# Patient Record
Sex: Female | Born: 1955 | Race: Asian | Hispanic: No | Marital: Married | Smoking: Never smoker
Health system: Southern US, Community
[De-identification: ages and names within clinical notes are randomized; demographics above are authoritative.]

## PROBLEM LIST (undated history)

## (undated) DIAGNOSIS — Z780 Asymptomatic menopausal state: Secondary | ICD-10-CM

## (undated) DIAGNOSIS — T753XXA Motion sickness, initial encounter: Secondary | ICD-10-CM

## (undated) DIAGNOSIS — G5600 Carpal tunnel syndrome, unspecified upper limb: Secondary | ICD-10-CM

## (undated) DIAGNOSIS — K219 Gastro-esophageal reflux disease without esophagitis: Secondary | ICD-10-CM

## (undated) DIAGNOSIS — Z972 Presence of dental prosthetic device (complete) (partial): Secondary | ICD-10-CM

## (undated) DIAGNOSIS — E039 Hypothyroidism, unspecified: Secondary | ICD-10-CM

## (undated) DIAGNOSIS — D649 Anemia, unspecified: Secondary | ICD-10-CM

## (undated) DIAGNOSIS — K589 Irritable bowel syndrome without diarrhea: Secondary | ICD-10-CM

## (undated) DIAGNOSIS — M1990 Unspecified osteoarthritis, unspecified site: Secondary | ICD-10-CM

## (undated) DIAGNOSIS — R0600 Dyspnea, unspecified: Secondary | ICD-10-CM

## (undated) HISTORY — PX: UPPER GI ENDOSCOPY: SHX6162

## (undated) HISTORY — DX: Asymptomatic menopausal state: Z78.0

## (undated) HISTORY — DX: Irritable bowel syndrome, unspecified: K58.9

## (undated) HISTORY — PX: APPENDECTOMY: SHX54

## (undated) HISTORY — DX: Carpal tunnel syndrome, unspecified upper limb: G56.00

---

## 2007-07-01 ENCOUNTER — Emergency Department: Payer: Self-pay | Admitting: Emergency Medicine

## 2010-02-04 ENCOUNTER — Emergency Department: Payer: Self-pay | Admitting: Emergency Medicine

## 2010-04-06 ENCOUNTER — Ambulatory Visit: Payer: Self-pay | Admitting: Gastroenterology

## 2010-04-06 LAB — HM COLONOSCOPY

## 2011-01-23 LAB — HM PAP SMEAR

## 2011-10-15 ENCOUNTER — Ambulatory Visit: Payer: Self-pay | Admitting: Family Medicine

## 2014-05-17 DIAGNOSIS — R0602 Shortness of breath: Secondary | ICD-10-CM | POA: Insufficient documentation

## 2014-05-17 DIAGNOSIS — E785 Hyperlipidemia, unspecified: Secondary | ICD-10-CM | POA: Insufficient documentation

## 2014-05-17 DIAGNOSIS — R002 Palpitations: Secondary | ICD-10-CM | POA: Insufficient documentation

## 2014-05-17 LAB — HM MAMMOGRAPHY

## 2015-06-22 ENCOUNTER — Other Ambulatory Visit: Payer: Self-pay | Admitting: Family Medicine

## 2015-06-22 DIAGNOSIS — E039 Hypothyroidism, unspecified: Secondary | ICD-10-CM

## 2015-06-22 NOTE — Telephone Encounter (Signed)
Due for physical in September. Needs appointment booked. Enough medicine sent in to get to her appointment in September.

## 2015-07-21 DIAGNOSIS — E039 Hypothyroidism, unspecified: Secondary | ICD-10-CM

## 2015-07-21 DIAGNOSIS — G5600 Carpal tunnel syndrome, unspecified upper limb: Secondary | ICD-10-CM

## 2015-07-21 DIAGNOSIS — Z780 Asymptomatic menopausal state: Secondary | ICD-10-CM | POA: Insufficient documentation

## 2015-07-21 DIAGNOSIS — K589 Irritable bowel syndrome without diarrhea: Secondary | ICD-10-CM | POA: Insufficient documentation

## 2015-07-22 ENCOUNTER — Ambulatory Visit (INDEPENDENT_AMBULATORY_CARE_PROVIDER_SITE_OTHER): Payer: No Typology Code available for payment source | Admitting: Unknown Physician Specialty

## 2015-07-22 ENCOUNTER — Encounter: Payer: Self-pay | Admitting: Unknown Physician Specialty

## 2015-07-22 VITALS — BP 135/84 | HR 85 | Temp 98.6°F | Ht 58.5 in | Wt 108.6 lb

## 2015-07-22 DIAGNOSIS — R1030 Lower abdominal pain, unspecified: Secondary | ICD-10-CM | POA: Diagnosis not present

## 2015-07-22 LAB — UA/M W/RFLX CULTURE, ROUTINE
Bilirubin, UA: NEGATIVE
Glucose, UA: NEGATIVE
Ketones, UA: NEGATIVE
LEUKOCYTES UA: NEGATIVE
Nitrite, UA: NEGATIVE
PH UA: 7.5 (ref 5.0–7.5)
PROTEIN UA: NEGATIVE
RBC UA: NEGATIVE
SPEC GRAV UA: 1.015 (ref 1.005–1.030)
Urobilinogen, Ur: 0.2 mg/dL (ref 0.2–1.0)

## 2015-07-22 LAB — CBC WITH DIFFERENTIAL/PLATELET
Hematocrit: 39.7 % (ref 34.0–46.6)
Hemoglobin: 13 g/dL (ref 11.1–15.9)
LYMPHS ABS: 2.9 10*3/uL (ref 0.7–3.1)
Lymphs: 35 %
MCH: 27.2 pg (ref 26.6–33.0)
MCHC: 32.7 g/dL (ref 31.5–35.7)
MCV: 83 fL (ref 79–97)
MID (ABSOLUTE): 0.8 10*3/uL (ref 0.1–1.6)
MID: 10 %
Neutrophils Absolute: 4.6 10*3/uL (ref 1.4–7.0)
Neutrophils: 56 %
PLATELETS: 248 10*3/uL (ref 150–379)
RBC: 4.78 x10E6/uL (ref 3.77–5.28)
RDW: 15.1 % (ref 12.3–15.4)
WBC: 8.3 10*3/uL (ref 3.4–10.8)

## 2015-07-22 MED ORDER — CIPROFLOXACIN HCL 500 MG PO TABS: 500.0000 mg | ORAL_TABLET | Freq: Two times a day (BID) | ORAL | Status: DC

## 2015-07-22 NOTE — Progress Notes (Signed)
   BP 135/84 mmHg  Pulse 85  Temp(Src) 98.6 F (37 C)  Ht 4' 10.5" (1.486 m)  Wt 108 lb 9.6 oz (49.261 kg)  BMI 22.31 kg/m2  SpO2 98%  LMP  (LMP Unknown)   Subjective:    Patient ID: Katherine [name], female    DOB: February 19, 1956, 59 y.o.   MRN: [id number]  HPI: Katherine [name] is a 59 y.o. female  Chief Complaint  Patient presents with  . Abdominal Pain    pt states pain started last Monday in lower abdominal area   Abdominal Pain This is a new problem. Episode onset: 2 weeks. The onset quality is gradual. The problem occurs constantly. The problem has been unchanged. The pain is located in the suprapubic region. The pain is moderate. The quality of the pain is aching. Pertinent negatives include no anorexia, constipation, diarrhea, frequency, myalgias, nausea or vomiting. The pain is aggravated by urination. Relieved by: "pain pills" The treatment provided mild relief.     Relevant past medical, surgical, family and social history reviewed and updated as indicated. Interim medical history since our last visit reviewed. Allergies and medications reviewed and updated.  Review of Systems  Gastrointestinal: Positive for abdominal pain. Negative for nausea, vomiting, diarrhea, constipation and anorexia.  Genitourinary: Negative for frequency.  Musculoskeletal: Negative for myalgias.    Per HPI unless specifically indicated above     Objective:    BP 135/84 mmHg  Pulse 85  Temp(Src) 98.6 F (37 C)  Ht 4' 10.5" (1.486 m)  Wt 108 lb 9.6 oz (49.261 kg)  BMI 22.31 kg/m2  SpO2 98%  LMP  (LMP Unknown)  Wt Readings from Last 3 Encounters:  07/22/15 108 lb 9.6 oz (49.261 kg)  08/26/14 117 lb (53.071 kg)    Physical Exam  Constitutional: She is oriented to person, place, and time. She appears well-developed and well-nourished. No distress.  HENT:  Head: Normocephalic and atraumatic.  Eyes: Conjunctivae and lids are normal. Right eye exhibits no discharge. Left eye exhibits  no discharge. No scleral icterus.  Cardiovascular: Normal rate, regular rhythm and normal heart sounds.   Pulmonary/Chest: Effort normal and breath sounds normal. No respiratory distress.  Abdominal: Soft. Normal appearance and bowel sounds are normal. She exhibits no distension. There is no splenomegaly or hepatomegaly. There is tenderness in the suprapubic area.  Genitourinary: Vagina normal. Uterus is tender. Cervix exhibits no motion tenderness, no discharge and no friability. Right adnexum displays no mass, no tenderness and no fullness. Left adnexum displays tenderness. Left adnexum displays no mass and no fullness.  Musculoskeletal: Normal range of motion.  Neurological: She is alert and oriented to person, place, and time.  Skin: Skin is intact. No rash noted. No pallor.  Psychiatric: She has a normal mood and affect. Her behavior is normal. Judgment and thought content normal.   Urine and CBC are WNL    Assessment & Plan:   Problem List Items Addressed This Visit    None    Visit Diagnoses    Lower abdominal pain    -  Primary    Normal CBC and UA.  Order pelvic US.  Rx for Cipro to r/o beginning diverticulitis or UTI    Relevant Orders    UA/M w/rflx Culture, Routine    CBC With Differential/Platelet    US Pelvis Complete        Follow up plan: No Follow-up on file.

## 2015-07-27 ENCOUNTER — Other Ambulatory Visit: Payer: Self-pay | Admitting: Unknown Physician Specialty

## 2015-07-27 ENCOUNTER — Telehealth: Payer: Self-pay | Admitting: Unknown Physician Specialty

## 2015-07-27 DIAGNOSIS — R102 Pelvic and perineal pain: Secondary | ICD-10-CM

## 2015-07-27 NOTE — Telephone Encounter (Signed)
Put the order in but the test number didn't work

## 2015-08-30 ENCOUNTER — Encounter: Payer: Self-pay | Admitting: Family Medicine

## 2015-08-30 ENCOUNTER — Ambulatory Visit (INDEPENDENT_AMBULATORY_CARE_PROVIDER_SITE_OTHER): Payer: No Typology Code available for payment source | Admitting: Family Medicine

## 2015-08-30 VITALS — BP 115/88 | HR 98 | Temp 98.6°F | Ht <= 58 in | Wt 107.0 lb

## 2015-08-30 DIAGNOSIS — Z0000 Encounter for general adult medical examination without abnormal findings: Secondary | ICD-10-CM | POA: Diagnosis not present

## 2015-08-30 DIAGNOSIS — R079 Chest pain, unspecified: Secondary | ICD-10-CM | POA: Diagnosis not present

## 2015-08-30 DIAGNOSIS — Z23 Encounter for immunization: Secondary | ICD-10-CM | POA: Diagnosis not present

## 2015-08-30 DIAGNOSIS — E875 Hyperkalemia: Secondary | ICD-10-CM | POA: Diagnosis not present

## 2015-08-30 DIAGNOSIS — F329 Major depressive disorder, single episode, unspecified: Secondary | ICD-10-CM | POA: Diagnosis not present

## 2015-08-30 DIAGNOSIS — F32A Depression, unspecified: Secondary | ICD-10-CM | POA: Insufficient documentation

## 2015-08-30 DIAGNOSIS — E039 Hypothyroidism, unspecified: Secondary | ICD-10-CM | POA: Diagnosis not present

## 2015-08-30 LAB — URINALYSIS, ROUTINE W REFLEX MICROSCOPIC
Bilirubin, UA: NEGATIVE
Glucose, UA: NEGATIVE
KETONES UA: NEGATIVE
LEUKOCYTES UA: NEGATIVE
NITRITE UA: NEGATIVE
PH UA: 7.5 (ref 5.0–7.5)
Protein, UA: NEGATIVE
RBC, UA: NEGATIVE
Specific Gravity, UA: 1.015 (ref 1.005–1.030)
UUROB: 0.2 mg/dL (ref 0.2–1.0)

## 2015-08-30 MED ORDER — LEVOTHYROXINE SODIUM 50 MCG PO TABS: 50.0000 ug | ORAL_TABLET | Freq: Every day | ORAL | Status: DC

## 2015-08-30 MED ORDER — CITALOPRAM HYDROBROMIDE 20 MG PO TABS: 20.0000 mg | ORAL_TABLET | Freq: Every day | ORAL | Status: DC

## 2015-08-30 NOTE — Assessment & Plan Note (Signed)
Discussed depression care and treatment will start citalopram Will need to recheck in 2 weeks

## 2015-08-30 NOTE — Assessment & Plan Note (Signed)
The current medical regimen is effective;  continue present plan and medications.  

## 2015-08-30 NOTE — Progress Notes (Signed)
BP 115/88 mmHg  Pulse 98  Temp(Src) 98.6 F (37 C)  Ht  (1.448 m)  Wt 107 lb (48.535 kg)  BMI 23.15 kg/m2  SpO2 98%  LMP  (LMP Unknown)   Subjective:    Patient ID: Katherine [name], female    DOB: 09/01/1956, 59 y.o.   MRN: [id number]  HPI: Katherine [name] is a 59 y.o. female  Chief Complaint  Patient presents with  . Annual Exam   Patient under a great deal of stress with marriage and husband's infidelity's as reported by patient. Has been married for 35 years Patient with chest pain, nausea lack of energy feeling depressed blue sad No suicidal ideation  Relevant past medical, surgical, family and social history reviewed and updated as indicated. Interim medical history since our last visit reviewed. Allergies and medications reviewed and updated.  Review of Systems  Constitutional: Negative.   HENT: Negative.   Eyes: Negative.   Respiratory: Negative.   Cardiovascular: Negative.   Gastrointestinal: Negative.   Endocrine: Negative.   Genitourinary: Negative.   Musculoskeletal: Negative.   Skin: Negative.   Allergic/Immunologic: Negative.   Neurological: Negative.   Hematological: Negative.   Psychiatric/Behavioral: Positive for sleep disturbance. Negative for suicidal ideas.    Per HPI unless specifically indicated above     Objective:    BP 115/88 mmHg  Pulse 98  Temp(Src) 98.6 F (37 C)  Ht  (1.448 m)  Wt 107 lb (48.535 kg)  BMI 23.15 kg/m2  SpO2 98%  LMP  (LMP Unknown)  Wt Readings from Last 3 Encounters:  08/30/15 107 lb (48.535 kg)  07/22/15 108 lb 9.6 oz (49.261 kg)  08/26/14 117 lb (53.071 kg)    Physical Exam  Constitutional: She is oriented to person, place, and time. She appears well-developed and well-nourished.  HENT:  Head: Normocephalic and atraumatic.  Right Ear: External ear normal.  Left Ear: External ear normal.  Nose: Nose normal.  Mouth/Throat: Oropharynx is clear and moist.  Eyes: Conjunctivae and EOM are  normal. Pupils are equal, round, and reactive to light.  Neck: Normal range of motion. Neck supple. Carotid bruit is not present.  Cardiovascular: Normal rate, regular rhythm and normal heart sounds.   No murmur heard. Pulmonary/Chest: Effort normal and breath sounds normal.  Abdominal: Soft. Bowel sounds are normal. There is no hepatosplenomegaly.  Musculoskeletal: Normal range of motion.  Neurological: She is alert and oriented to person, place, and time.  Skin: No rash noted.  Psychiatric: She has a normal mood and affect. Her behavior is normal. Judgment and thought content normal.    Results for orders placed or performed in visit on 08/30/15  Urinalysis, Routine w reflex microscopic (not at [facility])  Result Value Ref Range   Specific Gravity, UA 1.015 1.005 - 1.030   pH, UA 7.5 5.0 - 7.5   Color, UA Yellow Yellow   Appearance Ur Cloudy (A) Clear   Leukocytes, UA Negative Negative   Protein, UA Negative Negative/Trace   Glucose, UA Negative Negative   Ketones, UA Negative Negative   RBC, UA Negative Negative   Bilirubin, UA Negative Negative   Urobilinogen, Ur 0.2 0.2 - 1.0 mg/dL   Nitrite, UA Negative Negative      Assessment & Plan:   Problem List Items Addressed This Visit      Endocrine   Hypothyroid    The current medical regimen is effective;  continue present plan and medications.       Relevant  Medications   levothyroxine (SYNTHROID, LEVOTHROID) 50 MCG tablet   Other Relevant Orders   Comprehensive metabolic panel   Lipid panel   CBC with Differential/Platelet   Urinalysis, Routine w reflex microscopic (not at [facility]) (Completed)   TSH     Other   Depression    Discussed depression care and treatment will start citalopram Will need to recheck in 2 weeks      Relevant Medications   citalopram (CELEXA) 20 MG tablet   Other Relevant Orders   Comprehensive metabolic panel   Lipid panel   CBC with Differential/Platelet   Urinalysis, Routine w reflex  microscopic (not at [facility]) (Completed)   TSH   Chest pain    Chest pain with normal EKG secondary to stress anxiety depression       Other Visit Diagnoses    Immunization due    -  Primary    Relevant Orders    Flu Vaccine QUAD 36+ mos PF IM (Fluarix & Fluzone Quad PF) (Completed)    PE (physical exam), annual        Relevant Orders    Pap liquid-based and HPV (high risk)      EKG done on old machine as our current machine was broken and normal sinus rhythm with no acute changes  Follow up plan: Return in about 2 weeks (around 09/13/2015) for f/u depression.

## 2015-08-30 NOTE — Assessment & Plan Note (Signed)
Chest pain with normal EKG secondary to stress anxiety depression

## 2015-08-31 ENCOUNTER — Encounter: Payer: Self-pay | Admitting: Family Medicine

## 2015-08-31 LAB — COMPREHENSIVE METABOLIC PANEL
ALBUMIN: 4.2 g/dL (ref 3.5–5.5)
ALK PHOS: 91 IU/L (ref 39–117)
ALT: 14 IU/L (ref 0–32)
AST: 20 IU/L (ref 0–40)
Albumin/Globulin Ratio: 1.2 (ref 1.1–2.5)
BILIRUBIN TOTAL: 0.2 mg/dL (ref 0.0–1.2)
BUN / CREAT RATIO: 23 (ref 9–23)
BUN: 15 mg/dL (ref 6–24)
CHLORIDE: 102 mmol/L (ref 97–108)
CO2: 29 mmol/L (ref 18–29)
Calcium: 9.2 mg/dL (ref 8.7–10.2)
Creatinine, Ser: 0.66 mg/dL (ref 0.57–1.00)
GFR calc Af Amer: 114 mL/min/{1.73_m2} (ref >59)
GFR calc non Af Amer: 99 mL/min/{1.73_m2} (ref >59)
GLOBULIN, TOTAL: 3.5 g/dL (ref 1.5–4.5)
GLUCOSE: 98 mg/dL (ref 65–99)
Potassium: 5.5 mmol/L — ABNORMAL HIGH (ref 3.5–5.2)
SODIUM: 142 mmol/L (ref 134–144)
Total Protein: 7.7 g/dL (ref 6.0–8.5)

## 2015-08-31 LAB — CBC WITH DIFFERENTIAL/PLATELET
BASOS: 0 %
Basophils Absolute: 0 10*3/uL (ref 0.0–0.2)
EOS (ABSOLUTE): 0.1 10*3/uL (ref 0.0–0.4)
EOS: 1 %
HEMATOCRIT: 40.6 % (ref 34.0–46.6)
Hemoglobin: 12.9 g/dL (ref 11.1–15.9)
IMMATURE GRANS (ABS): 0 10*3/uL (ref 0.0–0.1)
IMMATURE GRANULOCYTES: 0 %
LYMPHS: 38 %
Lymphocytes Absolute: 3 10*3/uL (ref 0.7–3.1)
MCH: 26.5 pg — ABNORMAL LOW (ref 26.6–33.0)
MCHC: 31.8 g/dL (ref 31.5–35.7)
MCV: 83 fL (ref 79–97)
MONOCYTES: 7 %
Monocytes Absolute: 0.6 10*3/uL (ref 0.1–0.9)
NEUTROS PCT: 54 %
Neutrophils Absolute: 4.3 10*3/uL (ref 1.4–7.0)
Platelets: 254 10*3/uL (ref 150–379)
RBC: 4.87 x10E6/uL (ref 3.77–5.28)
RDW: 14.6 % (ref 12.3–15.4)
WBC: 8.1 10*3/uL (ref 3.4–10.8)

## 2015-08-31 LAB — LIPID PANEL
CHOLESTEROL TOTAL: 195 mg/dL (ref 100–199)
Chol/HDL Ratio: 3.5 ratio units (ref 0.0–4.4)
HDL: 56 mg/dL (ref >39)
LDL Calculated: 103 mg/dL — ABNORMAL HIGH (ref 0–99)
Triglycerides: 179 mg/dL — ABNORMAL HIGH (ref 0–149)
VLDL Cholesterol Cal: 36 mg/dL (ref 5–40)

## 2015-08-31 LAB — TSH: TSH: 2.31 u[IU]/mL (ref 0.450–4.500)

## 2015-08-31 NOTE — Addendum Note (Signed)
Addended byVonita Moss on: 08/31/2015 09:50 AM   Modules accepted: Orders, SmartSet

## 2015-09-02 LAB — HPV, LOW VOLUME (REFLEX): HPV, LOW VOL REFLEX: NEGATIVE

## 2015-09-03 LAB — PAP LB AND HPV HIGH-RISK: PAP Smear Comment: 0

## 2015-09-16 ENCOUNTER — Other Ambulatory Visit: Payer: Self-pay | Admitting: Family Medicine

## 2015-09-26 ENCOUNTER — Ambulatory Visit (INDEPENDENT_AMBULATORY_CARE_PROVIDER_SITE_OTHER): Payer: No Typology Code available for payment source | Admitting: Unknown Physician Specialty

## 2015-09-26 ENCOUNTER — Encounter: Payer: Self-pay | Admitting: Unknown Physician Specialty

## 2015-09-26 VITALS — BP 128/81 | HR 85 | Temp 99.1°F | Ht <= 58 in | Wt 106.2 lb

## 2015-09-26 DIAGNOSIS — F32A Depression, unspecified: Secondary | ICD-10-CM

## 2015-09-26 DIAGNOSIS — E039 Hypothyroidism, unspecified: Secondary | ICD-10-CM

## 2015-09-26 DIAGNOSIS — F329 Major depressive disorder, single episode, unspecified: Secondary | ICD-10-CM

## 2015-09-26 MED ORDER — CITALOPRAM HYDROBROMIDE 40 MG PO TABS: 40.0000 mg | ORAL_TABLET | Freq: Every day | ORAL | Status: DC

## 2015-09-26 MED ORDER — LEVOTHYROXINE SODIUM 50 MCG PO TABS: 50.0000 ug | ORAL_TABLET | Freq: Every day | ORAL | Status: DC
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
## 2015-10-25 ENCOUNTER — Ambulatory Visit (INDEPENDENT_AMBULATORY_CARE_PROVIDER_SITE_OTHER): Payer: No Typology Code available for payment source | Admitting: Family Medicine

## 2015-10-25 ENCOUNTER — Encounter: Payer: Self-pay | Admitting: Family Medicine

## 2015-10-25 VITALS — BP 114/69 | HR 68 | Temp 98.3°F | Ht <= 58 in | Wt 107.0 lb

## 2015-10-25 DIAGNOSIS — E039 Hypothyroidism, unspecified: Secondary | ICD-10-CM

## 2015-10-25 DIAGNOSIS — F329 Major depressive disorder, single episode, unspecified: Secondary | ICD-10-CM | POA: Diagnosis not present

## 2015-10-25 DIAGNOSIS — K589 Irritable bowel syndrome without diarrhea: Secondary | ICD-10-CM

## 2015-10-25 DIAGNOSIS — G5603 Carpal tunnel syndrome, bilateral upper limbs: Secondary | ICD-10-CM

## 2015-10-25 DIAGNOSIS — F32A Depression, unspecified: Secondary | ICD-10-CM

## 2015-10-25 MED ORDER — CITALOPRAM HYDROBROMIDE 40 MG PO TABS: 40.0000 mg | ORAL_TABLET | Freq: Every day | ORAL | Status: DC

## 2015-10-25 NOTE — Assessment & Plan Note (Signed)
Discussed depression care and treatment recommending continue medication through winter We'll give patient refills Discussed expectations with husband in some counseling done We'll give refill medications

## 2015-10-25 NOTE — Progress Notes (Signed)
BP 114/69 mmHg  Pulse 68  Temp(Src) 98.3 F (36.8 C)  Ht 4' 9.1" (1.45 m)  Wt 107 lb (48.535 kg)  BMI 23.08 kg/m2  SpO2 99%  LMP  (LMP Unknown)   Subjective:    Patient ID: Katherine [name], female    DOB: [birth year], 59 y.o.   MRN: [id number]  HPI: Katherine [name] is a 59 y.o. female  Chief Complaint  Patient presents with  . Depression   patient doing much better as decided to not let her husband determine how she feels is interested in stopping the Celexa discussed medication Patient's seeming much happier and better Family has noticed a difference  No suicidal ideation Eating better having more energy. Patient carpal tunnel symptoms seem to be worse Relevant past medical, surgical, family and social history reviewed and updated as indicated. Interim medical history since our last visit reviewed. Allergies and medications reviewed and updated.  Review of Systems  Constitutional: Negative.   Respiratory: Negative.   Cardiovascular: Negative.     Per HPI unless specifically indicated above     Objective:    BP 114/69 mmHg  Pulse 68  Temp(Src) 98.3 F (36.8 C)  Ht 4' 9.1" (1.45 m)  Wt 107 lb (48.535 kg)  BMI 23.08 kg/m2  SpO2 99%  LMP  (LMP Unknown)  Wt Readings from Last 3 Encounters:  10/25/15 107 lb (48.535 kg)  09/26/15 106 lb 3.2 oz (48.172 kg)  08/30/15 107 lb (48.535 kg)    Physical Exam  Constitutional: She is oriented to person, place, and time. She appears well-developed and well-nourished. No distress.  HENT:  Head: Normocephalic and atraumatic.  Right Ear: Hearing normal.  Left Ear: Hearing normal.  Nose: Nose normal.  Eyes: Conjunctivae and lids are normal. Right eye exhibits no discharge. Left eye exhibits no discharge. No scleral icterus.  Cardiovascular: Normal rate, regular rhythm and normal heart sounds.   Pulmonary/Chest: Effort normal and breath sounds normal. No respiratory distress.  Musculoskeletal: Normal range of motion.   Neurological: She is alert and oriented to person, place, and time.  Skin: Skin is intact. No rash noted.  Psychiatric: She has a normal mood and affect. Her speech is normal and behavior is normal. Judgment and thought content normal. Cognition and memory are normal.    Results for orders placed or performed in visit on 08/30/15  Comprehensive metabolic panel  Result Value Ref Range   Glucose 98 65 - 99 mg/dL   BUN 15 6 - 24 mg/dL   Creatinine, Ser 4.090.66 0.57 - 1.00 mg/dL   GFR calc non Af Amer 99 >59 mL/min/1.73   GFR calc Af Amer 114 >59 mL/min/1.73   BUN/Creatinine Ratio 23 9 - 23   Sodium 142 134 - 144 mmol/L   Potassium 5.5 (H) 3.5 - 5.2 mmol/L   Chloride 102 97 - 108 mmol/L   CO2 29 18 - 29 mmol/L   Calcium 9.2 8.7 - 10.2 mg/dL   Total Protein 7.7 6.0 - 8.5 g/dL   Albumin 4.2 3.5 - 5.5 g/dL   Globulin, Total 3.5 1.5 - 4.5 g/dL   Albumin/Globulin Ratio 1.2 1.1 - 2.5   Bilirubin Total 0.2 0.0 - 1.2 mg/dL   Alkaline Phosphatase 91 39 - 117 IU/L   AST 20 0 - 40 IU/L   ALT 14 0 - 32 IU/L  Lipid panel  Result Value Ref Range   Cholesterol, Total 195 100 - 199 mg/dL   Triglycerides 811179 (H) 0 -  149 mg/dL   HDL 56 >16 mg/dL   VLDL Cholesterol Cal 36 5 - 40 mg/dL   LDL Calculated 109 (H) 0 - 99 mg/dL   Chol/HDL Ratio 3.5 0.0 - 4.4 ratio units  CBC with Differential/Platelet  Result Value Ref Range   WBC 8.1 3.4 - 10.8 x10E3/uL   RBC 4.87 3.77 - 5.28 x10E6/uL   Hemoglobin 12.9 11.1 - 15.9 g/dL   Hematocrit 60.4 54.0 - 46.6 %   MCV 83 79 - 97 fL   MCH 26.5 (L) 26.6 - 33.0 pg   MCHC 31.8 31.5 - 35.7 g/dL   RDW 98.1 19.1 - 47.8 %   Platelets 254 150 - 379 x10E3/uL   Neutrophils 54 %   Lymphs 38 %   Monocytes 7 %   Eos 1 %   Basos 0 %   Neutrophils Absolute 4.3 1.4 - 7.0 x10E3/uL   Lymphocytes Absolute 3.0 0.7 - 3.1 x10E3/uL   Monocytes Absolute 0.6 0.1 - 0.9 x10E3/uL   EOS (ABSOLUTE) 0.1 0.0 - 0.4 x10E3/uL   Basophils Absolute 0.0 0.0 - 0.2 x10E3/uL   Immature  Granulocytes 0 %   Immature Grans (Abs) 0.0 0.0 - 0.1 x10E3/uL  Urinalysis, Routine w reflex microscopic (not at [facility])  Result Value Ref Range   Specific Gravity, UA 1.015 1.005 - 1.030   pH, UA 7.5 5.0 - 7.5   Color, UA Yellow Yellow   Appearance Ur Cloudy (A) Clear   Leukocytes, UA Negative Negative   Protein, UA Negative Negative/Trace   Glucose, UA Negative Negative   Ketones, UA Negative Negative   RBC, UA Negative Negative   Bilirubin, UA Negative Negative   Urobilinogen, Ur 0.2 0.2 - 1.0 mg/dL   Nitrite, UA Negative Negative  TSH  Result Value Ref Range   TSH 2.310 0.450 - 4.500 uIU/mL  Pap liquid-based and HPV (high risk)  Result Value Ref Range   DIAGNOSIS: Comment    Specimen adequacy: Comment    CLINICIAN PROVIDED ICD10: Comment    Performed by: Comment    PAP SMEAR COMMENT .    Note: Comment   HPV, low volume (reflex)  Result Value Ref Range   HPV low volume reflex Negative Negative      Assessment & Plan:   Problem List Items Addressed This Visit      Digestive   IBS (irritable bowel syndrome)    Much improved        Endocrine   Hypothyroid    Taking medications in stable        Nervous and Auditory   Carpal tunnel syndrome    Discuss using splinting and care of wrists      Relevant Medications   citalopram (CELEXA) 40 MG tablet     Other   Depression - Primary    Discussed depression care and treatment recommending continue medication through winter We'll give patient refills Discussed expectations with husband in some counseling done We'll give refill medications      Relevant Medications   citalopram (CELEXA) 40 MG tablet       Follow up plan: Return in about 3 months (around 01/24/2016) for Recheck medications.

## 2015-10-25 NOTE — Assessment & Plan Note (Signed)
Discuss using splinting and care of wrists

## 2015-10-25 NOTE — Assessment & Plan Note (Signed)
Much improved

## 2015-10-25 NOTE — Assessment & Plan Note (Signed)
Taking medications in stable

## 2015-10-26 ENCOUNTER — Ambulatory Visit: Payer: No Typology Code available for payment source | Admitting: Unknown Physician Specialty

## 2016-06-15 ENCOUNTER — Encounter: Payer: Self-pay | Admitting: Family Medicine

## 2016-06-15 ENCOUNTER — Ambulatory Visit (INDEPENDENT_AMBULATORY_CARE_PROVIDER_SITE_OTHER): Payer: BLUE CROSS/BLUE SHIELD | Admitting: Family Medicine

## 2016-06-15 VITALS — BP 127/82 | HR 79 | Temp 98.6°F | Wt 111.0 lb

## 2016-06-15 DIAGNOSIS — M2550 Pain in unspecified joint: Secondary | ICD-10-CM

## 2016-06-15 MED ORDER — MELOXICAM 7.5 MG PO TABS: 7.5000 mg | ORAL_TABLET | Freq: Every day | ORAL | Status: DC

## 2016-06-15 NOTE — Patient Instructions (Signed)
Follow up if no improvement 

## 2016-06-15 NOTE — Progress Notes (Signed)
BP 127/82 mmHg  Pulse 79  Temp(Src) 98.6 F (37 C)  Wt 111 lb (50.349 kg)  SpO2 99%  LMP  (LMP Unknown)   Subjective:    Patient ID: Katherine [name], female    DOB: [birth year], 58 y.o.   MRN: [id number]  HPI: Katherine [name] is a 60 y.o. female  Chief Complaint  Patient presents with  . Arthritis    x 1 month. right pinky finger; she has carpal tunnel in both hands, states at night her fingers will go numb. Also pain in shoulders, knees, toes. Sometimes her whole body will "go numb". She was taking Tylenol arthritis but it did not help.   New onset joint pain for about 1 month now. Hx of CTS in b/l hands, but now having joint pain in almost all of her joints. Swelling of right 5th digit intermittently. States all joints are about the same amount of discomfort. Worst at night, especially after a lot of activity. Has tried icy hot and tylenol arthritis.  Denies fever, chills, HA. Sometimes feels fatigued.  Has had several tick bites the past 4-5 months. No rash noticed.   Relevant past medical, surgical, family and social history reviewed and updated as indicated. Interim medical history since our last visit reviewed. Allergies and medications reviewed and updated.  Review of Systems  Constitutional: Positive for fatigue. Negative for fever and chills.  HENT: Negative.   Respiratory: Negative.   Cardiovascular: Negative.   Gastrointestinal: Negative.   Musculoskeletal: Negative.   Skin: Negative.   Neurological: Negative.   Psychiatric/Behavioral: Negative.     Per HPI unless specifically indicated above     Objective:    BP 127/82 mmHg  Pulse 79  Temp(Src) 98.6 F (37 C)  Wt 111 lb (50.349 kg)  SpO2 99%  LMP  (LMP Unknown)  Wt Readings from Last 3 Encounters:  06/15/16 111 lb (50.349 kg)  10/25/15 107 lb (48.535 kg)  09/26/15 106 lb 3.2 oz (48.172 kg)    Physical Exam  Constitutional: She is oriented to person, place, and time. She appears well-developed  and well-nourished. No distress.  HENT:  Head: Atraumatic.  Eyes: Conjunctivae are normal.  Neck: Normal range of motion. Neck supple.  Cardiovascular: Normal rate.   Pulmonary/Chest: Effort normal.  Musculoskeletal: Normal range of motion. She exhibits edema (left 5th digit). She exhibits no tenderness.  Neurological: She is alert and oriented to person, place, and time.  Skin: Skin is warm and dry. No rash noted.  Psychiatric: She has a normal mood and affect. Her behavior is normal.  Nursing note and vitals reviewed.       Assessment & Plan:   Problem List Items Addressed This Visit    None    Visit Diagnoses    Joint pain    -  Primary    Relevant Orders    Rocky mtn spotted fvr ab, IgG-blood    Lyme Ab/Western Blot Reflex      Likely arthritis, but will r/o tick-bourne illness. Sent in meloxicam after discussing potential allergy issue as she is allergic to ASA (itching and stomach upset). States she has done well with ibuprofen and advil before and is wanting to try medicine. Discussed that she can also take tylenol over the counter but no other over the counter medicines. Continue icy hot and heat/cold as tolerated.  Patient to follow up in about a month if no better for further evaluation, including rhematologic workup/possible referral.    Follow up  plan: No Follow-up on file.

## 2016-06-20 ENCOUNTER — Telehealth: Payer: Self-pay | Admitting: Family Medicine

## 2016-06-20 LAB — LYME AB/WESTERN BLOT REFLEX: Lyme IgG/IgM Ab: <0.91 {ISR} (ref 0.00–0.90)

## 2016-06-20 LAB — ROCKY MTN SPOTTED FVR AB, IGG-BLOOD: RMSF IgG: POSITIVE — AB

## 2016-06-20 LAB — RMSF, IGG, IFA

## 2016-06-20 MED ORDER — DOXYCYCLINE HYCLATE 100 MG PO TABS: 100.0000 mg | ORAL_TABLET | Freq: Two times a day (BID) | ORAL | 0 refills | Status: DC

## 2016-06-20 NOTE — Telephone Encounter (Signed)
Left message to call.

## 2016-06-20 NOTE — Telephone Encounter (Signed)
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 Remind her to limit sun exposure and wear lots of sunscreen if outside while on the medication. Thanks

## 2016-06-20 NOTE — Telephone Encounter (Signed)
Left generic message to call about her labs and that there is an rx for her to pick up at her pharmacy.

## 2016-06-21 NOTE — Telephone Encounter (Signed)
Left message to call.

## 2016-06-21 NOTE — Telephone Encounter (Signed)
[place]/PP signed. Results released to patient's daughter. She will explain it to her mother and have her pick up the rx.

## 2016-09-19 ENCOUNTER — Other Ambulatory Visit: Payer: Self-pay | Admitting: Family Medicine

## 2016-09-19 ENCOUNTER — Other Ambulatory Visit: Payer: Self-pay

## 2016-09-19 MED ORDER — LEVOTHYROXINE SODIUM 50 MCG PO TABS: 50.0000 ug | ORAL_TABLET | Freq: Every day | ORAL | 1 refills | Status: DC

## 2016-09-19 NOTE — Telephone Encounter (Signed)
Patient request a refill on Thyroid medication Levothyroxine 50 MCG tablet.  Please send to [facility] in [place].

## 2016-09-19 NOTE — Addendum Note (Signed)
Addended byClaudine Mouton: WORKMAN, AMY J on: 09/19/2016 02:34 PM   Modules accepted: Orders

## 2016-09-19 NOTE — Telephone Encounter (Signed)
Routing to provider. No follow up scheduled. 

## 2016-09-19 NOTE — Telephone Encounter (Signed)
Apt pe 

## 2016-10-02 ENCOUNTER — Other Ambulatory Visit: Payer: Self-pay | Admitting: Family Medicine

## 2016-10-02 ENCOUNTER — Encounter: Payer: Self-pay | Admitting: Family Medicine

## 2016-10-02 DIAGNOSIS — E039 Hypothyroidism, unspecified: Secondary | ICD-10-CM

## 2016-10-02 NOTE — Telephone Encounter (Signed)
Unable to get an appt scheduled by phone, letter sent

## 2016-11-17 ENCOUNTER — Other Ambulatory Visit: Payer: Self-pay | Admitting: Family Medicine

## 2017-01-11 ENCOUNTER — Encounter: Payer: Self-pay | Admitting: Family Medicine

## 2017-01-11 ENCOUNTER — Ambulatory Visit (INDEPENDENT_AMBULATORY_CARE_PROVIDER_SITE_OTHER): Payer: BLUE CROSS/BLUE SHIELD | Admitting: Family Medicine

## 2017-01-11 VITALS — BP 125/79 | HR 67 | Temp 98.2°F | Ht <= 58 in | Wt 113.0 lb

## 2017-01-11 DIAGNOSIS — J101 Influenza due to other identified influenza virus with other respiratory manifestations: Secondary | ICD-10-CM

## 2017-01-11 DIAGNOSIS — J029 Acute pharyngitis, unspecified: Secondary | ICD-10-CM

## 2017-01-11 LAB — VERITOR FLU A/B WAIVED
INFLUENZA A: POSITIVE — AB
INFLUENZA B: NEGATIVE

## 2017-01-11 MED ORDER — DICLOFENAC SODIUM 1 % TD GEL: 4.0000 g | Freq: Four times a day (QID) | TRANSDERMAL | 11 refills | Status: DC

## 2017-01-11 MED ORDER — OSELTAMIVIR PHOSPHATE 75 MG PO CAPS: 75.0000 mg | ORAL_CAPSULE | Freq: Two times a day (BID) | ORAL | 0 refills | Status: DC

## 2017-01-11 MED ORDER — ALBUTEROL SULFATE HFA 108 (90 BASE) MCG/ACT IN AERS: 2.0000 | INHALATION_SPRAY | Freq: Four times a day (QID) | RESPIRATORY_TRACT | 0 refills | Status: DC | PRN

## 2017-01-11 NOTE — Progress Notes (Signed)
BP 125/79   Pulse 67   Temp 98.2 F (36.8 C) (Oral)   Ht 4\' 10"  (1.473 m)   Wt 113 lb (51.3 kg)   LMP  (LMP Unknown)   SpO2 98%   BMI 23.62 kg/m    Subjective:    Patient ID: Katherine [name], female    DOB: 1956-06-20, 61 y.o.   MRN: [id number]  HPI: Katherine [name] is a 61 y.o. female  Chief Complaint  Patient presents with  . Sore Throat  . Generalized Body Aches    x 3 days.    Sore throat, rhinorrhea, eye discharge, body aches, and cough. May have had a fever last night, had sweats and chills. Does have some SOB and wheezing in the evenings. Taking tylenol cold and sinus, not helping. Daughter has been sick as well.   Past Medical History:  Diagnosis Date  . Carpal tunnel syndrome   . IBS (irritable bowel syndrome)   . Menopause    Social History   Social History  . Marital status: Married    Spouse name: N/A  . Number of children: N/A  . Years of education: N/A   Occupational History  . Not on file.   Social History Main Topics  . Smoking status: Never Smoker  . Smokeless tobacco: Never Used  . Alcohol use No  . Drug use: No  . Sexual activity: No   Other Topics Concern  . Not on file   Social History Narrative  . No narrative on file    Relevant past medical, surgical, family and social history reviewed and updated as indicated. Interim medical history since our last visit reviewed. Allergies and medications reviewed and updated.  Review of Systems  Constitutional: Positive for chills and diaphoresis.  HENT: Positive for rhinorrhea and sore throat.   Eyes: Positive for discharge.  Respiratory: Positive for cough, shortness of breath and wheezing.   Cardiovascular: Negative.   Gastrointestinal: Negative.   Genitourinary: Negative.   Musculoskeletal: Positive for myalgias.  Neurological: Negative.   Psychiatric/Behavioral: Negative.     Per HPI unless specifically indicated above     Objective:    BP 125/79   Pulse 67   Temp  98.2 F (36.8 C) (Oral)   Ht 4\' 10"  (1.473 m)   Wt 113 lb (51.3 kg)   LMP  (LMP Unknown)   SpO2 98%   BMI 23.62 kg/m   Wt Readings from Last 3 Encounters:  01/11/17 113 lb (51.3 kg)  06/15/16 111 lb (50.3 kg)  10/25/15 107 lb (48.5 kg)    Physical Exam  Constitutional: She is oriented to person, place, and time. She appears well-developed and well-nourished. No distress.  HENT:  Head: Atraumatic.  Right Ear: External ear normal.  Left Ear: External ear normal.  Nasal mucosa injected Mild middle ear effusion b/l   Eyes: Conjunctivae are normal. Pupils are equal, round, and reactive to light. No scleral icterus.  Neck: Normal range of motion. Neck supple.  Cardiovascular: Normal rate and normal heart sounds.   Pulmonary/Chest: Effort normal and breath sounds normal. No respiratory distress. She has no wheezes. She has no rales.  Musculoskeletal: Normal range of motion.  Lymphadenopathy:    She has cervical adenopathy.  Neurological: She is alert and oriented to person, place, and time.  Skin: Skin is warm and dry.  Psychiatric: She has a normal mood and affect. Her behavior is normal.  Nursing note and vitals reviewed.     Assessment &  Plan:   Problem List Items Addressed This Visit    None    Visit Diagnoses    Influenza A    -  Primary   Tamiflu and abluterol inhaler sent. Sudafed, delsym, tylenol and ibuprofen prn. Supportive care discussed. F/u if worsening or no improvement   Relevant Medications   oseltamivir (TAMIFLU) 75 MG capsule   Other Relevant Orders   Veritor Flu A/B Waived   Sore throat       Relevant Orders   Veritor Flu A/B Waived       Follow up plan: Return if symptoms worsen or fail to improve.

## 2017-01-11 NOTE — Patient Instructions (Signed)
Follow up as needed

## 2017-01-13 ENCOUNTER — Other Ambulatory Visit: Payer: Self-pay | Admitting: Family Medicine

## 2017-02-21 ENCOUNTER — Encounter: Payer: Self-pay | Admitting: Family Medicine

## 2017-02-21 ENCOUNTER — Ambulatory Visit (INDEPENDENT_AMBULATORY_CARE_PROVIDER_SITE_OTHER): Payer: BLUE CROSS/BLUE SHIELD | Admitting: Family Medicine

## 2017-02-21 VITALS — BP 126/81 | HR 74 | Temp 98.7°F | Wt 113.0 lb

## 2017-02-21 DIAGNOSIS — R21 Rash and other nonspecific skin eruption: Secondary | ICD-10-CM

## 2017-02-21 MED ORDER — CLOBETASOL PROPIONATE 0.05 % EX CREA: 1.0000 "application " | TOPICAL_CREAM | Freq: Two times a day (BID) | CUTANEOUS | 0 refills | Status: DC

## 2017-02-21 NOTE — Progress Notes (Signed)
   BP 126/81   Pulse 74   Temp 98.7 F (37.1 C)   Wt 113 lb (51.3 kg)   LMP  (LMP Unknown)   SpO2 99%   BMI 23.62 kg/m    Subjective:    Patient ID: Katherine [name], female    DOB: Aug 10, 1956, 61 y.o.   MRN: [id number]  HPI: Katherine [name] is a 61 y.o. female  Chief Complaint  Patient presents with  . Tick Bite    noticed it last Friday, was above her left breast. She developed a rash all around her upper arm. Itches. Some headache, No flu like symptoms, no fever.    Patient presents with itchy rash after a witnessed tick bite on left chest 1 week ago. Area is swollen and severely itchy, and now having some sporadic areas of rash come up around that area. Also note some HAs and malaise, but states they felt fairly migrainous to her. Denies fever, body aches. Hx of RMSF less than a year ago, treated with doxy with good relief at the time.   Relevant past medical, surgical, family and social history reviewed and updated as indicated. Interim medical history since our last visit reviewed. Allergies and medications reviewed and updated.  Review of Systems  Constitutional: Positive for fatigue.  HENT: Negative.   Respiratory: Negative.   Cardiovascular: Negative.   Gastrointestinal: Negative.   Musculoskeletal: Negative.   Skin: Positive for rash.  Neurological: Positive for headaches.  Psychiatric/Behavioral: Negative.     Per HPI unless specifically indicated above     Objective:    BP 126/81   Pulse 74   Temp 98.7 F (37.1 C)   Wt 113 lb (51.3 kg)   LMP  (LMP Unknown)   SpO2 99%   BMI 23.62 kg/m   Wt Readings from Last 3 Encounters:  02/21/17 113 lb (51.3 kg)  01/11/17 113 lb (51.3 kg)  06/15/16 111 lb (50.3 kg)    Physical Exam  Constitutional: She is oriented to person, place, and time. She appears well-developed and well-nourished.  HENT:  Head: Atraumatic.  Eyes: Conjunctivae are normal. Pupils are equal, round, and reactive to light.  Neck: Normal  range of motion. Neck supple.  Cardiovascular: Normal rate and normal heart sounds.   Pulmonary/Chest: Effort normal and breath sounds normal. No respiratory distress.  Musculoskeletal: Normal range of motion.  Neurological: She is alert and oriented to person, place, and time.  Skin: Skin is warm and dry. Rash (Bite location erythematous and edematous with entry wound visualized. Multiple discrete patches of erythematous irritated skin across left chest and shoulder nearby ) noted.  Psychiatric: She has a normal mood and affect. Her behavior is normal.  Nursing note and vitals reviewed.      Assessment & Plan:   Problem List Items Addressed This Visit    None    Visit Diagnoses    Tick bite, initial encounter    -  Primary   Given rash and HAs, will check RMSF and lyme labs. Clobetasol given for sx relief in meantime. Monitor closely, tylenol or ibuprofen prn for generalized sxs   Relevant Orders   Rocky mtn spotted fvr abs pnl(IgG+IgM)   Lyme Disease, IgM, Early Test w/ Rflx       Follow up plan: No Follow-up on file.

## 2017-02-21 NOTE — Patient Instructions (Signed)
Follow up as needed

## 2017-02-25 ENCOUNTER — Telehealth: Payer: Self-pay | Admitting: Family Medicine

## 2017-02-25 LAB — RMSF, IGG, IFA: RMSF, IGG, IFA: 1:64 {titer} — ABNORMAL HIGH

## 2017-02-25 LAB — ROCKY MTN SPOTTED FVR ABS PNL(IGG+IGM)
RMSF IGG: POSITIVE — AB
RMSF IGM: 1.18 {index} — AB (ref 0.00–0.89)

## 2017-02-25 LAB — LYME, IGM, EARLY TEST/REFLEX: LYME DISEASE AB, QUANT, IGM: <0.8 index (ref 0.00–0.79)

## 2017-02-25 MED ORDER — DOXYCYCLINE HYCLATE 100 MG PO TABS: 100.0000 mg | ORAL_TABLET | Freq: Two times a day (BID) | ORAL | 0 refills | Status: DC

## 2017-02-25 NOTE — Telephone Encounter (Signed)
Please call pt and let her know that her lyme disease test came back negative, still waiting on her RMSF test but think it would be best to go ahead and treat her as the test isn't 100% accurate. I have sent doxycycline over to her pharmacy for her to start.

## 2017-02-25 NOTE — Telephone Encounter (Signed)
Patient notified

## 2017-04-16 ENCOUNTER — Ambulatory Visit (INDEPENDENT_AMBULATORY_CARE_PROVIDER_SITE_OTHER): Payer: BLUE CROSS/BLUE SHIELD | Admitting: Family Medicine

## 2017-04-16 ENCOUNTER — Encounter: Payer: Self-pay | Admitting: Family Medicine

## 2017-04-16 VITALS — BP 104/69 | HR 77 | Ht 58.66 in | Wt 114.0 lb

## 2017-04-16 DIAGNOSIS — Z0001 Encounter for general adult medical examination with abnormal findings: Secondary | ICD-10-CM | POA: Diagnosis not present

## 2017-04-16 DIAGNOSIS — E039 Hypothyroidism, unspecified: Secondary | ICD-10-CM | POA: Diagnosis not present

## 2017-04-16 DIAGNOSIS — Z1231 Encounter for screening mammogram for malignant neoplasm of breast: Secondary | ICD-10-CM | POA: Diagnosis not present

## 2017-04-16 DIAGNOSIS — Z0000 Encounter for general adult medical examination without abnormal findings: Secondary | ICD-10-CM

## 2017-04-16 DIAGNOSIS — G629 Polyneuropathy, unspecified: Secondary | ICD-10-CM | POA: Diagnosis not present

## 2017-04-16 DIAGNOSIS — Z79899 Other long term (current) drug therapy: Secondary | ICD-10-CM | POA: Diagnosis not present

## 2017-04-16 DIAGNOSIS — Z13220 Encounter for screening for lipoid disorders: Secondary | ICD-10-CM | POA: Diagnosis not present

## 2017-04-16 DIAGNOSIS — Z1239 Encounter for other screening for malignant neoplasm of breast: Secondary | ICD-10-CM

## 2017-04-16 DIAGNOSIS — Z1329 Encounter for screening for other suspected endocrine disorder: Secondary | ICD-10-CM

## 2017-04-16 LAB — MICROSCOPIC EXAMINATION
Bacteria, UA: NONE SEEN
RBC MICROSCOPIC, UA: NONE SEEN /HPF (ref 0–?)

## 2017-04-16 LAB — URINALYSIS, ROUTINE W REFLEX MICROSCOPIC
Bilirubin, UA: NEGATIVE
GLUCOSE, UA: NEGATIVE
Ketones, UA: NEGATIVE
LEUKOCYTES UA: NEGATIVE
NITRITE UA: NEGATIVE
Protein, UA: NEGATIVE
RBC, UA: NEGATIVE
Specific Gravity, UA: <1.005 — ABNORMAL LOW (ref 1.005–1.030)
Urobilinogen, Ur: 0.2 mg/dL (ref 0.2–1.0)
pH, UA: 7 (ref 5.0–7.5)

## 2017-04-16 MED ORDER — LEVOTHYROXINE SODIUM 50 MCG PO TABS: 50.0000 ug | ORAL_TABLET | Freq: Every day | ORAL | 12 refills | Status: DC

## 2017-04-16 MED ORDER — MELOXICAM 15 MG PO TABS: 15.0000 mg | ORAL_TABLET | Freq: Every day | ORAL | 3 refills | Status: DC

## 2017-04-16 NOTE — Assessment & Plan Note (Signed)
Nonspecific neuropathy discussed changing positions for work stress and will check thyroid status. Trial of meloxicam If not helping will consider physical therapy and/or referrals.

## 2017-04-16 NOTE — Progress Notes (Signed)
BP 104/69   Pulse 77   Ht 4' 10.66" (1.49 m)   Wt 114 lb (51.7 kg)   LMP  (LMP Unknown)   SpO2 99%   BMI 23.29 kg/m    Subjective:    Patient ID: Katherine [name], female    DOB: [birth year], 61 y.o.   MRN: [id number]  HPI: Katherine [name] is a 61 y.o. female  Chief Complaint  Patient presents with  . Annual Exam  . Flank Pain    Right side, shoulder to foot.   For the last 3-4 months patient's right side from elbow shoulder down the right flank into the right lateral leg to the right lateral 3 toes having numbness. This is not gotten worse over the last 3-4 months.  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 Patient also with some numbness in her hand medial 3 fingers.   Relevant past medical, surgical, family and social history reviewed and updated as indicated. Interim medical history since our last visit reviewed. Allergies and medications reviewed and updated.  Review of Systems  Constitutional: Negative.   HENT: Negative.   Eyes: Negative.   Respiratory: Negative.   Cardiovascular: Negative.   Gastrointestinal: Negative.   Endocrine: Negative.   Genitourinary: Negative.   Musculoskeletal: Negative.   Skin: Negative.   Allergic/Immunologic: Negative.   Neurological: Negative.   Hematological: Negative.   Psychiatric/Behavioral: Negative.     Per HPI unless specifically indicated above     Objective:    BP 104/69   Pulse 77   Ht 4' 10.66" (1.49 m)   Wt 114 lb (51.7 kg)   LMP  (LMP Unknown)   SpO2 99%   BMI 23.29 kg/m   Wt Readings from Last 3 Encounters:  04/16/17 114 lb (51.7 kg)  02/21/17 113 lb (51.3 kg)  01/11/17 113 lb (51.3 kg)    Physical Exam  Constitutional: She is oriented to person, place, and time. She appears well-developed and well-nourished.  HENT:  Head: Normocephalic and atraumatic.  Right Ear: External ear normal.  Left Ear:  External ear normal.  Nose: Nose normal.  Mouth/Throat: Oropharynx is clear and moist.  Eyes: Conjunctivae and EOM are normal. Pupils are equal, round, and reactive to light.  Neck: Normal range of motion. Neck supple. Carotid bruit is not present.  Cardiovascular: Normal rate, regular rhythm and normal heart sounds.   No murmur heard. Pulmonary/Chest: Effort normal and breath sounds normal. She exhibits no mass. Right breast exhibits no mass, no skin change and no tenderness. Left breast exhibits no mass, no skin change and no tenderness. Breasts are symmetrical.  Abdominal: Soft. Bowel sounds are normal. There is no hepatosplenomegaly.  Musculoskeletal: Normal range of motion.  Neurological: She is alert and oriented to person, place, and time.  Straight leg raising on the right side re-creates radicular symptoms  Skin: No rash noted.  Psychiatric: She has a normal mood and affect. Her behavior is normal. Judgment and thought content normal.        Assessment & Plan:   Problem List Items Addressed This Visit      Endocrine   Hypothyroid    Low thyroid may be contributing to patient's symptoms will check      Relevant Orders   CBC with Differential/Platelet   Comprehensive metabolic panel   TSH   Urinalysis, Routine w reflex microscopic     Nervous  and Auditory   Neuropathy    Nonspecific neuropathy discussed changing positions for work stress and will check thyroid status. Trial of meloxicam If not helping will consider physical therapy and/or referrals.       Other Visit Diagnoses    Annual physical exam    -  Primary   Relevant Orders   CBC with Differential/Platelet   Comprehensive metabolic panel   Lipid panel   TSH   Urinalysis, Routine w reflex microscopic   Screening cholesterol level       Relevant Orders   Lipid panel   Thyroid disorder screen       Relevant Orders   TSH   Medication management       Relevant Orders   Comprehensive metabolic panel     Breast cancer screening       Relevant Orders   MM DIGITAL SCREENING BILATERAL       Follow up plan: Return in about 4 weeks (around 05/14/2017) for Recheck radicular symptoms.

## 2017-04-16 NOTE — Assessment & Plan Note (Signed)
Low thyroid may be contributing to patient's symptoms will check

## 2017-04-17 ENCOUNTER — Encounter: Payer: Self-pay | Admitting: Family Medicine

## 2017-04-17 LAB — COMPREHENSIVE METABOLIC PANEL
A/G RATIO: 1.1 — AB (ref 1.2–2.2)
ALBUMIN: 4 g/dL (ref 3.5–5.5)
ALK PHOS: 98 IU/L (ref 39–117)
ALT: 16 IU/L (ref 0–32)
AST: 20 IU/L (ref 0–40)
BUN / CREAT RATIO: 19 (ref 9–23)
BUN: 13 mg/dL (ref 6–24)
CHLORIDE: 100 mmol/L (ref 96–106)
CO2: 28 mmol/L (ref 18–29)
Calcium: 9 mg/dL (ref 8.7–10.2)
Creatinine, Ser: 0.7 mg/dL (ref 0.57–1.00)
GFR calc Af Amer: 110 mL/min/{1.73_m2} (ref >59)
GFR calc non Af Amer: 96 mL/min/{1.73_m2} (ref >59)
GLOBULIN, TOTAL: 3.5 g/dL (ref 1.5–4.5)
Glucose: 106 mg/dL — ABNORMAL HIGH (ref 65–99)
POTASSIUM: 4.3 mmol/L (ref 3.5–5.2)
SODIUM: 139 mmol/L (ref 134–144)
Total Protein: 7.5 g/dL (ref 6.0–8.5)

## 2017-04-17 LAB — CBC WITH DIFFERENTIAL/PLATELET
Basophils Absolute: 0 10*3/uL (ref 0.0–0.2)
Basos: 0 %
EOS (ABSOLUTE): 0.2 10*3/uL (ref 0.0–0.4)
EOS: 3 %
HEMATOCRIT: 38.7 % (ref 34.0–46.6)
HEMOGLOBIN: 12.4 g/dL (ref 11.1–15.9)
Immature Grans (Abs): 0 10*3/uL (ref 0.0–0.1)
Immature Granulocytes: 0 %
LYMPHS ABS: 2.6 10*3/uL (ref 0.7–3.1)
Lymphs: 38 %
MCH: 26.2 pg — ABNORMAL LOW (ref 26.6–33.0)
MCHC: 32 g/dL (ref 31.5–35.7)
MCV: 82 fL (ref 79–97)
MONOCYTES: 8 %
MONOS ABS: 0.6 10*3/uL (ref 0.1–0.9)
NEUTROS ABS: 3.4 10*3/uL (ref 1.4–7.0)
Neutrophils: 51 %
Platelets: 243 10*3/uL (ref 150–379)
RBC: 4.73 x10E6/uL (ref 3.77–5.28)
RDW: 15.1 % (ref 12.3–15.4)
WBC: 6.8 10*3/uL (ref 3.4–10.8)

## 2017-04-17 LAB — LIPID PANEL
CHOLESTEROL TOTAL: 174 mg/dL (ref 100–199)
Chol/HDL Ratio: 3.5 ratio (ref 0.0–4.4)
HDL: 50 mg/dL (ref >39)
LDL CALC: 81 mg/dL (ref 0–99)
Triglycerides: 217 mg/dL — ABNORMAL HIGH (ref 0–149)
VLDL Cholesterol Cal: 43 mg/dL — ABNORMAL HIGH (ref 5–40)

## 2017-04-17 LAB — TSH: TSH: 1.48 u[IU]/mL (ref 0.450–4.500)

## 2017-05-14 ENCOUNTER — Encounter: Payer: Self-pay | Admitting: Family Medicine

## 2017-05-23 ENCOUNTER — Emergency Department: Payer: BLUE CROSS/BLUE SHIELD

## 2017-05-23 ENCOUNTER — Encounter: Payer: Self-pay | Admitting: Emergency Medicine

## 2017-05-23 ENCOUNTER — Encounter: Payer: Self-pay | Admitting: Family Medicine

## 2017-05-23 ENCOUNTER — Emergency Department
Admission: EM | Admit: 2017-05-23 | Discharge: 2017-05-23 | Disposition: A | Discharge status: Home / Self Care | Payer: BLUE CROSS/BLUE SHIELD | Attending: Emergency Medicine | Admitting: Emergency Medicine

## 2017-05-23 ENCOUNTER — Ambulatory Visit (INDEPENDENT_AMBULATORY_CARE_PROVIDER_SITE_OTHER): Payer: BLUE CROSS/BLUE SHIELD | Admitting: Family Medicine

## 2017-05-23 DIAGNOSIS — G8929 Other chronic pain: Secondary | ICD-10-CM

## 2017-05-23 DIAGNOSIS — Z79899 Other long term (current) drug therapy: Secondary | ICD-10-CM | POA: Diagnosis not present

## 2017-05-23 DIAGNOSIS — Y92410 Unspecified street and highway as the place of occurrence of the external cause: Secondary | ICD-10-CM | POA: Diagnosis not present

## 2017-05-23 DIAGNOSIS — S161XXA Strain of muscle, fascia and tendon at neck level, initial encounter: Secondary | ICD-10-CM

## 2017-05-23 DIAGNOSIS — Y939 Activity, unspecified: Secondary | ICD-10-CM | POA: Diagnosis not present

## 2017-05-23 DIAGNOSIS — S29019A Strain of muscle and tendon of unspecified wall of thorax, initial encounter: Secondary | ICD-10-CM

## 2017-05-23 DIAGNOSIS — E039 Hypothyroidism, unspecified: Secondary | ICD-10-CM | POA: Insufficient documentation

## 2017-05-23 DIAGNOSIS — M545 Low back pain, unspecified: Secondary | ICD-10-CM | POA: Insufficient documentation

## 2017-05-23 DIAGNOSIS — S39012A Strain of muscle, fascia and tendon of lower back, initial encounter: Secondary | ICD-10-CM | POA: Diagnosis not present

## 2017-05-23 DIAGNOSIS — S199XXA Unspecified injury of neck, initial encounter: Secondary | ICD-10-CM | POA: Diagnosis present

## 2017-05-23 DIAGNOSIS — M7551 Bursitis of right shoulder: Secondary | ICD-10-CM | POA: Diagnosis not present

## 2017-05-23 DIAGNOSIS — Y999 Unspecified external cause status: Secondary | ICD-10-CM | POA: Diagnosis not present

## 2017-05-23 DIAGNOSIS — M5441 Lumbago with sciatica, right side: Secondary | ICD-10-CM | POA: Diagnosis not present

## 2017-05-23 MED ORDER — TRAMADOL HCL 50 MG PO TABS: ORAL_TABLET | ORAL | 0 refills | Status: DC

## 2017-05-23 MED ORDER — ONDANSETRON 4 MG PO TBDP
4.0000 mg | ORAL_TABLET | Freq: Once | ORAL | Status: AC
  Administered 2017-05-23: 4 mg via ORAL
  Filled 2017-05-23: qty 1

## 2017-05-23 NOTE — Assessment & Plan Note (Signed)
Discuss back pain care extension exercises proper posture Tylenol
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
## 2017-05-23 NOTE — Assessment & Plan Note (Signed)
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 Patient has had problems with Advil and Aleve meloxicam bothering her stomach patient will use Tylenol

## 2017-05-23 NOTE — Progress Notes (Signed)
BP 132/83   Pulse 78   Wt 113 lb (51.3 kg)   LMP  (LMP Unknown)   SpO2 99%   BMI 23.09 kg/m    Subjective:    Patient ID: Katherine [name], female    DOB: Apr 10, 1956, 61 y.o.   MRN: [id number]  HPI: Katherine [name] is a 61 y.o. 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 Patient also has some right back pain radiating down into the L2-3 area.  Relevant past medical, surgical, family and social history reviewed and updated as indicated. Interim medical history since our last visit reviewed. Allergies and medications reviewed and updated.  Review of Systems  Constitutional: Negative.   Respiratory: Negative.   Cardiovascular: Negative.     Per HPI unless specifically indicated above     Objective:    BP 132/83   Pulse 78   Wt 113 lb (51.3 kg)   LMP  (LMP Unknown)   SpO2 99%   BMI 23.09 kg/m   Wt Readings from Last 3 Encounters:  05/23/17 113 lb (51.3 kg)  04/16/17 114 lb (51.7 kg)  02/21/17 113 lb (51.3 kg)    Physical Exam  Constitutional: She is oriented to person, place, and time. She appears well-developed and well-nourished.  HENT:  Head: Normocephalic and atraumatic.  Eyes: Conjunctivae and EOM are normal.  Neck: Normal range of motion.  Cardiovascular: Normal rate, regular rhythm and normal heart sounds.   Pulmonary/Chest: Effort normal and breath sounds normal.  Musculoskeletal: Normal range of motion.  Right shoulder with changes bursitis area was prepped with Betadine and alcohol and infiltrated with Marcaine and Kenalog with relief of symptoms. Patient tolerated procedure well.  Back straight leg raising DTRs discuss skeletal on normal though with some radicular-like symptoms.  Neurological: She is alert and  oriented to person, place, and time.  Skin: No erythema.  Psychiatric: She has a normal mood and affect. Her behavior is normal. Judgment and thought content normal.    Results for orders placed or performed in visit on 04/16/17  Microscopic Examination  Result Value Ref Range   WBC, UA 0-5 0 - 5 /hpf   RBC, UA None seen 0 - 2 /hpf   Epithelial Cells (non renal) 0-10 0 - 10 /hpf   Bacteria, UA None seen None seen/Few  CBC with Differential/Platelet  Result Value Ref Range   WBC 6.8 3.4 - 10.8 x10E3/uL   RBC 4.73 3.77 - 5.28 x10E6/uL   Hemoglobin 12.4 11.1 - 15.9 g/dL   Hematocrit 40.9 81.1 - 46.6 %   MCV 82 79 - 97 fL   MCH 26.2 (L) 26.6 - 33.0 pg   MCHC 32.0 31.5 - 35.7 g/dL   RDW 91.4 78.2 - 95.6 %   Platelets 243 150 - 379 x10E3/uL   Neutrophils 51 Not Estab. %   Lymphs 38 Not Estab. %   Monocytes 8 Not Estab. %   Eos 3 Not Estab. %   Basos 0 Not Estab. %   Neutrophils Absolute 3.4 1.4 - 7.0 x10E3/uL   Lymphocytes Absolute 2.6 0.7 - 3.1 x10E3/uL   Monocytes Absolute 0.6 0.1 - 0.9 x10E3/uL   EOS (ABSOLUTE) 0.2 0.0 - 0.4 x10E3/uL   Basophils Absolute 0.0 0.0 - 0.2 x10E3/uL  Immature Granulocytes 0 Not Estab. %   Immature Grans (Abs) 0.0 0.0 - 0.1 x10E3/uL  Comprehensive metabolic panel  Result Value Ref Range   Glucose 106 (H) 65 - 99 mg/dL   BUN 13 6 - 24 mg/dL   Creatinine, Ser 1.610.70 0.57 - 1.00 mg/dL   GFR calc non Af Amer 96 >59 mL/min/1.73   GFR calc Af Amer 110 >59 mL/min/1.73   BUN/Creatinine Ratio 19 9 - 23   Sodium 139 134 - 144 mmol/L   Potassium 4.3 3.5 - 5.2 mmol/L   Chloride 100 96 - 106 mmol/L   CO2 28 18 - 29 mmol/L   Calcium 9.0 8.7 - 10.2 mg/dL   Total Protein 7.5 6.0 - 8.5 g/dL   Albumin 4.0 3.5 - 5.5 g/dL   Globulin, Total 3.5 1.5 - 4.5 g/dL   Albumin/Globulin Ratio 1.1 (L) 1.2 - 2.2   Bilirubin Total <0.2 0.0 - 1.2 mg/dL   Alkaline Phosphatase 98 39 - 117 IU/L   AST 20 0 - 40 IU/L   ALT 16 0 - 32 IU/L  Lipid panel  Result Value Ref Range     Cholesterol, Total 174 100 - 199 mg/dL   Triglycerides 096217 (H) 0 - 149 mg/dL   HDL 50 >04>39 mg/dL   VLDL Cholesterol Cal 43 (H) 5 - 40 mg/dL   LDL Calculated 81 0 - 99 mg/dL   Chol/HDL Ratio 3.5 0.0 - 4.4 ratio  TSH  Result Value Ref Range   TSH 1.480 0.450 - 4.500 uIU/mL  Urinalysis, Routine w reflex microscopic  Result Value Ref Range   Specific Gravity, UA <1.005 (L) 1.005 - 1.030   pH, UA 7.0 5.0 - 7.5   Color, UA Yellow Yellow   Appearance Ur Clear Clear   Leukocytes, UA Negative Negative   Protein, UA Negative Negative/Trace   Glucose, UA Negative Negative   Ketones, UA Negative Negative   RBC, UA Negative Negative   Bilirubin, UA Negative Negative   Urobilinogen, Ur 0.2 0.2 - 1.0 mg/dL   Nitrite, UA Negative Negative   Microscopic Examination See below:       Assessment & Plan:   Problem List Items Addressed This Visit      Musculoskeletal and Integument   Acute shoulder bursitis, right    Responded to injection Asian education on needing to change job so that does not become recurrent. Patient has had problems with Advil and Aleve meloxicam bothering her stomach patient will use Tylenol        Other   Low back pain    Discuss back pain care extension exercises proper posture Tylenol          Follow up plan: Return if symptoms worsen or fail to improve, for As scheduled.

## 2017-05-23 NOTE — ED Notes (Signed)
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
## 2017-05-23 NOTE — Discharge Instructions (Signed)
Follow-up with your  primary care doctor if any continued problems. Take tramadol one half to one tablet every 6 hours as needed for pain. This medication can cause drowsiness and increase your risk for falling. You may also take Tylenol or ibuprofen with this medication if needed. Use ice or heat to muscles as needed for comfort.

## 2017-05-27 ENCOUNTER — Ambulatory Visit: Payer: BLUE CROSS/BLUE SHIELD | Admitting: Family Medicine

## 2017-05-27 ENCOUNTER — Telehealth: Payer: Self-pay | Admitting: Family Medicine

## 2017-05-27 NOTE — Telephone Encounter (Signed)
Patient scheduled.

## 2017-05-27 NOTE — Telephone Encounter (Signed)
Patient was needing to be seen today. I have called the patients daughter back but only received VM.  I asked them to call us back ASAP as we have an add on appt

## 2017-05-28 ENCOUNTER — Emergency Department
Admission: EM | Admit: 2017-05-28 | Discharge: 2017-05-28 | Disposition: A | Discharge status: Home / Self Care | Payer: BLUE CROSS/BLUE SHIELD | Attending: Emergency Medicine | Admitting: Emergency Medicine

## 2017-05-28 ENCOUNTER — Emergency Department: Payer: BLUE CROSS/BLUE SHIELD

## 2017-05-28 ENCOUNTER — Encounter: Payer: Self-pay | Admitting: *Deleted

## 2017-05-28 DIAGNOSIS — K625 Hemorrhage of anus and rectum: Secondary | ICD-10-CM | POA: Insufficient documentation

## 2017-05-28 DIAGNOSIS — Z79899 Other long term (current) drug therapy: Secondary | ICD-10-CM | POA: Diagnosis not present

## 2017-05-28 DIAGNOSIS — E039 Hypothyroidism, unspecified: Secondary | ICD-10-CM | POA: Insufficient documentation

## 2017-05-28 DIAGNOSIS — R1030 Lower abdominal pain, unspecified: Secondary | ICD-10-CM | POA: Insufficient documentation

## 2017-05-28 LAB — COMPREHENSIVE METABOLIC PANEL
ALBUMIN: 4 g/dL (ref 3.5–5.0)
ALK PHOS: 98 U/L (ref 38–126)
ALT: 18 U/L (ref 14–54)
AST: 26 U/L (ref 15–41)
Anion gap: 6 (ref 5–15)
BILIRUBIN TOTAL: 0.6 mg/dL (ref 0.3–1.2)
BUN: 17 mg/dL (ref 6–20)
CALCIUM: 9.2 mg/dL (ref 8.9–10.3)
CO2: 29 mmol/L (ref 22–32)
CREATININE: 0.67 mg/dL (ref 0.44–1.00)
Chloride: 103 mmol/L (ref 101–111)
GFR calc Af Amer: >60 mL/min (ref >60)
GFR calc non Af Amer: >60 mL/min (ref >60)
GLUCOSE: 181 mg/dL — AB (ref 65–99)
Potassium: 3.9 mmol/L (ref 3.5–5.1)
SODIUM: 138 mmol/L (ref 135–145)
TOTAL PROTEIN: 8.1 g/dL (ref 6.5–8.1)

## 2017-05-28 LAB — PROTIME-INR
INR: 0.92
PROTHROMBIN TIME: 12.4 s (ref 11.4–15.2)

## 2017-05-28 LAB — CBC
HCT: 40.5 % (ref 35.0–47.0)
Hemoglobin: 13.3 g/dL (ref 12.0–16.0)
MCH: 26.2 pg (ref 26.0–34.0)
MCHC: 32.8 g/dL (ref 32.0–36.0)
MCV: 79.8 fL — ABNORMAL LOW (ref 80.0–100.0)
PLATELETS: 250 10*3/uL (ref 150–440)
RBC: 5.07 MIL/uL (ref 3.80–5.20)
RDW: 14.5 % (ref 11.5–14.5)
WBC: 9.7 10*3/uL (ref 3.6–11.0)

## 2017-05-28 LAB — APTT: aPTT: 34 seconds (ref 24–36)

## 2017-05-28 LAB — HEMOGLOBIN AND HEMATOCRIT, BLOOD
HCT: 39.5 % (ref 35.0–47.0)
Hemoglobin: 13.2 g/dL (ref 12.0–16.0)

## 2017-05-28 MED ORDER — SODIUM CHLORIDE 0.9 % IV BOLUS (SEPSIS)
500.0000 mL | Freq: Once | INTRAVENOUS | Status: AC
  Administered 2017-05-28: 500 mL via INTRAVENOUS

## 2017-05-28 MED ORDER — ESOMEPRAZOLE MAGNESIUM 20 MG PO CPDR: 20.0000 mg | DELAYED_RELEASE_CAPSULE | Freq: Every day | ORAL | 1 refills | Status: DC

## 2017-05-28 MED ORDER — IOPAMIDOL (ISOVUE-300) INJECTION 61%
100.0000 mL | Freq: Once | INTRAVENOUS | Status: AC | PRN
  Administered 2017-05-28: 100 mL via INTRAVENOUS

## 2017-05-28 MED ORDER — IOPAMIDOL (ISOVUE-300) INJECTION 61%
30.0000 mL | Freq: Once | INTRAVENOUS | Status: AC | PRN
  Administered 2017-05-28: 30 mL via ORAL

## 2017-05-28 NOTE — ED Notes (Signed)

## 2017-05-28 NOTE — ED Triage Notes (Signed)
Pt reports blood in stools twice today.  Pt has lower abd pain and low back pain  No urinary sx. Pt alert.  No vomiting
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
## 2017-05-31 ENCOUNTER — Ambulatory Visit (INDEPENDENT_AMBULATORY_CARE_PROVIDER_SITE_OTHER): Payer: BLUE CROSS/BLUE SHIELD | Admitting: Unknown Physician Specialty

## 2017-05-31 ENCOUNTER — Encounter: Payer: Self-pay | Admitting: Unknown Physician Specialty

## 2017-05-31 VITALS — BP 134/76 | HR 69 | Temp 98.5°F | Wt 110.8 lb

## 2017-05-31 DIAGNOSIS — R42 Dizziness and giddiness: Secondary | ICD-10-CM

## 2017-05-31 DIAGNOSIS — M542 Cervicalgia: Secondary | ICD-10-CM | POA: Diagnosis not present

## 2017-05-31 DIAGNOSIS — M549 Dorsalgia, unspecified: Secondary | ICD-10-CM | POA: Diagnosis not present

## 2017-05-31 LAB — CBC WITH DIFFERENTIAL/PLATELET
Hematocrit: 43.2 % (ref 34.0–46.6)
Hemoglobin: 13.8 g/dL (ref 11.1–15.9)
LYMPHS: 28 %
Lymphocytes Absolute: 2.6 10*3/uL (ref 0.7–3.1)
MCH: 26 pg — AB (ref 26.6–33.0)
MCHC: 31.9 g/dL (ref 31.5–35.7)
MCV: 81 fL (ref 79–97)
MID (Absolute): 0.8 10*3/uL (ref 0.1–1.6)
MID: 8 %
NEUTROS ABS: 5.9 10*3/uL (ref 1.4–7.0)
NEUTROS PCT: 64 %
PLATELETS: 286 10*3/uL (ref 150–379)
RBC: 5.31 x10E6/uL — AB (ref 3.77–5.28)
RDW: 14.8 % (ref 12.3–15.4)
WBC: 9.3 10*3/uL (ref 3.4–10.8)

## 2017-05-31 MED ORDER — TRAMADOL HCL 50 MG PO TABS: 50.0000 mg | ORAL_TABLET | Freq: Three times a day (TID) | ORAL | 0 refills | Status: DC | PRN

## 2017-05-31 MED ORDER — CYCLOBENZAPRINE HCL 10 MG PO TABS: 10.0000 mg | ORAL_TABLET | Freq: Three times a day (TID) | ORAL | 0 refills | Status: DC | PRN
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
## 2017-06-10 ENCOUNTER — Ambulatory Visit: Payer: BLUE CROSS/BLUE SHIELD | Admitting: Physical Therapy

## 2017-06-13 ENCOUNTER — Ambulatory Visit: Payer: BLUE CROSS/BLUE SHIELD | Admitting: Physical Therapy

## 2017-06-14 ENCOUNTER — Ambulatory Visit (INDEPENDENT_AMBULATORY_CARE_PROVIDER_SITE_OTHER): Payer: BLUE CROSS/BLUE SHIELD | Admitting: Unknown Physician Specialty

## 2017-06-14 ENCOUNTER — Encounter: Payer: Self-pay | Admitting: Unknown Physician Specialty

## 2017-06-14 VITALS — BP 123/79 | HR 82 | Temp 98.2°F | Wt 110.2 lb

## 2017-06-14 DIAGNOSIS — M5441 Lumbago with sciatica, right side: Secondary | ICD-10-CM | POA: Diagnosis not present

## 2017-06-14 DIAGNOSIS — M542 Cervicalgia: Secondary | ICD-10-CM | POA: Diagnosis not present

## 2017-06-14 DIAGNOSIS — G8929 Other chronic pain: Secondary | ICD-10-CM | POA: Diagnosis not present

## 2017-06-14 NOTE — Assessment & Plan Note (Addendum)
Acute on chronic pain.  Note that she is unable to lift anything over 5 pounds, bend or stand for prolonged periods of time
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
## 2017-06-17 ENCOUNTER — Ambulatory Visit: Payer: BLUE CROSS/BLUE SHIELD | Attending: Unknown Physician Specialty | Admitting: Physical Therapy

## 2017-06-17 ENCOUNTER — Encounter: Payer: Self-pay | Admitting: Physical Therapy

## 2017-06-17 ENCOUNTER — Ambulatory Visit: Payer: BLUE CROSS/BLUE SHIELD | Admitting: Physical Therapy

## 2017-06-17 DIAGNOSIS — S134XXA Sprain of ligaments of cervical spine, initial encounter: Secondary | ICD-10-CM | POA: Diagnosis present

## 2017-06-17 DIAGNOSIS — M549 Dorsalgia, unspecified: Secondary | ICD-10-CM | POA: Insufficient documentation

## 2017-06-17 DIAGNOSIS — M542 Cervicalgia: Secondary | ICD-10-CM | POA: Diagnosis not present

## 2017-06-17 DIAGNOSIS — M791 Myalgia, unspecified site: Secondary | ICD-10-CM

## 2017-06-17 DIAGNOSIS — X58XXXA Exposure to other specified factors, initial encounter: Secondary | ICD-10-CM | POA: Diagnosis not present
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
## 2017-06-24 ENCOUNTER — Ambulatory Visit: Payer: BLUE CROSS/BLUE SHIELD | Admitting: Physical Therapy

## 2017-06-24 ENCOUNTER — Encounter: Payer: Self-pay | Admitting: Physical Therapy

## 2017-06-24 DIAGNOSIS — M791 Myalgia, unspecified site: Secondary | ICD-10-CM

## 2017-06-24 DIAGNOSIS — S134XXA Sprain of ligaments of cervical spine, initial encounter: Secondary | ICD-10-CM
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
## 2017-07-01 ENCOUNTER — Encounter: Payer: Self-pay | Admitting: Physical Therapy

## 2017-07-01 ENCOUNTER — Ambulatory Visit: Payer: BLUE CROSS/BLUE SHIELD | Attending: Unknown Physician Specialty | Admitting: Physical Therapy

## 2017-07-01 DIAGNOSIS — S134XXA Sprain of ligaments of cervical spine, initial encounter: Secondary | ICD-10-CM | POA: Insufficient documentation

## 2017-07-01 DIAGNOSIS — M791 Myalgia, unspecified site: Secondary | ICD-10-CM
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
## 2017-07-08 ENCOUNTER — Encounter: Payer: Self-pay | Admitting: Physical Therapy

## 2017-07-08 ENCOUNTER — Ambulatory Visit: Payer: BLUE CROSS/BLUE SHIELD | Admitting: Physical Therapy

## 2017-07-08 DIAGNOSIS — S134XXA Sprain of ligaments of cervical spine, initial encounter: Secondary | ICD-10-CM

## 2017-07-08 DIAGNOSIS — M791 Myalgia, unspecified site: Secondary | ICD-10-CM
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
## 2017-07-09 ENCOUNTER — Encounter: Payer: Self-pay | Admitting: Unknown Physician Specialty

## 2017-07-09 ENCOUNTER — Ambulatory Visit (INDEPENDENT_AMBULATORY_CARE_PROVIDER_SITE_OTHER): Payer: BLUE CROSS/BLUE SHIELD | Admitting: Unknown Physician Specialty

## 2017-07-09 VITALS — BP 131/78 | HR 75 | Temp 98.6°F | Wt 111.6 lb

## 2017-07-09 DIAGNOSIS — J4521 Mild intermittent asthma with (acute) exacerbation: Secondary | ICD-10-CM

## 2017-07-09 DIAGNOSIS — R0602 Shortness of breath: Secondary | ICD-10-CM

## 2017-07-09 DIAGNOSIS — J45909 Unspecified asthma, uncomplicated: Secondary | ICD-10-CM | POA: Insufficient documentation

## 2017-07-09 DIAGNOSIS — M542 Cervicalgia: Secondary | ICD-10-CM | POA: Diagnosis not present

## 2017-07-09 MED ORDER — BUDESONIDE-FORMOTEROL FUMARATE 160-4.5 MCG/ACT IN AERO: 2.0000 | INHALATION_SPRAY | Freq: Two times a day (BID) | RESPIRATORY_TRACT | 0 refills | Status: DC

## 2017-07-09 NOTE — Progress Notes (Signed)
BP 131/78   Pulse 75   Temp 98.6 F (37 C)   Wt 111 lb 9.6 oz (50.6 kg)   LMP  (LMP Unknown)   SpO2 98%   BMI 23.32 kg/m    Subjective:    Patient ID: Katherine [name], female    DOB: 1956-09-06, 61 y.o.   MRN: [id number]  HPI: Katherine [name] is a 61 y.o. female  Chief Complaint  Patient presents with  . Neck Pain    pt states PT is helping some  . Shortness of Breath    pt states that she has been experiencing SOB and chest tightness after walking since yesterday    Neck pain Improving with physical therapy.  States they are working her hard.    Shortness of breath Pt having SOB with walking.  States she gets tired and trouble breathing. She does have an inhaler she uses on occasion.  This comes and goes and worse when she walks.  She has to walk slow.     Relevant past medical, surgical, family and social history reviewed and updated as indicated. Interim medical history since our last visit reviewed. Allergies and medications reviewed and updated.  Review of Systems  Per HPI unless specifically indicated above     Objective:    BP 131/78   Pulse 75   Temp 98.6 F (37 C)   Wt 111 lb 9.6 oz (50.6 kg)   LMP  (LMP Unknown)   SpO2 98%   BMI 23.32 kg/m   Wt Readings from Last 3 Encounters:  07/09/17 111 lb 9.6 oz (50.6 kg)  06/14/17 110 lb 3.2 oz (50 kg)  05/31/17 110 lb 12.8 oz (50.3 kg)    Physical Exam  Constitutional: She is oriented to person, place, and time. She appears well-developed and well-nourished. No distress.  HENT:  Head: Normocephalic and atraumatic.  Eyes: Conjunctivae and lids are normal. Right eye exhibits no discharge. Left eye exhibits no discharge. No scleral icterus.  Neck: Normal range of motion. Neck supple. No JVD present. Carotid bruit is not present.  Cardiovascular: Normal rate, regular rhythm and normal heart sounds.   Pulmonary/Chest: Effort normal and breath sounds normal.  Abdominal: Normal appearance. There is no  splenomegaly or hepatomegaly.  Musculoskeletal: Normal range of motion.  Neurological: She is alert and oriented to person, place, and time.  Skin: Skin is warm, dry and intact. No rash noted. No pallor.  Psychiatric: She has a normal mood and affect. Her behavior is normal. Judgment and thought content normal.   EKG NSR without STTW changes.    Results for orders placed or performed in visit on 05/31/17  CBC With Differential/Platelet  Result Value Ref Range   WBC 9.3 3.4 - 10.8 x10E3/uL   RBC 5.31 (H) 3.77 - 5.28 x10E6/uL   Hemoglobin 13.8 11.1 - 15.9 g/dL   Hematocrit 40.9 81.1 - 46.6 %   MCV 81 79 - 97 fL   MCH 26.0 (L) 26.6 - 33.0 pg   MCHC 31.9 31.5 - 35.7 g/dL   RDW 91.4 78.2 - 95.6 %   Platelets 286 150 - 379 x10E3/uL   Neutrophils 64 Not Estab. %   Lymphs 28 Not Estab. %   MID 8 Not Estab. %   Neutrophils Absolute 5.9 1.4 - 7.0 x10E3/uL   Lymphocytes Absolute 2.6 0.7 - 3.1 x10E3/uL   MID (Absolute) 0.8 0.1 - 1.6 X10E3/uL      Assessment & Plan:   Problem List Items  Addressed This Visit      Unprioritized   Asthma   Relevant Medications   budesonide-formoterol (SYMBICORT) 160-4.5 MCG/ACT inhaler    Other Visit Diagnoses    SOB (shortness of breath)    -  Primary   EKG normal.  Sample of Symbicort to take 2 puffs twice a day.  Refer to cardiology for stress testing.     Relevant Orders   EKG 12-Lead (Completed)   CBC With Differential/Platelet   Neck pain       Continue present treatment.  Slow resolution of symptoms       Follow up plan: Return if symptoms worsen or fail to improve.

## 2017-07-09 NOTE — Patient Instructions (Signed)
Symbicort 2 puffs twice as day

## 2017-07-10 ENCOUNTER — Ambulatory Visit: Payer: BLUE CROSS/BLUE SHIELD | Admitting: Unknown Physician Specialty

## 2017-07-10 LAB — CBC WITH DIFFERENTIAL/PLATELET
HEMATOCRIT: 40.4 % (ref 34.0–46.6)
HEMOGLOBIN: 12.9 g/dL (ref 11.1–15.9)
Lymphocytes Absolute: 2.5 10*3/uL (ref 0.7–3.1)
Lymphs: 37 %
MCH: 26.4 pg — AB (ref 26.6–33.0)
MCHC: 31.9 g/dL (ref 31.5–35.7)
MCV: 83 fL (ref 79–97)
MID (ABSOLUTE): 0.6 10*3/uL (ref 0.1–1.6)
MID: 8 %
Neutrophils Absolute: 3.7 10*3/uL (ref 1.4–7.0)
Neutrophils: 55 %
PLATELETS: 271 10*3/uL (ref 150–379)
RBC: 4.89 x10E6/uL (ref 3.77–5.28)
RDW: 15.6 % — ABNORMAL HIGH (ref 12.3–15.4)
WBC: 6.8 10*3/uL (ref 3.4–10.8)

## 2017-07-15 ENCOUNTER — Encounter: Payer: BLUE CROSS/BLUE SHIELD | Admitting: Physical Therapy

## 2017-07-22 ENCOUNTER — Encounter: Payer: BLUE CROSS/BLUE SHIELD | Admitting: Physical Therapy

## 2017-09-27 ENCOUNTER — Encounter: Payer: Self-pay | Admitting: Unknown Physician Specialty

## 2017-09-27 ENCOUNTER — Ambulatory Visit (INDEPENDENT_AMBULATORY_CARE_PROVIDER_SITE_OTHER): Payer: BLUE CROSS/BLUE SHIELD | Admitting: Unknown Physician Specialty

## 2017-09-27 VITALS — BP 149/88 | HR 78 | Temp 98.5°F | Wt 113.6 lb

## 2017-09-27 DIAGNOSIS — J4521 Mild intermittent asthma with (acute) exacerbation: Secondary | ICD-10-CM | POA: Diagnosis not present

## 2017-09-27 DIAGNOSIS — R05 Cough: Secondary | ICD-10-CM | POA: Diagnosis not present

## 2017-09-27 DIAGNOSIS — R059 Cough, unspecified: Secondary | ICD-10-CM

## 2017-09-27 DIAGNOSIS — J069 Acute upper respiratory infection, unspecified: Secondary | ICD-10-CM

## 2017-09-27 MED ORDER — BENZONATATE 200 MG PO CAPS: 200.0000 mg | ORAL_CAPSULE | Freq: Two times a day (BID) | ORAL | 0 refills | Status: DC | PRN

## 2017-09-27 MED ORDER — PREDNISONE 20 MG PO TABS: 40.0000 mg | ORAL_TABLET | Freq: Every day | ORAL | 0 refills | Status: DC

## 2017-09-27 NOTE — Assessment & Plan Note (Signed)
Feels like she is having a flare but never feels her breathing is good.  Get chest x-ray.  Prednisone

## 2017-09-27 NOTE — Progress Notes (Signed)
BP (!) 149/88   Pulse 78   Temp 98.5 F (36.9 C)   Wt 113 lb 9.6 oz (51.5 kg)   LMP  (LMP Unknown)   SpO2 98%   BMI 23.74 kg/m    Subjective:    Patient ID: Katherine [name], female    DOB: [birth year], 61 y.o.   MRN: [id number]  HPI: Katherine [name] is a 61 y.o. female  Chief Complaint  Patient presents with  . URI    pt states she has had a cough and congestion for about a week. States she has been taking cough syrup with no relief. Also states that her back hurts when she coughs   URI   This is a new problem. The current episode started in the past 7 days. The problem has been gradually worsening. There has been no fever. Associated symptoms include chest pain, coughing and rhinorrhea. Associated symptoms comments: Pulled muscle on riht side with couging. Treatments tried: cough medication. The treatment provided no relief.  She is taking inhalers.  Still has trouble breathing despite inhalers  Relevant past medical, surgical, family and social history reviewed and updated as indicated. Interim medical history since our last visit reviewed. Allergies and medications reviewed and updated.  Review of Systems  HENT: Positive for rhinorrhea.   Respiratory: Positive for cough.   Cardiovascular: Positive for chest pain.    Per HPI unless specifically indicated above     Objective:    BP (!) 149/88   Pulse 78   Temp 98.5 F (36.9 C)   Wt 113 lb 9.6 oz (51.5 kg)   LMP  (LMP Unknown)   SpO2 98%   BMI 23.74 kg/m   Wt Readings from Last 3 Encounters:  09/27/17 113 lb 9.6 oz (51.5 kg)  07/09/17 111 lb 9.6 oz (50.6 kg)  06/14/17 110 lb 3.2 oz (50 kg)    Physical Exam  Constitutional: She is oriented to person, place, and time. She appears well-developed and well-nourished. No distress.  HENT:  Head: Normocephalic and atraumatic.  Right Ear: Tympanic membrane and ear canal normal.  Left Ear: Tympanic membrane and ear canal normal.  Nose: Rhinorrhea present. Right  sinus exhibits no maxillary sinus tenderness and no frontal sinus tenderness. Left sinus exhibits no maxillary sinus tenderness and no frontal sinus tenderness.  Mouth/Throat: Mucous membranes are normal. Posterior oropharyngeal erythema present.  Eyes: Conjunctivae and lids are normal. Right eye exhibits no discharge. Left eye exhibits no discharge. No scleral icterus.  Cardiovascular: Normal rate and regular rhythm.   Pulmonary/Chest: Effort normal and breath sounds normal. No respiratory distress.  Abdominal: Normal appearance. There is no splenomegaly or hepatomegaly.  Musculoskeletal: Normal range of motion.  Neurological: She is alert and oriented to person, place, and time.  Skin: Skin is intact. No rash noted. No pallor.  Psychiatric: She has a normal mood and affect. Her behavior is normal. Judgment and thought content normal.    Results for orders placed or performed in visit on 07/09/17  CBC With Differential/Platelet  Result Value Ref Range   WBC 6.8 3.4 - 10.8 x10E3/uL   RBC 4.89 3.77 - 5.28 x10E6/uL   Hemoglobin 12.9 11.1 - 15.9 g/dL   Hematocrit 40.9 81.1 - 46.6 %   MCV 83 79 - 97 fL   MCH 26.4 (L) 26.6 - 33.0 pg   MCHC 31.9 31.5 - 35.7 g/dL   RDW 91.4 (H) 78.2 - 95.6 %   Platelets 271 150 - 379 x10E3/uL  Neutrophils 55 Not Estab. %   Lymphs 37 Not Estab. %   MID 8 Not Estab. %   Neutrophils Absolute 3.7 1.4 - 7.0 x10E3/uL   Lymphocytes Absolute 2.5 0.7 - 3.1 x10E3/uL   MID (Absolute) 0.6 0.1 - 1.6 X10E3/uL      Assessment & Plan:   Problem List Items Addressed This Visit      Unprioritized   Asthma    Feels like she is having a flare but never feels her breathing is good.  Get chest x-ray.  Prednisone      Relevant Medications   predniSONE (DELTASONE) 20 MG tablet    Other Visit Diagnoses    Viral upper respiratory tract infection    -  Primary   supportive care   Cough       Tessalon Perles, chest x-ray   Relevant Orders   DG Chest 2 View        Follow up plan: Return if symptoms worsen or fail to improve.

## 2017-10-30 ENCOUNTER — Ambulatory Visit: Payer: BLUE CROSS/BLUE SHIELD

## 2017-10-30 ENCOUNTER — Ambulatory Visit
Admission: RE | Admit: 2017-10-30 | Discharge: 2017-10-30 | Disposition: A | Discharge status: Home / Self Care | Payer: BLUE CROSS/BLUE SHIELD | Source: Ambulatory Visit | Attending: Unknown Physician Specialty | Admitting: Unknown Physician Specialty

## 2017-10-30 DIAGNOSIS — R059 Cough, unspecified: Secondary | ICD-10-CM

## 2017-10-30 DIAGNOSIS — R05 Cough: Secondary | ICD-10-CM

## 2017-11-28 ENCOUNTER — Ambulatory Visit: Payer: BLUE CROSS/BLUE SHIELD | Admitting: Family Medicine

## 2017-11-28 ENCOUNTER — Encounter: Payer: Self-pay | Admitting: Family Medicine

## 2017-11-28 VITALS — BP 131/82 | HR 54 | Temp 98.7°F | Wt 115.0 lb

## 2017-11-28 DIAGNOSIS — R0602 Shortness of breath: Secondary | ICD-10-CM | POA: Diagnosis not present

## 2017-11-28 DIAGNOSIS — R079 Chest pain, unspecified: Secondary | ICD-10-CM | POA: Diagnosis not present

## 2017-11-28 LAB — EKG 12-LEAD

## 2017-11-28 MED ORDER — ALBUTEROL SULFATE HFA 108 (90 BASE) MCG/ACT IN AERS: 2.0000 | INHALATION_SPRAY | Freq: Four times a day (QID) | RESPIRATORY_TRACT | 3 refills | Status: DC | PRN

## 2017-11-28 MED ORDER — BUDESONIDE-FORMOTEROL FUMARATE 160-4.5 MCG/ACT IN AERO
2.0000 | INHALATION_SPRAY | Freq: Two times a day (BID) | RESPIRATORY_TRACT | 3 refills | Status: DC
Start: 2017-11-28 — End: 2018-03-22

## 2017-11-28 MED ORDER — CYCLOBENZAPRINE HCL 5 MG PO TABS
5.0000 mg | ORAL_TABLET | Freq: Three times a day (TID) | ORAL | 0 refills | Status: DC | PRN
Start: 2017-11-28 — End: 2018-05-01

## 2017-11-28 NOTE — Progress Notes (Signed)
BP 131/82   Pulse (!) 54   Temp 98.7 F (37.1 C) (Oral)   Wt 115 lb (52.2 kg)   LMP  (LMP Unknown)   SpO2 99%   BMI 24.04 kg/m    Subjective:    Patient ID: Katherine [name], female    DOB: 1956-10-21, 62 y.o.   MRN: [id number]  HPI: Katherine [name] is a 62 y.o. female  Chief Complaint  Patient presents with  . Chest Pain  . Shortness of Breath   Dizziness, SOB, wheezing, chest tightness, and this morning right sided chest pain that's worse with deep breaths. Was diagnosed with asthma by spirometry several months ago given onset of her SOB sxs and was given an albuterol inhaler which she states works only mildly. Was mostly concerned with the chest soreness that just started. Denies N/V, diaphoresis, jaw pain, pain radiating down arms, hx of cardiac issues.   Relevant past medical, surgical, family and social history reviewed and updated as indicated. Interim medical history since our last visit reviewed. Allergies and medications reviewed and updated.  Review of Systems  Constitutional: Negative.   Respiratory: Positive for chest tightness and shortness of breath.   Cardiovascular: Positive for chest pain.  Gastrointestinal: Negative.   Genitourinary: Negative.   Musculoskeletal: Negative.   Neurological: Negative.   Psychiatric/Behavioral: Negative.    Per HPI unless specifically indicated above     Objective:    BP 131/82   Pulse (!) 54   Temp 98.7 F (37.1 C) (Oral)   Wt 115 lb (52.2 kg)   LMP  (LMP Unknown)   SpO2 99%   BMI 24.04 kg/m   Wt Readings from Last 3 Encounters:  11/28/17 115 lb (52.2 kg)  09/27/17 113 lb 9.6 oz (51.5 kg)  07/09/17 111 lb 9.6 oz (50.6 kg)    Physical Exam  Constitutional: She is oriented to person, place, and time. She appears well-developed and well-nourished. No distress.  HENT:  Head: Atraumatic.  Eyes: Conjunctivae are normal. Pupils are equal, round, and reactive to light. No scleral icterus.  Neck: Normal range of  motion. Neck supple.  Cardiovascular: Normal rate and normal heart sounds.  Pulmonary/Chest: Effort normal and breath sounds normal. No respiratory distress. She has no wheezes.  Musculoskeletal: Normal range of motion.  Neurological: She is alert and oriented to person, place, and time.  Skin: Skin is warm and dry.  Psychiatric: She has a normal mood and affect. Her behavior is normal.  Nursing note and vitals reviewed.  Results for orders placed or performed in visit on 11/28/17  EKG 12-Lead  Result Value Ref Range   FINAL DIAGNOSIS:        Assessment & Plan:   Problem List Items Addressed This Visit    None    Visit Diagnoses    Shortness of breath    -  Primary   Likely asthma-related with some soreness from muscle fatigue from mildly labored breathing Will add symbicort daily and monitor closely for relief   Relevant Orders   EKG 12-Lead (Completed)   Chest pain, unspecified type       EKG NSR with no ST or t wave abnormalities. Discussed w/ pt to go to ER if sxs persist or worsen, return precautions reviewed.    Relevant Orders   EKG 12-Lead (Completed)    Pt aware we cannot r/o MI without cardiac enzymes, does not want to go to ER will monitor at home. Return precautions given.  Follow up plan: Return if symptoms worsen or fail to improve.

## 2017-12-01 NOTE — Patient Instructions (Signed)
Follow up as needed

## 2017-12-05 ENCOUNTER — Emergency Department
Admission: EM | Admit: 2017-12-05 | Discharge: 2017-12-05 | Disposition: A | Discharge status: Home / Self Care | Payer: BLUE CROSS/BLUE SHIELD | Attending: Emergency Medicine | Admitting: Emergency Medicine

## 2017-12-05 ENCOUNTER — Encounter: Payer: Self-pay | Admitting: Emergency Medicine

## 2017-12-05 ENCOUNTER — Other Ambulatory Visit: Payer: Self-pay

## 2017-12-05 DIAGNOSIS — Z79899 Other long term (current) drug therapy: Secondary | ICD-10-CM | POA: Diagnosis not present

## 2017-12-05 DIAGNOSIS — R531 Weakness: Secondary | ICD-10-CM | POA: Diagnosis not present

## 2017-12-05 DIAGNOSIS — R197 Diarrhea, unspecified: Secondary | ICD-10-CM | POA: Insufficient documentation

## 2017-12-05 DIAGNOSIS — K625 Hemorrhage of anus and rectum: Secondary | ICD-10-CM | POA: Insufficient documentation

## 2017-12-05 LAB — CBC
HEMATOCRIT: 43.2 % (ref 35.0–47.0)
HEMOGLOBIN: 14.1 g/dL (ref 12.0–16.0)
MCH: 25.9 pg — ABNORMAL LOW (ref 26.0–34.0)
MCHC: 32.7 g/dL (ref 32.0–36.0)
MCV: 79.2 fL — AB (ref 80.0–100.0)
Platelets: 224 10*3/uL (ref 150–440)
RBC: 5.45 MIL/uL — AB (ref 3.80–5.20)
RDW: 14.7 % — ABNORMAL HIGH (ref 11.5–14.5)
WBC: 6.2 10*3/uL (ref 3.6–11.0)

## 2017-12-05 LAB — IRON AND TIBC
IRON: 32 ug/dL (ref 28–170)
Saturation Ratios: 12 % (ref 10.4–31.8)
TIBC: 276 ug/dL (ref 250–450)
UIBC: 244 ug/dL

## 2017-12-05 LAB — URINALYSIS, COMPLETE (UACMP) WITH MICROSCOPIC
BACTERIA UA: NONE SEEN
Bilirubin Urine: NEGATIVE
Glucose, UA: NEGATIVE mg/dL
HGB URINE DIPSTICK: NEGATIVE
Ketones, ur: NEGATIVE mg/dL
Leukocytes, UA: NEGATIVE
Nitrite: NEGATIVE
PROTEIN: NEGATIVE mg/dL
SPECIFIC GRAVITY, URINE: 1.01 (ref 1.005–1.030)
pH: 7 (ref 5.0–8.0)

## 2017-12-05 LAB — COMPREHENSIVE METABOLIC PANEL
ALT: 23 U/L (ref 14–54)
ANION GAP: 9 (ref 5–15)
AST: 31 U/L (ref 15–41)
Albumin: 4 g/dL (ref 3.5–5.0)
Alkaline Phosphatase: 80 U/L (ref 38–126)
BILIRUBIN TOTAL: 0.6 mg/dL (ref 0.3–1.2)
BUN: 10 mg/dL (ref 6–20)
CHLORIDE: 103 mmol/L (ref 101–111)
CO2: 27 mmol/L (ref 22–32)
Calcium: 9.2 mg/dL (ref 8.9–10.3)
Creatinine, Ser: 0.75 mg/dL (ref 0.44–1.00)
GFR calc Af Amer: >60 mL/min (ref >60)
Glucose, Bld: 109 mg/dL — ABNORMAL HIGH (ref 65–99)
POTASSIUM: 4.7 mmol/L (ref 3.5–5.1)
Sodium: 139 mmol/L (ref 135–145)
TOTAL PROTEIN: 7.8 g/dL (ref 6.5–8.1)

## 2017-12-05 LAB — TYPE AND SCREEN
ABO/RH(D): O POS
ANTIBODY SCREEN: NEGATIVE

## 2017-12-05 LAB — TROPONIN I

## 2017-12-05 LAB — FERRITIN: Ferritin: 251 ng/mL (ref 11–307)

## 2017-12-05 NOTE — Discharge Instructions (Signed)
please return for more heavy bleeding, fever or vomiting or feeling sicker. Please call the gastroenterologist listed and arrange a follow-up appointment. I have discussed her case with her and she should be able to do that very quickly.

## 2017-12-05 NOTE — ED Provider Notes (Signed)
[facility] Emergency Department Provider Note   ____________________________________________   First MD Initiated Contact with Patient 12/05/17 1159     (approximate)  I have reviewed the triage vital signs and the nursing notes.   HISTORY  Chief Complaint Rectal Bleeding; Diarrhea; and Weakness   HPI Katherine [name] is a 62 y.o. female Who had a history of some rectal bleeding in August of last year. She came in today with more rectal bleeding and diarrhea since she's had some lightheadedness and some chest pain. Pain is in the left chest reproduced by palpation. She is not short of breath with it.chest pain and rectal bleeding been intermittent for about 3 days. It was worse amount of blood loss today. Patient is not orthostatic her hematocrit is actually higher than it was last visit. She does not have any gross blood in her rectum and no palpable hemorrhoids or visualizable hemorrhoids or some small hemorrhoidal tags skin tags however the stool is Hemoccult positive   Past Medical History:  Diagnosis Date  . Carpal tunnel syndrome   . IBS (irritable bowel syndrome)   . Menopause     Patient Active Problem List   Diagnosis Date Noted  . Asthma 07/09/2017  . Acute shoulder bursitis, right 05/23/2017  . Low back pain 05/23/2017  . Neuropathy 04/16/2017  . Depression 08/30/2015  . Menopause 07/21/2015  . Carpal tunnel syndrome 07/21/2015  . IBS (irritable bowel syndrome) 07/21/2015  . Hypothyroid 07/21/2015    Past Surgical History:  Procedure Laterality Date  . APPENDECTOMY    . UPPER GI ENDOSCOPY      Prior to Admission medications   Medication Sig Start Date End Date Taking? Authorizing Provider  albuterol (PROVENTIL HFA;VENTOLIN HFA) 108 (90 Base) MCG/ACT inhaler Inhale 2 puffs into the lungs every 6 (six) hours as needed for wheezing or shortness of breath. 11/28/17   Particia Nearing, PA-C  benzonatate (TESSALON) 200 MG capsule  Take 1 capsule (200 mg total) by mouth 2 (two) times daily as needed for cough. Patient not taking: Reported on 11/28/2017 09/27/17   Gabriel Cirri, NP  budesonide-formoterol [facility]) 160-4.5 MCG/ACT inhaler Inhale 2 puffs into the lungs 2 (two) times daily. 11/28/17   Particia Nearing, PA-C  clobetasol cream (TEMOVATE) 0.05 % Apply 1 application topically 2 (two) times daily. Patient not taking: Reported on 05/31/2017 02/21/17   Particia Nearing, PA-C  cyclobenzaprine (FLEXERIL) 5 MG tablet Take 1 tablet (5 mg total) by mouth 3 (three) times daily as needed for muscle spasms. 11/28/17   Particia Nearing, PA-C  esomeprazole (NEXIUM) 20 MG capsule Take 1 capsule (20 mg total) by mouth daily. Patient not taking: Reported on 09/27/2017 05/28/17   Sharyn Creamer, MD  levothyroxine (SYNTHROID, LEVOTHROID) 50 MCG tablet Take 1 tablet (50 mcg total) by mouth daily. 04/16/17   Steele Sizer, MD  meloxicam (MOBIC) 15 MG tablet Take 15 mg by mouth daily. 05/13/17   [provider]  Multiple Vitamin (MULTIVITAMIN) tablet Take 1 tablet by mouth daily.    [provider]  predniSONE (DELTASONE) 20 MG tablet Take 2 tablets (40 mg total) by mouth daily with breakfast. Stop Meloxicam Patient not taking: Reported on 11/28/2017 09/27/17   Gabriel Cirri, NP  traMADol (ULTRAM) 50 MG tablet Take 1 tablet (50 mg total) by mouth every 8 (eight) hours as needed. Patient not taking: Reported on 09/27/2017 05/31/17   Gabriel Cirri, NP    Allergies Jonne Ply [aspirin]  History  reviewed. No pertinent family history.  Social History Social History   Tobacco Use  . Smoking status: Never Smoker  . Smokeless tobacco: Never Used  Substance Use Topics  . Alcohol use: No    Alcohol/week: 0.0 oz  . Drug use: No    Review of Systems  Constitutional: No fever/chills Eyes: No visual changes. ENT: No sore throat. Cardiovascular:  chest pain. Respiratory: Denies shortness of breath. Gastrointestinal:  No abdominal pain.  No nausea, no vomiting.  No diarrhea.  No constipation. Genitourinary: Negative for dysuria. Musculoskeletal: Negative for back pain. Skin: Negative for rash. Neurological: Negative for headaches, focal weakness   ____________________________________________   PHYSICAL EXAM:  VITAL SIGNS: ED Triage Vitals  Enc Vitals Group     BP 12/05/17 1101 125/78     Pulse Rate 12/05/17 1101 80     Resp 12/05/17 1101 20     Temp 12/05/17 1101 98 F (36.7 C)     Temp Source 12/05/17 1101 Oral     SpO2 12/05/17 1101 99 %     Weight 12/05/17 1101 115 lb (52.2 kg)     Height 12/05/17 1101 4\' 10"  (1.473 m)     Head Circumference --      Peak Flow --      Pain Score 12/05/17 1105 7     Pain Loc --      Pain Edu? --      Excl. in GC? --     Constitutional: Alert and oriented. Well appearing and in no acute distress. Eyes: Conjunctivae are normal.  Head: Atraumatic. Nose: No congestion/rhinnorhea. Mouth/Throat: Mucous membranes are moist.  Oropharynx non-erythematous. Neck: No stridor.  Cardiovascular: Normal rate, regular rhythm. Grossly normal heart sounds.  Good peripheral circulation. Respiratory: Normal respiratory effort.  No retractions. Lungs CTAB. Gastrointestinal: Soft and nontender. No distention. No abdominal bruits. No CVA tenderness. Musculoskeletal: No lower extremity tenderness nor edema.  No joint effusions. Neurologic:  Normal speech and language. No gross focal neurologic deficits are appreciated. No gait instability. Skin:  Skin is warm, dry and intact. No rash noted. Psychiatric: Mood and affect are normal. Speech and behavior are normal. rectal see description history of present illness Hemoccult-positive ____________________________________________   LABS (all labs ordered are listed, but only abnormal results are displayed)  Labs Reviewed  COMPREHENSIVE METABOLIC PANEL - Abnormal; Notable for the following components:      Result Value    Glucose, Bld 109 (*)    All other components within normal limits  CBC - Abnormal; Notable for the following components:   RBC 5.45 (*)    MCV 79.2 (*)    MCH 25.9 (*)    RDW 14.7 (*)    All other components within normal limits  URINALYSIS, COMPLETE (UACMP) WITH MICROSCOPIC - Abnormal; Notable for the following components:   Squamous Epithelial / LPF 0-5 (*)    All other components within normal limits  TROPONIN I  IRON AND TIBC  FERRITIN  POC OCCULT BLOOD, ED  TYPE AND SCREEN   ____________________________________________  EKG   ____________________________________________  RADIOLOGY   ____________________________________________   PROCEDURES  Procedure(s) performed:  Procedures  Critical Care performed:  ____________________________________________   INITIAL IMPRESSION / ASSESSMENT AND PLAN / ED COURSE  discussed with gastroenterology doctor [facility]. She will follow-up in the office. Patient is not orthostatic has a good blood count which is actually higher than previously. Patient has no further diarrhea here in the ER and no bloody stools either.  ____________________________________________   FINAL CLINICAL IMPRESSION(S) / ED DIAGNOSES  Final diagnoses:  Rectal bleeding     ED Discharge Orders    None       Note:  This document was prepared using Dragon voice recognition software and may include unintentional dictation errors.    Arnaldo NatalMalinda, Paul F, MD 12/05/17 1556

## 2017-12-05 NOTE — ED Triage Notes (Signed)
Pt reports diarrhea, rectal bleeding, dizziness and chest pain for three days.

## 2017-12-06 ENCOUNTER — Telehealth: Payer: Self-pay

## 2017-12-06 NOTE — Telephone Encounter (Signed)
-----   Message from Toney Reilohini Reddy Vanga, MD sent at 12/05/2017  2:07 PM EST ----- Regarding: out pt colonoscopy Please schedule out pt colonoscopy with me next week. Pt is in ER now, will likely be discharged today  -RV

## 2017-12-09 NOTE — Telephone Encounter (Signed)
lmom for patient to call back and schedule//kdc

## 2017-12-19 ENCOUNTER — Other Ambulatory Visit: Payer: Self-pay

## 2017-12-19 DIAGNOSIS — K625 Hemorrhage of anus and rectum: Secondary | ICD-10-CM

## 2017-12-31 ENCOUNTER — Other Ambulatory Visit: Payer: Self-pay

## 2017-12-31 NOTE — Discharge Instructions (Signed)
General Anesthesia, Adult, Care After °These instructions provide you with information about caring for yourself after your procedure. Your health care provider may also give you more specific instructions. Your treatment has been planned according to current medical practices, but problems sometimes occur. Call your health care provider if you have any problems or questions after your procedure. °What can I expect after the procedure? °After the procedure, it is common to have: °· Vomiting. °· A sore throat. °· Mental slowness. ° °It is common to feel: °· Nauseous. °· Cold or shivery. °· Sleepy. °· Tired. °· Sore or achy, even in parts of your body where you did not have surgery. ° °Follow these instructions at home: °For at least 24 hours after the procedure: °· Do not: °? Participate in activities where you could fall or become injured. °? Drive. °? Use heavy machinery. °? Drink alcohol. °? Take sleeping pills or medicines that cause drowsiness. °? Make important decisions or sign legal documents. °? Take care of children on your own. °· Rest. °Eating and drinking °· If you vomit, drink water, juice, or soup when you can drink without vomiting. °· Drink enough fluid to keep your urine clear or pale yellow. °· Make sure you have little or no nausea before eating solid foods. °· Follow the diet recommended by your health care provider. °General instructions °· Have a responsible adult stay with you until you are awake and alert. °· Return to your normal activities as told by your health care provider. Ask your health care provider what activities are safe for you. °· Take over-the-counter and prescription medicines only as told by your health care provider. °· If you smoke, do not smoke without supervision. °· Keep all follow-up visits as told by your health care provider. This is important. °Contact a health care provider if: °· You continue to have nausea or vomiting at home, and medicines are not helpful. °· You  cannot drink fluids or start eating again. °· You cannot urinate after 8-12 hours. °· You develop a skin rash. °· You have fever. °· You have increasing redness at the site of your procedure. °Get help right away if: °· You have difficulty breathing. °· You have chest pain. °· You have unexpected bleeding. °· You feel that you are having a life-threatening or urgent problem. °This information is not intended to replace advice given to you by your health care provider. Make sure you discuss any questions you have with your health care provider. °Document Released: 02/18/2001 Document Revised: 04/16/2016 Document Reviewed: 10/27/2015 °Elsevier Interactive Patient Education © 2018 [employer] ° °

## 2018-01-01 ENCOUNTER — Ambulatory Visit: Payer: BLUE CROSS/BLUE SHIELD | Admitting: Anesthesiology

## 2018-01-01 ENCOUNTER — Encounter
Admission: RE | Disposition: A | Discharge status: Home / Self Care | Payer: Self-pay | Source: Ambulatory Visit | Attending: Gastroenterology

## 2018-01-01 ENCOUNTER — Ambulatory Visit
Admission: RE | Admit: 2018-01-01 | Discharge: 2018-01-01 | Disposition: A | Discharge status: Home / Self Care | Payer: BLUE CROSS/BLUE SHIELD | Source: Ambulatory Visit | Attending: Gastroenterology | Admitting: Gastroenterology

## 2018-01-01 DIAGNOSIS — Z79899 Other long term (current) drug therapy: Secondary | ICD-10-CM | POA: Insufficient documentation

## 2018-01-01 DIAGNOSIS — Z7951 Long term (current) use of inhaled steroids: Secondary | ICD-10-CM | POA: Diagnosis not present

## 2018-01-01 DIAGNOSIS — K648 Other hemorrhoids: Secondary | ICD-10-CM | POA: Insufficient documentation

## 2018-01-01 DIAGNOSIS — K5730 Diverticulosis of large intestine without perforation or abscess without bleeding: Secondary | ICD-10-CM | POA: Insufficient documentation

## 2018-01-01 DIAGNOSIS — K625 Hemorrhage of anus and rectum: Secondary | ICD-10-CM

## 2018-01-01 DIAGNOSIS — E039 Hypothyroidism, unspecified: Secondary | ICD-10-CM | POA: Diagnosis not present

## 2018-01-01 DIAGNOSIS — Z791 Long term (current) use of non-steroidal anti-inflammatories (NSAID): Secondary | ICD-10-CM | POA: Insufficient documentation

## 2018-01-01 DIAGNOSIS — K219 Gastro-esophageal reflux disease without esophagitis: Secondary | ICD-10-CM | POA: Diagnosis not present

## 2018-01-01 DIAGNOSIS — J45909 Unspecified asthma, uncomplicated: Secondary | ICD-10-CM | POA: Diagnosis not present

## 2018-01-01 HISTORY — DX: Gastro-esophageal reflux disease without esophagitis: K21.9

## 2018-01-01 HISTORY — PX: COLONOSCOPY WITH PROPOFOL: SHX5780

## 2018-01-01 HISTORY — DX: Unspecified osteoarthritis, unspecified site: M19.90

## 2018-01-01 HISTORY — DX: Dyspnea, unspecified: R06.00

## 2018-01-01 HISTORY — DX: Motion sickness, initial encounter: T75.3XXA

## 2018-01-01 HISTORY — DX: Hypothyroidism, unspecified: E03.9

## 2018-01-01 HISTORY — DX: Anemia, unspecified: D64.9

## 2018-01-01 HISTORY — DX: Presence of dental prosthetic device (complete) (partial): Z97.2

## 2018-01-01 SURGERY — COLONOSCOPY WITH PROPOFOL
Anesthesia: General | Wound class: Contaminated

## 2018-01-01 MED ORDER — PROPOFOL 10 MG/ML IV BOLUS
INTRAVENOUS | Status: DC | PRN
  Administered 2018-01-01 (×2): 20 mg via INTRAVENOUS
  Administered 2018-01-01: 80 mg via INTRAVENOUS
  Administered 2018-01-01: 30 mg via INTRAVENOUS

## 2018-01-01 MED ORDER — LIDOCAINE HCL (CARDIAC) 20 MG/ML IV SOLN
INTRAVENOUS | Status: DC | PRN
  Administered 2018-01-01: 50 mg via INTRAVENOUS

## 2018-01-01 MED ORDER — STERILE WATER FOR IRRIGATION IR SOLN
Status: DC | PRN
  Administered 2018-01-01: 11:00:00

## 2018-01-01 MED ORDER — SODIUM CHLORIDE 0.9 % IV SOLN: INTRAVENOUS | Status: DC

## 2018-01-01 MED ORDER — LACTATED RINGERS IV SOLN
INTRAVENOUS | Status: DC
  Administered 2018-01-01: 09:00:00 via INTRAVENOUS

## 2018-01-01 SURGICAL SUPPLY — 25 items
CANISTER SUCT 1200ML W/VALVE (MISCELLANEOUS) ×2 IMPLANT
CLIP HMST 235XBRD CATH ROT (MISCELLANEOUS) IMPLANT
CLIP RESOLUTION 360 11X235 (MISCELLANEOUS)
ELECT REM PT RETURN 9FT ADLT (ELECTROSURGICAL)
ELECTRODE REM PT RTRN 9FT ADLT (ELECTROSURGICAL) IMPLANT
FCP ESCP3.2XJMB 240X2.8X (MISCELLANEOUS)
FORCEPS BIOP RAD 4 LRG CAP 4 (CUTTING FORCEPS) IMPLANT
FORCEPS BIOP RJ4 240 W/NDL (MISCELLANEOUS)
FORCEPS ESCP3.2XJMB 240X2.8X (MISCELLANEOUS) IMPLANT
GOWN CVR UNV OPN BCK APRN NK (MISCELLANEOUS) ×2 IMPLANT
GOWN ISOL THUMB LOOP REG UNIV (MISCELLANEOUS) ×2
INJECTOR VARIJECT VIN23 (MISCELLANEOUS) IMPLANT
KIT DEFENDO VALVE AND CONN (KITS) IMPLANT
KIT ENDO PROCEDURE OLY (KITS) ×2 IMPLANT
MARKER SPOT ENDO TATTOO 5ML (MISCELLANEOUS) IMPLANT
PROBE APC STR FIRE (PROBE) IMPLANT
RETRIEVER NET ROTH 2.5X230 LF (MISCELLANEOUS) IMPLANT
SNARE COLD EXACTO (MISCELLANEOUS) IMPLANT
SNARE SHORT THROW 13M SML OVAL (MISCELLANEOUS) IMPLANT
SNARE SHORT THROW 30M LRG OVAL (MISCELLANEOUS) IMPLANT
SNARE SNG USE RND 15MM (INSTRUMENTS) IMPLANT
SPOT EX ENDOSCOPIC TATTOO (MISCELLANEOUS)
TRAP ETRAP POLY (MISCELLANEOUS) IMPLANT
VARIJECT INJECTOR VIN23 (MISCELLANEOUS)
WATER STERILE IRR 250ML POUR (IV SOLUTION) ×2 IMPLANT

## 2018-01-01 NOTE — Anesthesia Postprocedure Evaluation (Signed)
Anesthesia Post Note  Patient: Katherine [name]  Procedure(s) Performed: COLONOSCOPY WITH PROPOFOL (N/A )  Patient location during evaluation: PACU Anesthesia Type: General Level of consciousness: awake and alert Pain management: pain level controlled Vital Signs Assessment: post-procedure vital signs reviewed and stable Respiratory status: spontaneous breathing, nonlabored ventilation, respiratory function stable and patient connected to nasal cannula oxygen Cardiovascular status: blood pressure returned to baseline and stable Postop Assessment: no apparent nausea or vomiting Anesthetic complications: no    Allyanna Appleman ELAINE

## 2018-01-01 NOTE — Anesthesia Preprocedure Evaluation (Signed)
Anesthesia Evaluation  Patient identified by MRN, date of birth, ID band Patient awake    Reviewed: Allergy & Precautions, H&P , NPO status , Patient's Chart, lab work & pertinent test results, reviewed documented beta blocker date and time   Airway Mallampati: II  TM Distance: >3 FB Neck ROM: full    Dental  (+) Partial Upper   Pulmonary shortness of breath, asthma ,    Pulmonary exam normal breath sounds clear to auscultation       Cardiovascular Exercise Tolerance: Good negative cardio ROS   Rhythm:regular Rate:Normal     Neuro/Psych PSYCHIATRIC DISORDERS negative neurological ROS     GI/Hepatic Neg liver ROS, GERD  ,  Endo/Other  Hypothyroidism   Renal/GU negative Renal ROS  negative genitourinary   Musculoskeletal   Abdominal   Peds  Hematology  (+) anemia ,   Anesthesia Other Findings   Reproductive/Obstetrics negative OB ROS                             Anesthesia Physical Anesthesia Plan  ASA: II  Anesthesia Plan: General   Post-op Pain Management:    Induction:   PONV Risk Score and Plan:   Airway Management Planned:   Additional Equipment:   Intra-op Plan:   Post-operative Plan:   Informed Consent: I have reviewed the patients History and Physical, chart, labs and discussed the procedure including the risks, benefits and alternatives for the proposed anesthesia with the patient or authorized representative who has indicated his/her understanding and acceptance.   Dental Advisory Given  Plan Discussed with: CRNA  Anesthesia Plan Comments:         Anesthesia Quick Evaluation

## 2018-01-01 NOTE — Op Note (Signed)
[facility]enter[facility] Patient Name: Katherine [name] Procedure Date: 01/01/2018 10:50 AM MRN: [id number] Account #: [id number] Date of Birth: 05/25/1956 Admit Type: Outpatient Age: 62 Room: [facility] OR ROOM 01 Gender: Female Note Status: Finalized Procedure:            Colonoscopy Indications:          Rectal bleeding Providers:            Lin Landsman MD, MD Referring MD:         Guadalupe Maple, MD (Referring MD) Medicines:            Monitored Anesthesia Care Complications:        No immediate complications. Estimated blood loss: None. Procedure:            Pre-Anesthesia Assessment:                       - Prior to the procedure, a History and Physical was                        performed, and patient medications and allergies were                        reviewed. The patient is competent. The risks and                        benefits of the procedure and the sedation options and                        risks were discussed with the patient. All questions                        were answered and informed consent was obtained.                        Patient identification and proposed procedure were                        verified by the physician, the nurse, the                        anesthesiologist, the anesthetist and the technician in                        the pre-procedure area in the procedure room. Mental                        Status Examination: alert and oriented. Airway                        Examination: normal oropharyngeal airway and neck                        mobility. Respiratory Examination: clear to                        auscultation. CV Examination: normal. Prophylactic                        Antibiotics: The patient does not require prophylactic  antibiotics. Prior Anticoagulants: The patient has                        taken no previous anticoagulant or antiplatelet agents.                        ASA Grade  Assessment: II - A patient with mild systemic                        disease. After reviewing the risks and benefits, the                        patient was deemed in satisfactory condition to undergo                        the procedure. The anesthesia plan was to use monitored                        anesthesia care (MAC). Immediately prior to                        administration of medications, the patient was                        re-assessed for adequacy to receive sedatives. The                        heart rate, respiratory rate, oxygen saturations, blood                        pressure, adequacy of pulmonary ventilation, and                        response to care were monitored throughout the                        procedure. The physical status of the patient was                        re-assessed after the procedure.                       After obtaining informed consent, the colonoscope was                        passed under direct vision. Throughout the procedure,                        the patient's blood pressure, pulse, and oxygen                        saturations were monitored continuously. The Olympus                        Colonoscope 190 [id number]) was introduced through the                        anus and advanced to the the terminal ileum. The  colonoscopy was performed without difficulty. The                        patient tolerated the procedure well. The quality of                        the bowel preparation was evaluated using the BBPS                        [facility] Bowel Preparation Scale) with scores of: Right                        Colon = 3, Transverse Colon = 3 and Left Colon = 3                        (entire mucosa seen well with no residual staining,                        small fragments of stool or opaque liquid). The total                        BBPS score equals 9. Findings:      The perianal and digital rectal examinations  were normal. Pertinent       negatives include normal sphincter tone and no palpable rectal lesions.      The terminal ileum appeared normal.      Many small and large-mouthed diverticula were found in the sigmoid colon.      Non-bleeding internal hemorrhoids were found during retroflexion. The       hemorrhoids were large.      The exam was otherwise without abnormality. Impression:           - The examined portion of the ileum was normal.                       - Diverticulosis in the sigmoid colon.                       - Non-bleeding internal hemorrhoids.                       - The examination was otherwise normal.                       - No specimens collected. Recommendation:       - Discharge patient to home.                       - Resume regular diet today.                       - Continue present medications.                       - Repeat colonoscopy in 10 years for surveillance. Procedure Code(s):    --- Professional ---                       [id number], Colonoscopy, flexible; diagnostic, including                        collection of specimen(s) by  brushing or washing, when                        performed (separate procedure) Diagnosis Code(s):    --- Professional ---                       K64.8, Other hemorrhoids                       K62.5, Hemorrhage of anus and rectum                       K57.30, Diverticulosis of large intestine without                        perforation or abscess without bleeding CPT copyright 2016 [employer]. All rights reserved. The codes documented in this report are preliminary and upon coder review may  be revised to meet current compliance requirements. Dr. Ulyess Mort Lin Landsman MD, MD 01/01/2018 11:08:48 AM This report has been signed electronically. Number of Addenda: 0 Note Initiated On: 01/01/2018 10:50 AM Scope Withdrawal Time: 0 hours 5 minutes 3 seconds  Total Procedure Duration: 0 hours 8 minutes 8 seconds        [facility]

## 2018-01-01 NOTE — Anesthesia Procedure Notes (Signed)
Date/Time: 01/01/2018 10:52 AM Performed by: Maree KrabbeWarren, Demere Dotzler, CRNA Pre-anesthesia Checklist: Patient identified, Emergency Drugs available, Suction available, Timeout performed and Patient being monitored Patient Re-evaluated:Patient Re-evaluated prior to induction Oxygen Delivery Method: Nasal cannula Placement Confirmation: positive ETCO2

## 2018-01-01 NOTE — Transfer of Care (Signed)
Immediate Anesthesia Transfer of Care Note  Patient: Katherine [name]  Procedure(s) Performed: COLONOSCOPY WITH PROPOFOL (N/A )  Patient Location: PACU  Anesthesia Type: General  Level of Consciousness: awake, alert  and patient cooperative  Airway and Oxygen Therapy: Patient Spontanous Breathing and Patient connected to supplemental oxygen  Post-op Assessment: Post-op Vital signs reviewed, Patient's Cardiovascular Status Stable, Respiratory Function Stable, Patent Airway and No signs of Nausea or vomiting  Post-op Vital Signs: Reviewed and stable  Complications: No apparent anesthesia complications

## 2018-01-01 NOTE — H&P (Signed)
Arlyss Repress, MD [address]  [address]  [place], [place] [address]  Main: [phone]  Fax: [phone] Pager: [phone]  Primary Care Physician:  Steele Sizer, MD Primary Gastroenterologist:  Dr. Arlyss Repress  Pre-Procedure History & Physical: HPI:  Zamiyah Resendes is a 62 y.o. female is here for an colonoscopy.   Past Medical History:  Diagnosis Date  . Anemia   . Arthritis    joints  . Carpal tunnel syndrome    right arm  . Dyspnea   . GERD (gastroesophageal reflux disease)   . Hypothyroidism   . IBS (irritable bowel syndrome)   . Menopause   . Motion sickness   . Wears partial dentures    upper    Past Surgical History:  Procedure Laterality Date  . APPENDECTOMY    . UPPER GI ENDOSCOPY      Prior to Admission medications   Medication Sig Start Date End Date Taking? Authorizing Provider  budesonide-formoterol (SYMBICORT) 160-4.5 MCG/ACT inhaler Inhale 2 puffs into the lungs 2 (two) times daily. 11/28/17  Yes Particia Nearing, PA-C  levothyroxine (SYNTHROID, LEVOTHROID) 50 MCG tablet Take 1 tablet (50 mcg total) by mouth daily. Patient taking differently: Take 50 mcg by mouth daily before breakfast.  04/16/17  Yes Crissman, Redge Gainer, MD  albuterol (PROVENTIL HFA;VENTOLIN HFA) 108 (90 Base) MCG/ACT inhaler Inhale 2 puffs into the lungs every 6 (six) hours as needed for wheezing or shortness of breath. 11/28/17   Particia Nearing, PA-C  benzonatate (TESSALON) 200 MG capsule Take 1 capsule (200 mg total) by mouth 2 (two) times daily as needed for cough. Patient not taking: Reported on 11/28/2017 09/27/17   Gabriel Cirri, NP  clobetasol cream (TEMOVATE) 0.05 % Apply 1 application topically 2 (two) times daily. Patient not taking: Reported on 05/31/2017 02/21/17   Particia Nearing, PA-C  cyclobenzaprine (FLEXERIL) 5 MG tablet Take 1 tablet (5 mg total) by mouth 3 (three) times daily as needed for muscle spasms. Patient not taking: Reported on  12/31/2017 11/28/17   Particia Nearing, PA-C  esomeprazole (NEXIUM) 20 MG capsule Take 1 capsule (20 mg total) by mouth daily. Patient not taking: Reported on 09/27/2017 05/28/17   Sharyn Creamer, MD  meloxicam (MOBIC) 15 MG tablet Take 15 mg by mouth daily. 05/13/17   [provider]  Multiple Vitamin (MULTIVITAMIN) tablet Take 1 tablet by mouth daily.    [provider]  predniSONE (DELTASONE) 20 MG tablet Take 2 tablets (40 mg total) by mouth daily with breakfast. Stop Meloxicam Patient not taking: Reported on 11/28/2017 09/27/17   Gabriel Cirri, NP  traMADol (ULTRAM) 50 MG tablet Take 1 tablet (50 mg total) by mouth every 8 (eight) hours as needed. Patient not taking: Reported on 09/27/2017 05/31/17   Gabriel Cirri, NP    Allergies as of 12/19/2017 - Review Complete 12/05/2017  Allergen Reaction Noted  . Asa [aspirin]  07/21/2015    History reviewed. No pertinent family history.  Social History   Socioeconomic History  . Marital status: Married    Spouse name: Not on file  . Number of children: Not on file  . Years of education: Not on file  . Highest education level: Not on file  Social Needs  . Financial resource strain: Not on file  . Food insecurity - worry: Not on file  . Food insecurity - inability: Not on file  . Transportation needs - medical: Not on file  . Transportation needs -  non-medical: Not on file  Occupational History  . Not on file  Tobacco Use  . Smoking status: Never Smoker  . Smokeless tobacco: Never Used  Substance and Sexual Activity  . Alcohol use: No    Alcohol/week: 0.0 oz    Comment: rarely  . Drug use: No  . Sexual activity: No  Other Topics Concern  . Not on file  Social History Narrative  . Not on file    Review of Systems: See HPI, otherwise negative ROS  Physical Exam: BP (!) 131/97   Pulse 88   Temp 97.9 F (36.6 C) (Temporal)   Resp 16   Ht 4\' 10"  (1.473 m)   Wt 105 lb (47.6 kg)   LMP  (LMP Unknown)   SpO2 99%    BMI 21.95 kg/m  General:   Alert,  pleasant and cooperative in NAD Head:  Normocephalic and atraumatic. Neck:  Supple; no masses or thyromegaly. Lungs:  Clear throughout to auscultation.    Heart:  Regular rate and rhythm. Abdomen:  Soft, nontender and nondistended. Normal bowel sounds, without guarding, and without rebound.   Neurologic:  Alert and  oriented x4;  grossly normal neurologically.  Impression/Plan: Jeneen Rinksmphay Meda is here for an colonoscopy to be performed for rectal bleeding  Risks, benefits, limitations, and alternatives regarding  colonoscopy have been reviewed with the patient.  Questions have been answered.  All parties agreeable.   Lannette Donathohini Vanga, MD  01/01/2018, 10:03 AM

## 2018-03-22 ENCOUNTER — Other Ambulatory Visit: Payer: Self-pay | Admitting: Family Medicine

## 2018-04-22 ENCOUNTER — Encounter: Payer: BLUE CROSS/BLUE SHIELD | Admitting: Family Medicine

## 2018-05-01 ENCOUNTER — Ambulatory Visit (INDEPENDENT_AMBULATORY_CARE_PROVIDER_SITE_OTHER): Payer: BLUE CROSS/BLUE SHIELD | Admitting: Family Medicine

## 2018-05-01 ENCOUNTER — Encounter: Payer: Self-pay | Admitting: Family Medicine

## 2018-05-01 VITALS — BP 120/77 | HR 73 | Ht 61.0 in | Wt 107.0 lb

## 2018-05-01 DIAGNOSIS — Z0000 Encounter for general adult medical examination without abnormal findings: Secondary | ICD-10-CM

## 2018-05-01 DIAGNOSIS — E039 Hypothyroidism, unspecified: Secondary | ICD-10-CM | POA: Diagnosis not present

## 2018-05-01 DIAGNOSIS — M5441 Lumbago with sciatica, right side: Secondary | ICD-10-CM | POA: Diagnosis not present

## 2018-05-01 DIAGNOSIS — Z13220 Encounter for screening for lipoid disorders: Secondary | ICD-10-CM | POA: Diagnosis not present

## 2018-05-01 DIAGNOSIS — G8929 Other chronic pain: Secondary | ICD-10-CM

## 2018-05-01 DIAGNOSIS — K625 Hemorrhage of anus and rectum: Secondary | ICD-10-CM | POA: Diagnosis not present

## 2018-05-01 DIAGNOSIS — J4521 Mild intermittent asthma with (acute) exacerbation: Secondary | ICD-10-CM | POA: Diagnosis not present

## 2018-05-01 DIAGNOSIS — Z1329 Encounter for screening for other suspected endocrine disorder: Secondary | ICD-10-CM | POA: Diagnosis not present

## 2018-05-01 MED ORDER — MELOXICAM 15 MG PO TABS: 15.0000 mg | ORAL_TABLET | Freq: Every day | ORAL | 3 refills | Status: DC

## 2018-05-01 MED ORDER — LEVOTHYROXINE SODIUM 50 MCG PO TABS: 50.0000 ug | ORAL_TABLET | Freq: Every day | ORAL | 12 refills | Status: DC

## 2018-05-01 NOTE — Assessment & Plan Note (Signed)
-  Stable on no medications.
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
## 2018-05-01 NOTE — Assessment & Plan Note (Signed)
The current medical regimen is effective;  continue present plan and medications.  
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
## 2018-05-01 NOTE — Assessment & Plan Note (Signed)
Resolved after follow-up with GI no further issues

## 2018-05-02 LAB — COMPREHENSIVE METABOLIC PANEL
ALK PHOS: 105 IU/L (ref 39–117)
ALT: 18 IU/L (ref 0–32)
AST: 22 IU/L (ref 0–40)
Albumin/Globulin Ratio: 1.1 — ABNORMAL LOW (ref 1.2–2.2)
Albumin: 4.1 g/dL (ref 3.5–5.5)
BUN/Creatinine Ratio: 18 (ref 9–23)
BUN: 12 mg/dL (ref 6–24)
CO2: 25 mmol/L (ref 20–29)
Calcium: 9.3 mg/dL (ref 8.7–10.2)
Chloride: 105 mmol/L (ref 96–106)
Creatinine, Ser: 0.66 mg/dL (ref 0.57–1.00)
GFR calc Af Amer: 112 mL/min/{1.73_m2} (ref >59)
GFR calc non Af Amer: 97 mL/min/{1.73_m2} (ref >59)
Globulin, Total: 3.6 g/dL (ref 1.5–4.5)
Glucose: 101 mg/dL — ABNORMAL HIGH (ref 65–99)
Potassium: 4.8 mmol/L (ref 3.5–5.2)
Sodium: 144 mmol/L (ref 134–144)
Total Protein: 7.7 g/dL (ref 6.0–8.5)

## 2018-05-02 LAB — CBC WITH DIFFERENTIAL/PLATELET
BASOS ABS: 0 10*3/uL (ref 0.0–0.2)
Basos: 1 %
EOS (ABSOLUTE): 0.2 10*3/uL (ref 0.0–0.4)
Eos: 3 %
Hematocrit: 38.9 % (ref 34.0–46.6)
Hemoglobin: 12.5 g/dL (ref 11.1–15.9)
IMMATURE GRANS (ABS): 0 10*3/uL (ref 0.0–0.1)
Immature Granulocytes: 0 %
LYMPHS ABS: 3 10*3/uL (ref 0.7–3.1)
LYMPHS: 45 %
MCH: 25.5 pg — AB (ref 26.6–33.0)
MCHC: 32.1 g/dL (ref 31.5–35.7)
MCV: 79 fL (ref 79–97)
Monocytes Absolute: 0.3 10*3/uL (ref 0.1–0.9)
Monocytes: 5 %
NEUTROS ABS: 3 10*3/uL (ref 1.4–7.0)
Neutrophils: 46 %
PLATELETS: 246 10*3/uL (ref 150–450)
RBC: 4.91 x10E6/uL (ref 3.77–5.28)
RDW: 15.2 % (ref 12.3–15.4)
WBC: 6.5 10*3/uL (ref 3.4–10.8)

## 2018-05-02 LAB — LIPID PANEL
CHOLESTEROL TOTAL: 192 mg/dL (ref 100–199)
Chol/HDL Ratio: 4.6 ratio — ABNORMAL HIGH (ref 0.0–4.4)
HDL: 42 mg/dL (ref >39)
LDL Calculated: 72 mg/dL (ref 0–99)
TRIGLYCERIDES: 392 mg/dL — AB (ref 0–149)
VLDL CHOLESTEROL CAL: 78 mg/dL — AB (ref 5–40)

## 2018-05-02 LAB — URINALYSIS, ROUTINE W REFLEX MICROSCOPIC
Bilirubin, UA: NEGATIVE
Glucose, UA: NEGATIVE
Ketones, UA: NEGATIVE
LEUKOCYTES UA: NEGATIVE
NITRITE UA: NEGATIVE
PH UA: 7.5 (ref 5.0–7.5)
Protein, UA: NEGATIVE
RBC UA: NEGATIVE
SPEC GRAV UA: 1.01 (ref 1.005–1.030)
Urobilinogen, Ur: 0.2 mg/dL (ref 0.2–1.0)

## 2018-05-02 LAB — TSH: TSH: 7.89 u[IU]/mL — ABNORMAL HIGH (ref 0.450–4.500)

## 2018-05-13 ENCOUNTER — Telehealth: Payer: Self-pay | Admitting: Family Medicine

## 2018-05-13 DIAGNOSIS — E039 Hypothyroidism, unspecified: Secondary | ICD-10-CM

## 2018-05-13 DIAGNOSIS — R7989 Other specified abnormal findings of blood chemistry: Secondary | ICD-10-CM

## 2018-05-13 MED ORDER — LEVOTHYROXINE SODIUM 75 MCG PO TABS: 75.0000 ug | ORAL_TABLET | Freq: Every day | ORAL | 3 refills | Status: DC

## 2018-05-13 NOTE — Telephone Encounter (Signed)
-----   Message from Richarda OverlieJada A [place], [place] sent at 05/13/2018  5:07 PM EDT ----- Patient was transferred to provider for telephone conversation.

## 2018-07-23 LAB — HM MAMMOGRAPHY

## 2018-09-09 ENCOUNTER — Other Ambulatory Visit: Payer: Self-pay | Admitting: Family Medicine

## 2018-09-09 NOTE — Telephone Encounter (Signed)
Requested medication (s) are due for refill today: Yes  Requested medication (s) are on the active medication list: Yes  Last refill:  05/13/18  Future visit scheduled: Yes  Notes to clinic:  Unable to refill per protocol due to abnormal labs.     Requested Prescriptions  Pending Prescriptions Disp Refills   levothyroxine (SYNTHROID, LEVOTHROID) 75 MCG tablet [Pharmacy Med Name: LEVOTHYROXINE 75 MCG TABLET] 30 tablet 3    Sig: TAKE 1 TABLET BY MOUTH EVERY DAY     Endocrinology:  Hypothyroid Agents Failed - 09/09/2018  1:51 AM      Failed - TSH needs to be rechecked within 3 months after an abnormal result. Refill until TSH is due.      Failed - TSH in normal range and within 360 days    TSH  Date Value Ref Range Status  05/01/2018 7.890 (H) 0.450 - 4.500 uIU/mL Final         Passed - Valid encounter within last 12 months    Recent Outpatient Visits          4 months ago Hypothyroidism, unspecified type   [facility] Dossie Arbour, Redge Gainer, MD   9 months ago Shortness of breath   [facility] Roosvelt Maser [place], [place]   11 months ago Viral upper respiratory tract infection   [facility] Gabriel Cirri, NP   1 year ago SOB (shortness of breath)   [facility] Gabriel Cirri, NP   1 year ago Neck pain   [facility] Gabriel Cirri, NP      Future Appointments            In 8 months Crissman, Redge Gainer, MD [facility]

## 2018-09-10 NOTE — Telephone Encounter (Signed)
Patient needs a blood test before long-term prescription can be given Her TSH is ordered.  No fasting needed to show up during business hours for blood work.

## 2018-09-15 ENCOUNTER — Other Ambulatory Visit: Payer: BLUE CROSS/BLUE SHIELD

## 2018-09-15 DIAGNOSIS — R7989 Other specified abnormal findings of blood chemistry: Secondary | ICD-10-CM

## 2018-09-16 LAB — TSH: TSH: 0.173 u[IU]/mL — AB (ref 0.450–4.500)

## 2018-09-18 ENCOUNTER — Other Ambulatory Visit: Payer: Self-pay | Admitting: Family Medicine

## 2018-09-18 DIAGNOSIS — E039 Hypothyroidism, unspecified: Secondary | ICD-10-CM

## 2018-09-18 MED ORDER — LEVOTHYROXINE SODIUM 50 MCG PO TABS: 50.0000 ug | ORAL_TABLET | Freq: Every day | ORAL | 2 refills | Status: DC

## 2018-09-18 NOTE — Progress Notes (Signed)
Phone call Discussed with patient's husband who will relay message to his wife about thyroid dosing being too high will decrease thyroid to 50 mcg and recheck TSH 2 months.
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
## 2019-05-07 ENCOUNTER — Encounter: Payer: BLUE CROSS/BLUE SHIELD | Admitting: Family Medicine

## 2019-05-11 ENCOUNTER — Ambulatory Visit (INDEPENDENT_AMBULATORY_CARE_PROVIDER_SITE_OTHER): Payer: BLUE CROSS/BLUE SHIELD | Admitting: Nurse Practitioner

## 2019-05-11 ENCOUNTER — Other Ambulatory Visit: Payer: Self-pay

## 2019-05-11 ENCOUNTER — Other Ambulatory Visit (HOSPITAL_COMMUNITY): Admission: RE | Admit: 2019-05-11 | Payer: BLUE CROSS/BLUE SHIELD | Source: Ambulatory Visit

## 2019-05-11 ENCOUNTER — Encounter: Payer: Self-pay | Admitting: Nurse Practitioner

## 2019-05-11 VITALS — BP 121/78 | HR 76 | Temp 98.7°F | Ht <= 58 in | Wt 118.0 lb

## 2019-05-11 DIAGNOSIS — Z13220 Encounter for screening for lipoid disorders: Secondary | ICD-10-CM

## 2019-05-11 DIAGNOSIS — E039 Hypothyroidism, unspecified: Secondary | ICD-10-CM

## 2019-05-11 DIAGNOSIS — E559 Vitamin D deficiency, unspecified: Secondary | ICD-10-CM | POA: Diagnosis not present

## 2019-05-11 DIAGNOSIS — Z0000 Encounter for general adult medical examination without abnormal findings: Secondary | ICD-10-CM

## 2019-05-11 DIAGNOSIS — Z23 Encounter for immunization: Secondary | ICD-10-CM | POA: Diagnosis not present

## 2019-05-11 DIAGNOSIS — M79642 Pain in left hand: Secondary | ICD-10-CM | POA: Diagnosis not present

## 2019-05-11 MED ORDER — ALBUTEROL SULFATE HFA 108 (90 BASE) MCG/ACT IN AERS: 2.0000 | INHALATION_SPRAY | Freq: Four times a day (QID) | RESPIRATORY_TRACT | 3 refills | Status: DC | PRN

## 2019-05-11 MED ORDER — ZOSTER VAC RECOMB ADJUVANTED 50 MCG/0.5ML IM SUSR: 0.5000 mL | Freq: Once | INTRAMUSCULAR | 0 refills | Status: AC

## 2019-05-11 MED ORDER — DICLOFENAC SODIUM 1 % TD GEL: 2.0000 g | TRANSDERMAL | 5 refills | Status: DC | PRN

## 2019-05-11 NOTE — Assessment & Plan Note (Signed)
Chronic, ongoing.  Continue current medication regimen and adjust as needed.  Recheck TSH today.

## 2019-05-11 NOTE — Assessment & Plan Note (Signed)
Acute and improving, post fall.  Full ROM without pain.  Will obtain imaging.  Recommend ice to area as needed for swelling and Tylenol for pain as needed.  Return for worsening or continued symptoms.

## 2019-05-11 NOTE — Patient Instructions (Addendum)
[facility]: [address], [place], [place] [phone]  Hypothyroidism  Hypothyroidism is when the thyroid gland does not make enough of certain hormones (it is underactive). The thyroid gland is a small gland located in the lower front part of the neck, just in front of the windpipe (trachea). This gland makes hormones that help control how the body uses food for energy (metabolism) as well as how the heart and brain function. These hormones also play a role in keeping your bones strong. When the thyroid is underactive, it produces too little of the hormones thyroxine (T4) and triiodothyronine (T3). What are the causes? This condition may be caused by:  Hashimoto's disease. This is a disease in which the body's disease-fighting system (immune system) attacks the thyroid gland. This is the most common cause.  Viral infections.  Pregnancy.  Certain medicines.  Birth defects.  Past radiation treatments to the head or neck for cancer.  Past treatment with radioactive iodine.  Past exposure to radiation in the environment.  Past surgical removal of part or all of the thyroid.  Problems with a gland in the center of the brain (pituitary gland).  Lack of enough iodine in the diet. What increases the risk? You are more likely to develop this condition if:  You are female.  You have a family history of thyroid conditions.  You use a medicine called lithium.  You take medicines that affect the immune system (immunosuppressants). What are the signs or symptoms? Symptoms of this condition include:  Feeling as though you have no energy (lethargy).  Not being able to tolerate cold.  Weight gain that is not explained by a change in diet or exercise habits.  Lack of appetite.  Dry skin.  Coarse hair.  Menstrual irregularity.  Slowing of thought processes.  Constipation.  Sadness or depression. How is this diagnosed? This condition may be diagnosed based on:   Your symptoms, your medical history, and a physical exam.  Blood tests. You may also have imaging tests, such as an ultrasound or MRI. How is this treated? This condition is treated with medicine that replaces the thyroid hormones that your body does not make. After you begin treatment, it may take several weeks for symptoms to go away. Follow these instructions at home:  Take over-the-counter and prescription medicines only as told by your health care provider.  If you start taking any new medicines, tell your health care provider.  Keep all follow-up visits as told by your health care provider. This is important. ? As your condition improves, your dosage of thyroid hormone medicine may change. ? You will need to have blood tests regularly so that your health care provider can monitor your condition. Contact a health care provider if:  Your symptoms do not get better with treatment.  You are taking thyroid replacement medicine and you: ? Sweat a lot. ? Have tremors. ? Feel anxious. ? Lose weight rapidly. ? Cannot tolerate heat. ? Have emotional swings. ? Have diarrhea. ? Feel weak. Get help right away if you have:  Chest pain.  An irregular heartbeat.  A rapid heartbeat.  Difficulty breathing. Summary  Hypothyroidism is when the thyroid gland does not make enough of certain hormones (it is underactive).  When the thyroid is underactive, it produces too little of the hormones thyroxine (T4) and triiodothyronine (T3).  The most common cause is Hashimoto's disease, a disease in which the body's disease-fighting system (immune system) attacks the thyroid gland. The condition  can also be caused by viral infections, medicine, pregnancy, or past radiation treatment to the head or neck.  Symptoms may include weight gain, dry skin, constipation, feeling as though you do not have energy, and not being able to tolerate cold.  This condition is treated with medicine to replace the  thyroid hormones that your body does not make. This information is not intended to replace advice given to you by your health care provider. Make sure you discuss any questions you have with your health care provider. Document Released: 11/12/2005 Document Revised: 10/23/2017 Document Reviewed: 10/23/2017 Elsevier Interactive Patient Education  2019 [employer].

## 2019-05-11 NOTE — Progress Notes (Signed)
BP 121/78   Pulse 76   Temp 98.7 F (37.1 C) (Oral)   Ht 4' 9.48" (1.46 m)   Wt 118 lb (53.5 kg)   LMP  (LMP Unknown)   SpO2 100%   BMI 25.11 kg/m    Subjective:    Patient ID: Katherine [name], female    DOB: [birth year], 63 y.o.   MRN: [id number]  HPI:  Katherine Rinksmphay Ackley is a 63 y.o. female presenting on 05/11/2019 for comprehensive medical examination. Current medical complaints include: yesterday had fall and landed on hand.  She currently lives with: husband Menopausal Symptoms: none  HAND INJURY  Fell down on stairs yesterday and landed with her hand in a fist, scratching the top of left hand and under wrist on pavement. Denies any flexion backwards of hand or twisting of wrist.  She denies pain to area.  Reports only bruising and an abrasion.   Duration: one day  Involved hand: left  Mechanism of injury: trauma  Location: dorsal  Onset: gradual  Severity: she reports no pain  Quality: reports no pain  Frequency:reports no pain  Radiation: no  Aggravating factors:  Alleviating factors:  Treatments attempted:  Relief with NSAIDs?: No NSAIDs Taken  Weakness: no  Numbness: no  Redness: no  Swelling:yes  Bruising: yes  Fevers: no    HYPOTHYROIDISM  Current Levothyroxine dose is 50 MCG.  Thyroid control status:stable  Satisfied with current treatment? yes  Medication side effects: no  Medication compliance: good compliance  Etiology of hypothyroidism:  Recent dose adjustment:no  Fatigue: none Cold intolerance: none Heat intolerance: none Weight gain: none Weight loss: none Constipation:none Diarrhea/loose stools: none Palpitations: none Lower extremity edema: none Anxiety/depressed mood: none   Depression Screen done today and results listed below:  [facility] 05/11/2019 05/01/2018 05/23/2017 04/16/2017 09/26/2015  Decreased Interest 0 0 0 0 2  Down, Depressed, Hopeless 0 0 0 0 3  PHQ - 2 Score 0 0 0 0 5  Altered sleeping 0 - - - 3   Tired, decreased energy 1 - - - 3  Change in appetite 0 - - - 3  Feeling bad or failure about yourself  0 - - - -  Trouble concentrating 0 - - - 3  Moving slowly or fidgety/restless 0 - - - 3  Suicidal thoughts 0 - - - 3  PHQ-9 Score 1 - - - 23  Difficult doing work/chores Not difficult at all - - - -    The patient has a history of falls. I did complete a risk assessment for falls. A plan of care for falls was documented.   Past Medical History:  Past Medical History:  Diagnosis Date  . Anemia   . Arthritis    joints  . Carpal tunnel syndrome    right arm  . Dyspnea   . GERD (gastroesophageal reflux disease)   . Hypothyroidism   . IBS (irritable bowel syndrome)   . Menopause   . Motion sickness   . Wears partial dentures    upper    Surgical History:  Past Surgical History:  Procedure Laterality Date  . APPENDECTOMY    . COLONOSCOPY WITH PROPOFOL N/A 01/01/2018   Procedure: COLONOSCOPY WITH PROPOFOL;  Surgeon: Toney ReilVanga, Rohini Reddy, MD;  [facility];  Service: Endoscopy;  Laterality: N/A;  speaks [place]  . UPPER GI ENDOSCOPY      Medications:  Current Outpatient Medications on File Prior to Visit  Medication Sig  .  levothyroxine (SYNTHROID, LEVOTHROID) 50 MCG tablet Take 1 tablet (50 mcg total) by mouth daily.   No current facility-administered medications on file prior to visit.     Allergies:  Allergies  Allergen Reactions  . Asa [Aspirin] Other (See Comments)    Makes blood thin    Social History:  Social History   Socioeconomic History  . Marital status: Married    Spouse name: Not on file  . Number of children: Not on file  . Years of education: Not on file  . Highest education level: Not on file  Occupational History  . Not on file  Social Needs  . Financial resource strain: Not on file  . Food insecurity    Worry: Not on file    Inability: Not on file  . Transportation needs    Medical: Not on file    Non-medical: Not on  file  Tobacco Use  . Smoking status: Never Smoker  . Smokeless tobacco: Never Used  Substance and Sexual Activity  . Alcohol use: No    Alcohol/week: 0.0 standard drinks    Comment: rarely  . Drug use: No  . Sexual activity: Never  Lifestyle  . Physical activity    Days per week: Not on file    Minutes per session: Not on file  . Stress: Not on file  Relationships  . Social Musician on phone: Not on file    Gets together: Not on file    Attends religious service: Not on file    Active member of club or organization: Not on file    Attends meetings of clubs or organizations: Not on file    Relationship status: Not on file  . Intimate partner violence    Fear of current or ex partner: Not on file    Emotionally abused: Not on file    Physically abused: Not on file    Forced sexual activity: Not on file  Other Topics Concern  . Not on file  Social History Narrative  . Not on file   Social History   Tobacco Use  Smoking Status Never Smoker  Smokeless Tobacco Never Used   Social History   Substance and Sexual Activity  Alcohol Use No  . Alcohol/week: 0.0 standard drinks   Comment: rarely    Family History:  History reviewed. No pertinent family history.  Past medical history, surgical history, medications, allergies, family history and social history reviewed with patient today and changes made to appropriate areas of the chart.   Review of Systems - Negative except hand bruising All other ROS negative except what is listed above and in the HPI.      Objective:    BP 121/78   Pulse 76   Temp 98.7 F (37.1 C) (Oral)   Ht 4' 9.48" (1.46 m)   Wt 118 lb (53.5 kg)   LMP  (LMP Unknown)   SpO2 100%   BMI 25.11 kg/m   Wt Readings from Last 3 Encounters:  05/11/19 118 lb (53.5 kg)  05/01/18 107 lb (48.5 kg)  01/01/18 105 lb (47.6 kg)    Physical Exam Vitals signs and nursing note reviewed.  Constitutional:      General: She is awake. She is  not in acute distress.    Appearance: She is well-developed. She is not ill-appearing.  HENT:     Head: Normocephalic.     Right Ear: Hearing, tympanic membrane, ear canal and external ear normal.  Left Ear: Tympanic membrane, ear canal and external ear normal.     Nose: Nose normal.     Mouth/Throat:     Mouth: Mucous membranes are moist.  Eyes:     General: Lids are normal.        Right eye: No discharge.        Left eye: No discharge.     Extraocular Movements: Extraocular movements intact.     Conjunctiva/sclera: Conjunctivae normal.     Pupils: Pupils are equal, round, and reactive to light.     Visual Fields: Right eye visual fields normal and left eye visual fields normal.  Neck:     Musculoskeletal: Normal range of motion and neck supple.     Thyroid: No thyromegaly.     Vascular: No carotid bruit or JVD.  Cardiovascular:     Rate and Rhythm: Normal rate and regular rhythm.     Heart sounds: Normal heart sounds. No murmur. No gallop.   Pulmonary:     Effort: Pulmonary effort is normal.     Breath sounds: Normal breath sounds.  Chest:     Breasts:        Right: Normal. No swelling, bleeding, inverted nipple, mass or nipple discharge.        Left: Normal. No swelling, bleeding, inverted nipple, mass or nipple discharge.  Abdominal:     General: Bowel sounds are normal.     Palpations: Abdomen is soft. There is no hepatomegaly or splenomegaly.     Hernia: There is no hernia in the left inguinal area or right inguinal area.  Genitourinary:    Exam position: Lithotomy position.     Labia:        Right: No rash or tenderness.        Left: No rash or tenderness.      Vagina: Normal.     Cervix: Normal.     Uterus: Normal.      Adnexa: Right adnexa normal and left adnexa normal.     Comments: Cervix viewed slightly to 2 o'clock position, anterior. Musculoskeletal:     Right hand: She exhibits normal range of motion, no tenderness, no laceration and no swelling. Normal  sensation noted. Normal strength noted.     Left hand: She exhibits swelling (mild to dorsal aspect). She exhibits normal range of motion and no tenderness. Normal sensation noted. Normal strength noted.     Right lower leg: No edema.     Left lower leg: No edema.     Comments: Bruise noted to dorsal aspect left hand extending from 3rd to 5th finger, pale purple.  Small crusted abrasions to inner aspect of left wrist.  Full ROM present of left hand and wrist with no tenderness on palpation.    Lymphadenopathy:     Cervical: No cervical adenopathy.     Upper Body:     Right upper body: No supraclavicular, axillary or pectoral adenopathy.     Left upper body: No supraclavicular, axillary or pectoral adenopathy.  Skin:    General: Skin is warm and dry.  Neurological:     Mental Status: She is alert and oriented to person, place, and time.     Deep Tendon Reflexes: Reflexes are normal and symmetric.  Psychiatric:        Attention and Perception: Attention normal.        Mood and Affect: Mood normal.        Speech: Speech normal.  Behavior: Behavior normal. Behavior is cooperative.        Thought Content: Thought content normal.        Cognition and Memory: Cognition normal.        Judgment: Judgment normal.     Results for orders placed or performed in visit on 09/15/18  TSH  Result Value Ref Range   TSH 0.173 (L) 0.450 - 4.500 uIU/mL      Assessment & Plan:   Problem List Items Addressed This Visit      Endocrine   Hypothyroid    Chronic, ongoing.  Continue current medication regimen and adjust as needed.  Recheck TSH today.      Relevant Orders   Comprehensive metabolic panel   Thyroid Panel With TSH     Other   Hand pain, left    Acute and improving, post fall.  Full ROM without pain.  Will obtain imaging.  Recommend ice to area as needed for swelling and Tylenol for pain as needed.  Return for worsening or continued symptoms.      Relevant Orders   DG Hand  Complete Left    Other Visit Diagnoses    Encounter for annual physical exam    -  Primary   Relevant Orders   CBC with Differential/Platelet   Comprehensive metabolic panel   Lipid Panel w/o Chol/HDL Ratio   VITAMIN D 25 Hydroxy (Vit-D Deficiency, Fractures)   HIV Antibody (routine testing w rflx)   Hepatitis C antibody   Cytology - PAP   Vitamin D deficiency       Check Vit D level   Relevant Orders   VITAMIN D 25 Hydroxy (Vit-D Deficiency, Fractures)   Screening cholesterol level       Check lipid panel   Relevant Orders   Lipid Panel w/o Chol/HDL Ratio       Follow up plan: Return in about 6 months (around 11/10/2019) for Thyroid.   LABORATORY TESTING:  - Pap smear: pap done  IMMUNIZATIONS:   - Tdap: Tetanus vaccination status reviewed: Td vaccination indicated and given today. - Influenza: Up to date - Pneumovax: Not applicable - Prevnar: Not applicable - HPV: Not applicable - Zostavax vaccine: Up to date  SCREENING: -Mammogram: Up to date  - Colonoscopy: Up to date  - Bone Density: Not applicable  -Hearing Test: Not applicable  -Spirometry: Not applicable   PATIENT COUNSELING:   Advised to take 1 mg of folate supplement per day if capable of pregnancy.   Sexuality: Discussed sexually transmitted diseases, partner selection, use of condoms, avoidance of unintended pregnancy  and contraceptive alternatives.   Advised to avoid cigarette smoking.  I discussed with the patient that most people either abstain from alcohol or drink within safe limits (<=14/week and <=4 drinks/occasion for males, <=7/weeks and <= 3 drinks/occasion for females) and that the risk for alcohol disorders and other health effects rises proportionally with the number of drinks per week and how often a drinker exceeds daily limits.  Discussed cessation/primary prevention of drug use and availability of treatment for abuse.   Diet: Encouraged to adjust caloric intake to maintain  or  achieve ideal body weight, to reduce intake of dietary saturated fat and total fat, to limit sodium intake by avoiding high sodium foods and not adding table salt, and to maintain adequate dietary potassium and calcium preferably from fresh fruits, vegetables, and low-fat dairy products.    stressed the importance of regular exercise  Injury prevention: Discussed safety  belts, safety helmets, smoke detector, smoking near bedding or upholstery.   Dental health: Discussed importance of regular tooth brushing, flossing, and dental visits.    NEXT PREVENTATIVE PHYSICAL DUE IN 1 YEAR. Return in about 6 months (around 11/10/2019) for Thyroid.

## 2019-05-12 LAB — COMPREHENSIVE METABOLIC PANEL
ALT: 15 IU/L (ref 0–32)
AST: 18 IU/L (ref 0–40)
Albumin/Globulin Ratio: 1.2 (ref 1.2–2.2)
Albumin: 4.4 g/dL (ref 3.8–4.9)
Alkaline Phosphatase: 88 IU/L (ref 39–117)
BUN/Creatinine Ratio: 19 (ref 12–28)
BUN: 13 mg/dL (ref 8–27)
Bilirubin Total: 0.3 mg/dL (ref 0.0–1.2)
CO2: 27 mmol/L (ref 20–29)
Calcium: 9.1 mg/dL (ref 8.7–10.3)
Chloride: 101 mmol/L (ref 96–106)
Creatinine, Ser: 0.69 mg/dL (ref 0.57–1.00)
GFR calc Af Amer: 109 mL/min/{1.73_m2} (ref >59)
GFR calc non Af Amer: 95 mL/min/{1.73_m2} (ref >59)
Globulin, Total: 3.8 g/dL (ref 1.5–4.5)
Glucose: 106 mg/dL — ABNORMAL HIGH (ref 65–99)
Potassium: 4.1 mmol/L (ref 3.5–5.2)
Sodium: 140 mmol/L (ref 134–144)
Total Protein: 8.2 g/dL (ref 6.0–8.5)

## 2019-05-12 LAB — CBC WITH DIFFERENTIAL/PLATELET
Basophils Absolute: 0.1 10*3/uL (ref 0.0–0.2)
Basos: 1 %
EOS (ABSOLUTE): 0.2 10*3/uL (ref 0.0–0.4)
Eos: 3 %
Hematocrit: 42.9 % (ref 34.0–46.6)
Hemoglobin: 14.1 g/dL (ref 11.1–15.9)
Immature Grans (Abs): 0 10*3/uL (ref 0.0–0.1)
Immature Granulocytes: 1 %
Lymphocytes Absolute: 2.1 10*3/uL (ref 0.7–3.1)
Lymphs: 34 %
MCH: 26.3 pg — ABNORMAL LOW (ref 26.6–33.0)
MCHC: 32.9 g/dL (ref 31.5–35.7)
MCV: 80 fL (ref 79–97)
Monocytes Absolute: 0.5 10*3/uL (ref 0.1–0.9)
Monocytes: 8 %
Neutrophils Absolute: 3.4 10*3/uL (ref 1.4–7.0)
Neutrophils: 53 %
Platelets: 242 10*3/uL (ref 150–450)
RBC: 5.36 x10E6/uL — ABNORMAL HIGH (ref 3.77–5.28)
RDW: 14.4 % (ref 11.7–15.4)
WBC: 6.3 10*3/uL (ref 3.4–10.8)

## 2019-05-12 LAB — THYROID PANEL WITH TSH
Free Thyroxine Index: 1.9 (ref 1.2–4.9)
T3 Uptake Ratio: 26 % (ref 24–39)
T4, Total: 7.4 ug/dL (ref 4.5–12.0)
TSH: 2.23 u[IU]/mL (ref 0.450–4.500)

## 2019-05-12 LAB — HEPATITIS C ANTIBODY: Hep C Virus Ab: <0.1 s/co ratio (ref 0.0–0.9)

## 2019-05-12 LAB — HIV ANTIBODY (ROUTINE TESTING W REFLEX): HIV Screen 4th Generation wRfx: NONREACTIVE

## 2019-05-12 LAB — LIPID PANEL W/O CHOL/HDL RATIO
Cholesterol, Total: 194 mg/dL (ref 100–199)
HDL: 43 mg/dL (ref >39)
LDL Calculated: 105 mg/dL — ABNORMAL HIGH (ref 0–99)
Triglycerides: 228 mg/dL — ABNORMAL HIGH (ref 0–149)
VLDL Cholesterol Cal: 46 mg/dL — ABNORMAL HIGH (ref 5–40)

## 2019-05-12 LAB — VITAMIN D 25 HYDROXY (VIT D DEFICIENCY, FRACTURES): Vit D, 25-Hydroxy: 26.5 ng/mL — ABNORMAL LOW (ref 30.0–100.0)

## 2019-05-13 LAB — CYTOLOGY - PAP
Diagnosis: NEGATIVE
HPV: NOT DETECTED

## 2019-05-13 NOTE — Progress Notes (Signed)
Normal test results noted.  Please call patient and make them aware of normal results and will continue to monitor at regular visits.  Have a great day.  Look forward to seeing you at your next visit.

## 2019-05-18 ENCOUNTER — Other Ambulatory Visit: Payer: Self-pay | Admitting: Family Medicine

## 2019-06-04 ENCOUNTER — Encounter: Payer: Self-pay | Admitting: Emergency Medicine

## 2019-06-04 ENCOUNTER — Other Ambulatory Visit: Payer: Self-pay

## 2019-06-04 ENCOUNTER — Emergency Department
Admission: EM | Admit: 2019-06-04 | Discharge: 2019-06-04 | Disposition: A | Discharge status: Home / Self Care | Payer: Worker's Compensation | Attending: Emergency Medicine | Admitting: Emergency Medicine

## 2019-06-04 DIAGNOSIS — T2602XA Burn of left eyelid and periocular area, initial encounter: Secondary | ICD-10-CM | POA: Diagnosis not present

## 2019-06-04 DIAGNOSIS — S0592XA Unspecified injury of left eye and orbit, initial encounter: Secondary | ICD-10-CM | POA: Diagnosis present

## 2019-06-04 DIAGNOSIS — Y990 Civilian activity done for income or pay: Secondary | ICD-10-CM | POA: Diagnosis not present

## 2019-06-04 DIAGNOSIS — T2010XA Burn of first degree of head, face, and neck, unspecified site, initial encounter: Secondary | ICD-10-CM

## 2019-06-04 DIAGNOSIS — X18XXXA Contact with other hot metals, initial encounter: Secondary | ICD-10-CM | POA: Diagnosis not present

## 2019-06-04 DIAGNOSIS — Y9289 Other specified places as the place of occurrence of the external cause: Secondary | ICD-10-CM | POA: Diagnosis not present

## 2019-06-04 DIAGNOSIS — Y9389 Activity, other specified: Secondary | ICD-10-CM | POA: Diagnosis not present

## 2019-06-04 DIAGNOSIS — E039 Hypothyroidism, unspecified: Secondary | ICD-10-CM | POA: Diagnosis not present

## 2019-06-04 DIAGNOSIS — Z79899 Other long term (current) drug therapy: Secondary | ICD-10-CM | POA: Diagnosis not present

## 2019-06-04 DIAGNOSIS — J45909 Unspecified asthma, uncomplicated: Secondary | ICD-10-CM | POA: Insufficient documentation

## 2019-06-04 MED ORDER — SILVER SULFADIAZINE 1 % EX CREA: TOPICAL_CREAM | CUTANEOUS | 0 refills | Status: DC

## 2019-06-04 NOTE — ED Provider Notes (Signed)
[facility] EMERGENCY DEPARTMENT Provider Note   CSN: [id number] Arrival date & time: 06/04/19  1658     History   Chief Complaint Chief Complaint  Patient presents with  . Eye Pain    HPI Katherine [name] is a 63 y.o. female presents to the emergency department for evaluation of left eye pain.  Patient states earlier this morning she was at work heating long strands of wire with a heat gun.  1 of the long hot strands of wire hit her left upper eyelid without making contact with the orbit.  No flying foreign bodies, small debris.  Patient states this is a long wire that made contact with her exterior upper eyelid.  She denies any pain in the left eyeball.  No foreign body sensation or vision changes.  No drainage.  She complains of a skin burn to the exterior portion of the left upper eyelid.  Patient has been applying ice to the area today.  No increasing pain or swelling.      HPI  Past Medical History:  Diagnosis Date  . Anemia   . Arthritis    joints  . Carpal tunnel syndrome    right arm  . Dyspnea   . GERD (gastroesophageal reflux disease)   . Hypothyroidism   . IBS (irritable bowel syndrome)   . Menopause   . Motion sickness   . Wears partial dentures    upper    Patient Active Problem List   Diagnosis Date Noted  . Hand pain, left 05/11/2019  . Rectal bleeding   . Asthma 07/09/2017  . Acute shoulder bursitis, right 05/23/2017  . Low back pain 05/23/2017  . Neuropathy 04/16/2017  . Depression 08/30/2015  . Menopause 07/21/2015  . Carpal tunnel syndrome 07/21/2015  . IBS (irritable bowel syndrome) 07/21/2015  . Hypothyroid 07/21/2015  . Hyperlipidemia 05/17/2014    Past Surgical History:  Procedure Laterality Date  . APPENDECTOMY    . COLONOSCOPY WITH PROPOFOL N/A 01/01/2018   Procedure: COLONOSCOPY WITH PROPOFOL;  Surgeon: Lin Landsman, MD;  Location: [facility];  Service: Endoscopy;  Laterality: N/A;  speaks [place]   . UPPER GI ENDOSCOPY       OB History   No obstetric history on file.      Home Medications    Prior to Admission medications   Medication Sig Start Date End Date Taking? Authorizing Provider  albuterol (VENTOLIN HFA) 108 (90 Base) MCG/ACT inhaler Inhale 2 puffs into the lungs every 6 (six) hours as needed for wheezing or shortness of breath. 05/11/19   Cannady, Henrine Screws T, NP  diclofenac sodium (VOLTAREN) 1 % GEL Apply 2 g topically as needed (three times daily as needed). 05/11/19   Marnee Guarneri T, NP  levothyroxine (SYNTHROID) 50 MCG tablet TAKE 1 TABLET BY MOUTH EVERY DAY 05/18/19   Guadalupe Maple, MD  silver sulfADIAZINE (SILVADENE) 1 % cream Apply to affected area daily 06/04/19 06/03/20  Duanne Guess, PA-C    Family History No family history on file.  Social History Social History   Tobacco Use  . Smoking status: Never Smoker  . Smokeless tobacco: Never Used  Substance Use Topics  . Alcohol use: No    Alcohol/week: 0.0 standard drinks    Comment: rarely  . Drug use: No     Allergies   Asa [aspirin]   Review of Systems Review of Systems  Constitutional: Negative for fever.  Eyes: Negative for photophobia, pain, discharge, redness,  itching and visual disturbance.  Skin: Positive for wound.     Physical Exam Updated Vital Signs BP (!) 149/85   Pulse 80   Temp 99.2 F (37.3 C) (Oral)   Resp 16   Ht 4\' 10"  (1.473 m)   Wt 52.2 kg   LMP  (LMP Unknown)   SpO2 98%   BMI 24.04 kg/m   Physical Exam Constitutional:      Appearance: She is well-developed.  HENT:     Head: Normocephalic and atraumatic.  Eyes:     General: Lids are everted, no foreign bodies appreciated. Vision grossly intact. Gaze aligned appropriately.        Left eye: No foreign body.     Extraocular Movements:     Right eye: Normal extraocular motion.     Left eye: Normal extraocular motion.     Conjunctiva/sclera: Conjunctivae normal.     Right eye: No exudate or hemorrhage.     Left eye: No exudate or hemorrhage.    Comments: Examination of the left eye shows no orbital irritation, sclera is clear no signs of any corneal abrasion.  Pupils equal reactive to light.  No sign of foreign body to the superior or inferior eyelids with eversion.  External portion of the left upper eyelid with small redness with no drainage.  Redness measures 2 x 2 mm and is mildly tender to palpation.  No deep laceration through the lid.  Neck:     Musculoskeletal: Normal range of motion.  Cardiovascular:     Rate and Rhythm: Normal rate.  Pulmonary:     Effort: Pulmonary effort is normal. No respiratory distress.  Musculoskeletal: Normal range of motion.  Skin:    General: Skin is warm.     Findings: No rash.  Neurological:     Mental Status: She is alert and oriented to person, place, and time.  Psychiatric:        Behavior: Behavior normal.        Thought Content: Thought content normal.      ED Treatments / Results  Labs (all labs ordered are listed, but only abnormal results are displayed) Labs Reviewed - No data to display  EKG None  Radiology No results found.  Procedures Procedures (including critical care time)  Medications Ordered in ED Medications - No data to display   Initial Impression / Assessment and Plan / ED Course  I have reviewed the triage vital signs and the nursing notes.  Pertinent labs & imaging results that were available during my care of the patient were reviewed by me and considered in my medical decision making (see chart for details).        63 year old female with small superficial burn to the external portion of the left upper eyelid, 2 x 2 mm.  No orbital irritation, discomfort, vision changes or eye redness.  No signs of corneal abrasion or foreign body.  This was a single large strand of wire that was heated and grazed her left exterior upper eyelid.  No signs of infection today but did give her antibiotic ointment.  She will  apply ice the area.  She understands signs and symptoms return to the ED for.  Final Clinical Impressions(s) / ED Diagnoses   Final diagnoses:  Superficial burn of face, initial encounter    ED Discharge Orders         Ordered    silver sulfADIAZINE (SILVADENE) 1 % cream     06/04/19 2012  Evon SlackGaines, Thomas C, PA-C 06/04/19 2026    Shaune PollackIsaacs, Cameron, MD 06/07/19 0530

## 2019-06-04 NOTE — ED Triage Notes (Signed)
Patient sent to ED after getting a piece of wire in her left eye at work. Having continued pain. States there is nothing in eye at this time that she can tell, however, says the wire was hot when it went into her eye.

## 2019-06-04 NOTE — Discharge Instructions (Signed)
Please apply silver sulfadiazine ointment to left upper eyelid daily for 7 days.  Return to the ER for any increasing pain, swelling warmth redness or vision changes.

## 2019-07-15 ENCOUNTER — Emergency Department: Payer: BLUE CROSS/BLUE SHIELD

## 2019-07-15 ENCOUNTER — Other Ambulatory Visit: Payer: Self-pay

## 2019-07-15 ENCOUNTER — Observation Stay
Admission: EM | Admit: 2019-07-15 | Discharge: 2019-07-16 | Disposition: A | Discharge status: Home / Self Care | Payer: BLUE CROSS/BLUE SHIELD | Attending: Internal Medicine | Admitting: Internal Medicine

## 2019-07-15 ENCOUNTER — Encounter: Payer: Self-pay | Admitting: Emergency Medicine

## 2019-07-15 ENCOUNTER — Ambulatory Visit: Payer: Self-pay

## 2019-07-15 ENCOUNTER — Ambulatory Visit: Payer: Self-pay | Admitting: *Deleted

## 2019-07-15 DIAGNOSIS — G5600 Carpal tunnel syndrome, unspecified upper limb: Secondary | ICD-10-CM | POA: Diagnosis not present

## 2019-07-15 DIAGNOSIS — Z791 Long term (current) use of non-steroidal anti-inflammatories (NSAID): Secondary | ICD-10-CM | POA: Insufficient documentation

## 2019-07-15 DIAGNOSIS — M1990 Unspecified osteoarthritis, unspecified site: Secondary | ICD-10-CM | POA: Insufficient documentation

## 2019-07-15 DIAGNOSIS — K589 Irritable bowel syndrome without diarrhea: Secondary | ICD-10-CM | POA: Diagnosis not present

## 2019-07-15 DIAGNOSIS — E785 Hyperlipidemia, unspecified: Secondary | ICD-10-CM | POA: Diagnosis not present

## 2019-07-15 DIAGNOSIS — I639 Cerebral infarction, unspecified: Principal | ICD-10-CM | POA: Insufficient documentation

## 2019-07-15 DIAGNOSIS — M47812 Spondylosis without myelopathy or radiculopathy, cervical region: Secondary | ICD-10-CM | POA: Diagnosis not present

## 2019-07-15 DIAGNOSIS — Z79899 Other long term (current) drug therapy: Secondary | ICD-10-CM | POA: Insufficient documentation

## 2019-07-15 DIAGNOSIS — K219 Gastro-esophageal reflux disease without esophagitis: Secondary | ICD-10-CM | POA: Insufficient documentation

## 2019-07-15 DIAGNOSIS — R202 Paresthesia of skin: Secondary | ICD-10-CM | POA: Diagnosis present

## 2019-07-15 DIAGNOSIS — Z20828 Contact with and (suspected) exposure to other viral communicable diseases: Secondary | ICD-10-CM | POA: Diagnosis not present

## 2019-07-15 DIAGNOSIS — E039 Hypothyroidism, unspecified: Secondary | ICD-10-CM | POA: Insufficient documentation

## 2019-07-15 DIAGNOSIS — Z79890 Hormone replacement therapy (postmenopausal): Secondary | ICD-10-CM | POA: Insufficient documentation

## 2019-07-15 LAB — URINALYSIS, COMPLETE (UACMP) WITH MICROSCOPIC
Bacteria, UA: NONE SEEN
Bilirubin Urine: NEGATIVE
Glucose, UA: NEGATIVE mg/dL
Hgb urine dipstick: NEGATIVE
Ketones, ur: NEGATIVE mg/dL
Leukocytes,Ua: NEGATIVE
Nitrite: NEGATIVE
Protein, ur: NEGATIVE mg/dL
Specific Gravity, Urine: 1.016 (ref 1.005–1.030)
pH: 6 (ref 5.0–8.0)

## 2019-07-15 LAB — CBC
HCT: 42.6 % (ref 36.0–46.0)
Hemoglobin: 13.4 g/dL (ref 12.0–15.0)
MCH: 25.8 pg — ABNORMAL LOW (ref 26.0–34.0)
MCHC: 31.5 g/dL (ref 30.0–36.0)
MCV: 81.9 fL (ref 80.0–100.0)
Platelets: 257 10*3/uL (ref 150–400)
RBC: 5.2 MIL/uL — ABNORMAL HIGH (ref 3.87–5.11)
RDW: 14.3 % (ref 11.5–15.5)
WBC: 10.9 10*3/uL — ABNORMAL HIGH (ref 4.0–10.5)
nRBC: 0 % (ref 0.0–0.2)

## 2019-07-15 LAB — TROPONIN I (HIGH SENSITIVITY): Troponin I (High Sensitivity): 3 ng/L (ref <18)

## 2019-07-15 LAB — BASIC METABOLIC PANEL
Anion gap: 7 (ref 5–15)
BUN: 13 mg/dL (ref 6–20)
CO2: 27 mmol/L (ref 22–32)
Calcium: 9.1 mg/dL (ref 8.9–10.3)
Chloride: 102 mmol/L (ref 98–111)
Creatinine, Ser: 0.58 mg/dL (ref 0.44–1.00)
GFR calc Af Amer: >60 mL/min (ref >60)
GFR calc non Af Amer: >60 mL/min (ref >60)
Glucose, Bld: 143 mg/dL — ABNORMAL HIGH (ref 70–99)
Potassium: 3.9 mmol/L (ref 3.5–5.1)
Sodium: 136 mmol/L (ref 135–145)

## 2019-07-15 LAB — GLUCOSE, CAPILLARY: Glucose-Capillary: 146 mg/dL — ABNORMAL HIGH (ref 70–99)

## 2019-07-15 MED ORDER — FAMOTIDINE 20 MG PO TABS
40.0000 mg | ORAL_TABLET | Freq: Once | ORAL | Status: AC
  Administered 2019-07-15: 40 mg via ORAL
  Filled 2019-07-15: qty 2

## 2019-07-15 MED ORDER — ASPIRIN 81 MG PO CHEW
162.0000 mg | CHEWABLE_TABLET | Freq: Once | ORAL | Status: AC
  Administered 2019-07-15: 162 mg via ORAL
  Filled 2019-07-15: qty 2

## 2019-07-15 NOTE — ED Triage Notes (Signed)
Pt arrives with complaints of left sided numbness for 1 week, intermittent shortness of breath that started Sunday. PT also reports intermittent chest pain but denies current pain. Pt a & o x 4 in triage. Pt able to feel sensation on both sides but reports the left side feels "lighter."

## 2019-07-15 NOTE — Telephone Encounter (Signed)
Patient husband called and said for 2 weeks his wife has experienced numbness to mainly her left arm and hand. The symptom comes and goes. She states at time the numbness travels into her left leg. When it happens she feels dizzy. She denies vision changes, and other neuro symptoms. Per protocol patient will go to ER for evaluation of her symptoms. Care advice read to husband. He verbalized understanding.   Reason for Disposition . [1] Numbness (i.e., loss of sensation) of the face, arm / hand, or leg / foot on one side of the body AND [2] sudden onset AND [3] brief (now gone)  Answer Assessment - Initial Assessment Questions 1. SYMPTOM: "What is the main symptom you are concerned about?" (e.g., weakness, numbness)     Numbness in left arm and sometimes into left leg 2. ONSET: "When did this start?" (minutes, hours, days; while sleeping)    2-3 weeks ago 3. LAST NORMAL: "When was the last time you were normal (no symptoms)?"    normal 4. PATTERN "Does this come and go, or has it been constant since it started?"  "Is it present now?"    Comes and goes 5. CARDIAC SYMPTOMS: "Have you had any of the following symptoms: chest pain, difficulty breathing, palpitations?"     no 6. NEUROLOGIC SYMPTOMS: "Have you had any of the following symptoms: headache, dizziness, vision loss, double vision, changes in speech, unsteady on your feet?"     Dizzy, headache 7. OTHER SYMPTOMS: "Do you have any other symptoms?"     none 8. PREGNANCY: "Is there any chance you are pregnant?" "When was your last menstrual period?"    N/A  Protocols used: NEUROLOGIC DEFICIT-A-AH

## 2019-07-15 NOTE — ED Notes (Signed)
Patient states feeling numb on left side of body, and has headache and when standing feels dizzy. Patient denies passing out or hitting head.

## 2019-07-15 NOTE — Telephone Encounter (Signed)
Husband is not on the DPR and the wife is at work. Advised him that she would have to give consent before I could talk with him. He voiced understanding. He will advise her to call back when she gets home related to her symptoms. Advised that if she is having severe symptoms to go to an urgent care or hospital. He voiced understanding. Routing to [employer].

## 2019-07-15 NOTE — ED Triage Notes (Signed)
First RN Note: Pt presents to ED with friend with c/o L sided numbness x 1 week, feeling like she has difficulty breathing and feeling like she is going to pass out. Pt is alert and visualized in no respiratory distress at this time.

## 2019-07-15 NOTE — ED Provider Notes (Signed)
[facility] Emergency Department Provider Note  ____________________________________________  Time seen: Approximately 10:14 PM  I have reviewed the triage vital signs and the nursing notes.   HISTORY  Chief Complaint Near Syncope and Shortness of Breath    HPI Katherine [name] is a 63 y.o. female with a history of GERD and motion sickness who complains of left-sided paresthesias in the arm and leg for the past 3 days.  Waxing and waning but continuous.  No aggravating or alleviating factors.  Denies change in balance or coordination or difficulty walking.  Feels like she still has full strength.  No vision changes but she does have a apical headache over this time that is not thunderclap in onset.  Denies neck pain or stiffness fevers or chills.  Reports that in the past when she was given aspirin she started having GI bleeding.  She does not have an allergy to aspirin.      Past Medical History:  Diagnosis Date  . Anemia   . Arthritis    joints  . Carpal tunnel syndrome    right arm  . Dyspnea   . GERD (gastroesophageal reflux disease)   . Hypothyroidism   . IBS (irritable bowel syndrome)   . Menopause   . Motion sickness   . Wears partial dentures    upper     Patient Active Problem List   Diagnosis Date Noted  . Hand pain, left 05/11/2019  . Rectal bleeding   . Asthma 07/09/2017  . Acute shoulder bursitis, right 05/23/2017  . Low back pain 05/23/2017  . Neuropathy 04/16/2017  . Depression 08/30/2015  . Menopause 07/21/2015  . Carpal tunnel syndrome 07/21/2015  . IBS (irritable bowel syndrome) 07/21/2015  . Hypothyroid 07/21/2015  . Hyperlipidemia 05/17/2014     Past Surgical History:  Procedure Laterality Date  . APPENDECTOMY    . COLONOSCOPY WITH PROPOFOL N/A 01/01/2018   Procedure: COLONOSCOPY WITH PROPOFOL;  Surgeon: Toney ReilVanga, Rohini Reddy, MD;  [facility];  Service: Endoscopy;  Laterality: N/A;  speaks Chad[place]otian   . UPPER GI ENDOSCOPY       Prior to Admission medications   Medication Sig Start Date End Date Taking? Authorizing Provider  albuterol (VENTOLIN HFA) 108 (90 Base) MCG/ACT inhaler Inhale 2 puffs into the lungs every 6 (six) hours as needed for wheezing or shortness of breath. 05/11/19   Cannady, Corrie DandyJolene T, NP  diclofenac sodium (VOLTAREN) 1 % GEL Apply 2 g topically as needed (three times daily as needed). 05/11/19   Aura Dialsannady, [name] T, NP  levothyroxine (SYNTHROID) 50 MCG tablet TAKE 1 TABLET BY MOUTH EVERY DAY 05/18/19   Steele Sizerrissman, Mark A, MD  silver sulfADIAZINE (SILVADENE) 1 % cream Apply to affected area daily 06/04/19 06/03/20  Evon SlackGaines, Thomas C, PA-C     Allergies Asa [aspirin]   No family history on file.  Social History Social History   Tobacco Use  . Smoking status: Never Smoker  . Smokeless tobacco: Never Used  Substance Use Topics  . Alcohol use: No    Alcohol/week: 0.0 standard drinks    Comment: rarely  . Drug use: No    Review of Systems  Constitutional:   No fever or chills.  ENT:   No sore throat. No rhinorrhea. Cardiovascular:   No chest pain or syncope. Respiratory:   No dyspnea or cough. Gastrointestinal:   Negative for abdominal pain, vomiting and diarrhea.  Musculoskeletal:   Negative for focal pain or swelling All other systems  reviewed and are negative except as documented above in ROS and HPI.  ____________________________________________   PHYSICAL EXAM:  VITAL SIGNS: ED Triage Vitals  Enc Vitals Group     BP 07/15/19 1753 (!) 165/89     Pulse Rate 07/15/19 1753 77     Resp 07/15/19 1753 16     Temp 07/15/19 1753 98.5 F (36.9 C)     Temp Source 07/15/19 1753 Oral     SpO2 07/15/19 1753 100 %     Weight 07/15/19 1755 114 lb (51.7 kg)     Height 07/15/19 1755 4\' 10"  (1.473 m)     Head Circumference --      Peak Flow --      Pain Score 07/15/19 1755 0     Pain Loc --      Pain Edu? --      Excl. in GC? --     Vital signs reviewed,  nursing assessments reviewed.   Constitutional:   Alert and oriented. Non-toxic appearance. Eyes:   Conjunctivae are normal. EOMI. PERRL.  No nystagmus or APD ENT      Head:   Normocephalic and atraumatic.      Nose:   No congestion/rhinnorhea.       Mouth/Throat:   MMM, no pharyngeal erythema. No peritonsillar mass.       Neck:   No meningismus. Full ROM. Hematological/Lymphatic/Immunilogical:   No cervical lymphadenopathy. Cardiovascular:   RRR. Symmetric bilateral radial and DP pulses.  No murmurs. Cap refill less than 2 seconds. Respiratory:   Normal respiratory effort without tachypnea/retractions. Breath sounds are clear and equal bilaterally. No wheezes/rales/rhonchi. Gastrointestinal:   Soft and nontender. Non distended. There is no CVA tenderness.  No rebound, rigidity, or guarding.  Musculoskeletal:   Normal range of motion in all extremities. No joint effusions.  No lower extremity tenderness.  No edema. Neurologic:   Normal speech and language.  Cranial nerves III through XII intact Relative sensory deficit of left arm and left leg diffusely Slightly diminished strength of the left arm and left leg relative to right. Intact finger-to-nose, no pronator drift. Mild asymmetry and heel-to-shin performance, worse on the left. NIH stroke scale 4  Skin:    Skin is warm, dry and intact. No rash noted.  No petechiae, purpura, or bullae.  ____________________________________________    LABS (pertinent positives/negatives) (all labs ordered are listed, but only abnormal results are displayed) Labs Reviewed  GLUCOSE, CAPILLARY - Abnormal; Notable for the following components:      Result Value   Glucose-Capillary 146 (*)    All other components within normal limits  BASIC METABOLIC PANEL - Abnormal; Notable for the following components:   Glucose, Bld 143 (*)    All other components within normal limits  CBC - Abnormal; Notable for the following components:   WBC 10.9 (*)     RBC 5.20 (*)    MCH 25.8 (*)    All other components within normal limits  SARS CORONAVIRUS 2  URINALYSIS, COMPLETE (UACMP) WITH MICROSCOPIC  CBG MONITORING, ED  TROPONIN I (HIGH SENSITIVITY)   ____________________________________________   EKG  Interpreted by me  Date: 07/15/2019  Rate: 70  Rhythm: normal sinus rhythm  QRS Axis: normal  Intervals: normal  ST/T Wave abnormalities: normal  Conduction Disutrbances: none  Narrative Interpretation: unremarkable      ____________________________________________    RADIOLOGY  No results found.  ____________________________________________   PROCEDURES Procedures  ____________________________________________  DIFFERENTIAL DIAGNOSIS   Intracranial hemorrhage,  acute ischemic stroke, multiple sclerosis  CLINICAL IMPRESSION / ASSESSMENT AND PLAN / ED COURSE  Medications ordered in the ED: Medications  famotidine (PEPCID) tablet 40 mg (40 mg Oral Given 07/15/19 2205)  aspirin chewable tablet 162 mg (162 mg Oral Given 07/15/19 2205)    Pertinent labs & imaging results that were available during my care of the patient were reviewed by me and considered in my medical decision making (see chart for details).  Caleb Prigmore was evaluated in Emergency Department on 07/15/2019 for the symptoms described in the history of present illness. She was evaluated in the context of the global COVID-19 pandemic, which necessitated consideration that the patient might be at risk for infection with the SARS-CoV-2 virus that causes COVID-19. Institutional protocols and algorithms that pertain to the evaluation of patients at risk for COVID-19 are in a state of rapid change based on information released by regulatory bodies including the CDC and federal and state organizations. These policies and algorithms were followed during the patient's care in the ED.   Patient presents with left-sided paresthesia.  On exam she also has some mild motor  deficit in the left arm and leg as well.  She is Lucianne Lei negative, onset of symptoms was a least 3 days ago, not a candidate for TPA or endovascular intervention.  Presentation is consistent with a acute ischemic stroke with relatively mild symptoms.  Patient agrees to hospitalization for further stroke work-up.  COVID screening ordered.  Discussed with hospitalist.  In consideration of her likely ischemic stroke, I will try a lower dose aspirin of 162 mg coupled with Pepcid to hopefully not trigger GI bleeding while still be able to protect her from further propagation of cerebral artery thrombosis.      ____________________________________________   FINAL CLINICAL IMPRESSION(S) / ED DIAGNOSES    Final diagnoses:  Acute ischemic stroke [facility])     ED Discharge Orders    None      Portions of this note were generated with dragon dictation software. Dictation errors may occur despite best attempts at proofreading.   Carrie Mew, MD 07/15/19 2218

## 2019-07-15 NOTE — ED Notes (Signed)
ED Provider at bedside. 

## 2019-07-16 ENCOUNTER — Observation Stay: Payer: BLUE CROSS/BLUE SHIELD

## 2019-07-16 ENCOUNTER — Other Ambulatory Visit: Payer: Self-pay

## 2019-07-16 ENCOUNTER — Observation Stay
Admit: 2019-07-16 | Discharge: 2019-07-16 | Disposition: A | Discharge status: Home / Self Care | Payer: BLUE CROSS/BLUE SHIELD | Attending: Internal Medicine | Admitting: Internal Medicine

## 2019-07-16 DIAGNOSIS — R202 Paresthesia of skin: Secondary | ICD-10-CM | POA: Diagnosis present

## 2019-07-16 DIAGNOSIS — K219 Gastro-esophageal reflux disease without esophagitis: Secondary | ICD-10-CM | POA: Diagnosis present

## 2019-07-16 LAB — LIPID PANEL
Cholesterol: 170 mg/dL (ref 0–200)
HDL: 43 mg/dL (ref >40)
LDL Cholesterol: 83 mg/dL (ref 0–99)
Total CHOL/HDL Ratio: 4 RATIO
Triglycerides: 218 mg/dL — ABNORMAL HIGH (ref <150)
VLDL: 44 mg/dL — ABNORMAL HIGH (ref 0–40)

## 2019-07-16 LAB — CBC
HCT: 38.6 % (ref 36.0–46.0)
Hemoglobin: 12.4 g/dL (ref 12.0–15.0)
MCH: 26.4 pg (ref 26.0–34.0)
MCHC: 32.1 g/dL (ref 30.0–36.0)
MCV: 82.1 fL (ref 80.0–100.0)
Platelets: 237 10*3/uL (ref 150–400)
RBC: 4.7 MIL/uL (ref 3.87–5.11)
RDW: 14.4 % (ref 11.5–15.5)
WBC: 8.3 10*3/uL (ref 4.0–10.5)
nRBC: 0 % (ref 0.0–0.2)

## 2019-07-16 LAB — ECHOCARDIOGRAM COMPLETE
Height: 58 in
Weight: 1827.17 oz

## 2019-07-16 LAB — HEMOGLOBIN A1C
Hgb A1c MFr Bld: 6.1 % — ABNORMAL HIGH (ref 4.8–5.6)
Mean Plasma Glucose: 128.37 mg/dL

## 2019-07-16 LAB — TSH: TSH: 4.442 u[IU]/mL (ref 0.350–4.500)

## 2019-07-16 LAB — CREATININE, SERUM
Creatinine, Ser: 0.63 mg/dL (ref 0.44–1.00)
GFR calc Af Amer: >60 mL/min (ref >60)
GFR calc non Af Amer: >60 mL/min (ref >60)

## 2019-07-16 MED ORDER — STROKE: EARLY STAGES OF RECOVERY BOOK
Freq: Once | Status: AC
  Administered 2019-07-16: 01:00:00

## 2019-07-16 MED ORDER — CLOPIDOGREL BISULFATE 75 MG PO TABS: 75.0000 mg | ORAL_TABLET | Freq: Every day | ORAL | 0 refills | Status: DC

## 2019-07-16 MED ORDER — ACETAMINOPHEN 325 MG PO TABS: 650.0000 mg | ORAL_TABLET | ORAL | Status: DC | PRN

## 2019-07-16 MED ORDER — ATORVASTATIN CALCIUM 20 MG PO TABS
40.0000 mg | ORAL_TABLET | Freq: Every day | ORAL | Status: DC
  Administered 2019-07-16: 17:00:00 40 mg via ORAL
  Filled 2019-07-16: qty 2

## 2019-07-16 MED ORDER — ASPIRIN 81 MG PO CHEW
81.0000 mg | CHEWABLE_TABLET | Freq: Every day | ORAL | Status: DC
  Administered 2019-07-16: 81 mg via ORAL
  Filled 2019-07-16: qty 1

## 2019-07-16 MED ORDER — ENOXAPARIN SODIUM 40 MG/0.4ML ~~LOC~~ SOLN: 40.0000 mg | SUBCUTANEOUS | Status: DC

## 2019-07-16 MED ORDER — LEVOTHYROXINE SODIUM 50 MCG PO TABS
50.0000 ug | ORAL_TABLET | Freq: Every day | ORAL | Status: DC
  Administered 2019-07-16: 50 ug via ORAL
  Filled 2019-07-16: qty 1

## 2019-07-16 MED ORDER — ATORVASTATIN CALCIUM 40 MG PO TABS: 40.0000 mg | ORAL_TABLET | Freq: Every day | ORAL | 0 refills | Status: DC

## 2019-07-16 MED ORDER — ACETAMINOPHEN 650 MG RE SUPP: 650.0000 mg | RECTAL | Status: DC | PRN

## 2019-07-16 MED ORDER — ACETAMINOPHEN 160 MG/5ML PO SOLN
650.0000 mg | ORAL | Status: DC | PRN
  Filled 2019-07-16: qty 20.3

## 2019-07-16 NOTE — H&P (Signed)
[place] [facility] [place] NAME: Katherine [name]    MR#:  [id number]  DATE OF BIRTH:  Jul 09, 1956  DATE OF ADMISSION:  07/15/2019  PRIMARY CARE PHYSICIAN: Guadalupe Maple, MD   REQUESTING/REFERRING PHYSICIAN: Joni Fears, MD  CHIEF COMPLAINT:   Chief Complaint  Patient presents with  . Near Syncope  . Shortness of Breath    HISTORY OF PRESENT ILLNESS:  Katherine [name]  is a 63 y.o. female who presents with chief complaint as above.  Patient presents the ED with a complaint of 4 days of intermittent but progressively worsening paresthesias of her left arm.  She states that she has had burning sensation, cold sensation, and tingling numbness.  Here in the ED work-up is largely within normal limits initially, but with some concern for possibility of stroke.  Hospitalist called for admission  PAST MEDICAL HISTORY:   Past Medical History:  Diagnosis Date  . Anemia   . Arthritis    joints  . Carpal tunnel syndrome    right arm  . Dyspnea   . GERD (gastroesophageal reflux disease)   . Hypothyroidism   . IBS (irritable bowel syndrome)   . Menopause   . Motion sickness   . Wears partial dentures    upper     PAST SURGICAL HISTORY:   Past Surgical History:  Procedure Laterality Date  . APPENDECTOMY    . COLONOSCOPY WITH PROPOFOL N/A 01/01/2018   Procedure: COLONOSCOPY WITH PROPOFOL;  Surgeon: Lin Landsman, MD;  Location: [place];  Service: Endoscopy;  Laterality: N/A;  speaks [place]  . UPPER GI ENDOSCOPY       SOCIAL HISTORY:   Social History   Tobacco Use  . Smoking status: Never Smoker  . Smokeless tobacco: Never Used  Substance Use Topics  . Alcohol use: No    Alcohol/week: 0.0 standard drinks    Comment: rarely     FAMILY HISTORY:    Family history reviewed and is non-contributory DRUG ALLERGIES:   Allergies  Allergen Reactions  . Asa [Aspirin] Other (See Comments)    Makes blood thin     MEDICATIONS AT HOME:   Prior to Admission medications   Medication Sig Start Date End Date Taking? Authorizing Provider  albuterol (VENTOLIN HFA) 108 (90 Base) MCG/ACT inhaler Inhale 2 puffs into the lungs every 6 (six) hours as needed for wheezing or shortness of breath. 05/11/19  Yes Cannady, [name] T, NP  diclofenac sodium (VOLTAREN) 1 % GEL Apply 2 g topically as needed (three times daily as needed). 05/11/19  Yes Cannady, Henrine Screws T, NP  levothyroxine (SYNTHROID) 50 MCG tablet TAKE 1 TABLET BY MOUTH EVERY DAY 05/18/19  Yes Crissman, Jeannette How, MD  silver sulfADIAZINE (SILVADENE) 1 % cream Apply to affected area daily Patient not taking: Reported on 07/15/2019 06/04/19 06/03/20  Duanne Guess, PA-C    REVIEW OF SYSTEMS:  Review of Systems  Constitutional: Negative for chills, fever, malaise/fatigue and weight loss.  HENT: Negative for ear pain, hearing loss and tinnitus.   Eyes: Negative for blurred vision, double vision, pain and redness.  Respiratory: Negative for cough, hemoptysis and shortness of breath.   Cardiovascular: Negative for chest pain, palpitations, orthopnea and leg swelling.  Gastrointestinal: Negative for abdominal pain, constipation, diarrhea, nausea and vomiting.  Genitourinary: Negative for dysuria, frequency and hematuria.  Musculoskeletal: Negative for back pain, joint pain and neck pain.  Skin:       No acne, rash,  or lesions  Neurological: Positive for sensory change. Negative for dizziness, tremors, focal weakness and weakness.  Endo/Heme/Allergies: Negative for polydipsia. Does not bruise/bleed easily.  Psychiatric/Behavioral: Negative for depression. The patient is not nervous/anxious and does not have insomnia.      VITAL SIGNS:   Vitals:   07/15/19 2130 07/15/19 2200 07/15/19 2245 07/15/19 2300  BP: 131/82 (!) 152/76 (!) 148/88 133/84  Pulse: (!) 59 65 88 (!) 55  Resp:      Temp:      TempSrc:      SpO2: 100% 100% 98% 98%  Weight:      Height:        Wt Readings from Last 3 Encounters:  07/15/19 51.7 kg  06/04/19 52.2 kg  05/11/19 53.5 kg    PHYSICAL EXAMINATION:  Physical Exam  Vitals reviewed. Constitutional: She is oriented to person, place, and time. She appears well-developed and well-nourished. No distress.  HENT:  Head: Normocephalic and atraumatic.  Mouth/Throat: Oropharynx is clear and moist.  Eyes: Pupils are equal, round, and reactive to light. Conjunctivae and EOM are normal. No scleral icterus.  Neck: Normal range of motion. Neck supple. No JVD present. No thyromegaly present.  Cardiovascular: Normal rate, regular rhythm and intact distal pulses. Exam reveals no gallop and no friction rub.  No murmur heard. Respiratory: Effort normal and breath sounds normal. No respiratory distress. She has no wheezes. She has no rales.  GI: Soft. Bowel sounds are normal. She exhibits no distension. There is no abdominal tenderness.  Musculoskeletal: Normal range of motion.        General: No edema.     Comments: No arthritis, no gout  Lymphadenopathy:    She has no cervical adenopathy.  Neurological: She is alert and oriented to person, place, and time. No cranial nerve deficit.  Neurologic: Cranial nerves II-XII intact, Sensation intact to light touch except for left upper extremity where patient endorses tingling and decreased sensation to light touch, 5/5 strength in all extremities, no dysarthria, no aphasia, no dysphagia, memory intact, finger to nose testing showed no abnormality, no pronator drift  Skin: Skin is warm and dry. No rash noted. No erythema.  Psychiatric: She has a normal mood and affect. Her behavior is normal. Judgment and thought content normal.    LABORATORY PANEL:   CBC Recent Labs  Lab 07/15/19 1758  WBC 10.9*  HGB 13.4  HCT 42.6  PLT 257   ------------------------------------------------------------------------------------------------------------------  Chemistries  Recent Labs  Lab  07/15/19 1758  NA 136  K 3.9  CL 102  CO2 27  GLUCOSE 143*  BUN 13  CREATININE 0.58  CALCIUM 9.1   ------------------------------------------------------------------------------------------------------------------  Cardiac Enzymes No results for input(s): TROPONINI in the last 168 hours. ------------------------------------------------------------------------------------------------------------------  RADIOLOGY:  Ct Head Wo Contrast  Result Date: 07/15/2019 CLINICAL DATA:  63 year old presenting with one-week history of headaches and dizziness associated with LEFT body numbness. EXAM: CT HEAD WITHOUT CONTRAST TECHNIQUE: Contiguous axial images were obtained from the base of the skull through the vertex without intravenous contrast. COMPARISON:  No prior head CT.  MRI brain 10/15/2011. FINDINGS: Brain: Ventricular system normal in size and appearance for age. No mass lesion. No midline shift. No acute hemorrhage or hematoma. No extra-axial fluid collections. No evidence of acute infarction. Note is made of an empty sella. Vascular: No hyperdense vessel.  No visible atherosclerosis. Skull: No skull fracture or other focal osseous abnormality involving the skull. Sinuses/Orbits: Visualized paranasal sinuses, bilateral mastoid air cells and  bilateral middle ear cavities well-aerated. Visualized orbits and globes normal in appearance. Other: None. IMPRESSION: No acute intracranial abnormality. Electronically Signed   By: Hulan Saashomas  Lawrence M.D.   On: 07/15/2019 22:31    EKG:   Orders placed or performed during the hospital encounter of 07/15/19  . EKG 12-Lead  . EKG 12-Lead  . ED EKG  . ED EKG    IMPRESSION AND PLAN:  Principal Problem:   Paresthesias -unclear absolute etiology.  CT head was negative for bleed.  Concern for possibility of stroke or other central nervous system related pathology.  Admit per stroke/TIA admission order set with corresponding labs, imaging, consults including  neurology consult Active Problems:   Hypothyroid -home dose thyroid replacement   Hyperlipidemia -home dose antilipid   GERD (gastroesophageal reflux disease) -home dose PPI   Chart review performed and case discussed with ED provider. Labs, imaging and/or ECG reviewed by provider and discussed with patient/family. Management plans discussed with the patient and/or family.  COVID-19 status: Pending  DVT PROPHYLAXIS: SubQ lovenox   GI PROPHYLAXIS:  PPI  ADMISSION STATUS: Observation  CODE STATUS: Full  TOTAL TIME TAKING CARE OF THIS PATIENT: 40 minutes.   This patient was evaluated in the context of the global COVID-19 pandemic, which necessitated consideration that the patient might be at risk for infection with the SARS-CoV-2 virus that causes COVID-19. Institutional protocols and algorithms that pertain to the evaluation of patients at risk for COVID-19 are in a state of rapid change based on information released by regulatory bodies including the CDC and federal and state organizations. These policies and algorithms were followed to the best of this provider's knowledge to date during the patient's care at this facility.  Barney Drainavid F Mohini Heathcock 07/16/2019, 12:22 AM  [facility]  Office  [phone]  CC: Primary care physician; Steele Sizerrissman, Mark A, MD  Note:  This document was prepared using Dragon voice recognition software and may include unintentional dictation errors.

## 2019-07-16 NOTE — Evaluation (Signed)
Physical Therapy Evaluation Patient Details Name: Katherine [name] MRN: [id number] DOB: 1956/09/27 Today's Date: 07/16/2019   History of Present Illness  63 y.o. female who presents with chief complaint of L side weakness/numbness.  Patient presents w/ complaint of 4 days of intermittent but progressively worsening paresthesias of her left arm.  She states that she has had burning sensation, cold sensation, and tingling numbness.   Pt did well with PT exam, still having numbness in L U&LEs but functional with all tasks and no c/o pain.  She displayed only minimal decreased finger opposition coordination, good EC alternating finger to nose b/l.  Pt showed appropriate and safe balance with good tolerance and reports being near her baseline.  Able to ambulate w/o AD and had no functional issues apart from mild weakness in L U&LEs.      Follow Up Recommendations No PT follow up    Equipment Recommendations  None recommended by PT    Recommendations for Other Services       Precautions / Restrictions Restrictions Weight Bearing Restrictions: No      Mobility  Bed Mobility Overal bed mobility: Independent             General bed mobility comments: able to rise w/o assist, good safety/confidence  Transfers Overall transfer level: Independent Equipment used: None             General transfer comment: Pt able to rise to standing confidently and w/o issue  Ambulation/Gait Ambulation/Gait assistance: Modified independent (Device/Increase time) Gait Distance (Feet): 75 Feet Assistive device: None       General Gait Details: Pt able to walk in room w/o issue, showed good balance and confidence  Stairs            Wheelchair Mobility    Modified Rankin (Stroke Patients Only)       Balance Overall balance assessment: Modified Independent(SLS b/l >10 sec, heel raises w/o UEs, EC NBOS w/o issue)                                            Pertinent Vitals/Pain Pain Assessment: No/denies pain    Home Living Family/patient expects to be discharged to:: Private residence Living Arrangements: Spouse/significant other     Home Access: Stairs to enter   Technical brewer of Steps: 3   Home Equipment: None      Prior Function Level of Independence: Independent         Comments: Pt drives, works (repeated manual motion, seated), able to be independent     Hand Dominance   Dominant Hand: Right    Extremity/Trunk Assessment   Upper Extremity Assessment Upper Extremity Assessment: LUE deficits/detail;Overall [facility] for tasks assessed LUE Deficits / Details: pt with numbness (no pain) in L UE, grossly shoulder 4/5, distally 4-/5 LUE Sensation: decreased light touch LUE Coordination: (only very minimally decreased finger opposition)    Lower Extremity Assessment Lower Extremity Assessment: (R WNL, L grossly 4-/5)       Communication   Communication: Prefers language other than [place];No difficulties  Cognition   Behavior During Therapy: WFL for tasks assessed/performed Overall Cognitive Status: Within Functional Limits for tasks assessed  General Comments      Exercises     Assessment/Plan    PT Assessment Patent does not need any further PT services  PT Problem List         PT Treatment Interventions      PT Goals (Current goals can be found in the Care Plan section)  Acute Rehab PT Goals Patient Stated Goal: figure out what is causing L side numbness PT Goal Formulation: All assessment and education complete, DC therapy    Frequency     Barriers to discharge        Co-evaluation               AM-PAC PT "6 Clicks" Mobility  Outcome Measure Help needed turning from your back to your side while in a flat bed without using bedrails?: None Help needed moving from lying on your back to sitting on the side of a flat bed  without using bedrails?: None Help needed moving to and from a bed to a chair (including a wheelchair)?: None Help needed standing up from a chair using your arms (e.g., wheelchair or bedside chair)?: None Help needed to walk in hospital room?: None Help needed climbing 3-5 steps with a railing? : None 6 Click Score: 24    End of Session   Activity Tolerance: Patient tolerated treatment well Patient left: in bed;with call bell/phone within reach;with family/visitor present   PT Visit Diagnosis: Muscle weakness (generalized) (M62.81);Other symptoms and signs involving the nervous system (R29.898)    Time: [id number] PT Time Calculation (min) (ACUTE ONLY): 24 min   Charges:   PT Evaluation $PT Eval Low Complexity: 1 Low          Malachi ProGalen R Fahad Cisse, DPT 07/16/2019, 1:04 PM

## 2019-07-16 NOTE — Progress Notes (Signed)
   07/16/19 1000  Clinical Encounter Type  Visited With Patient and family together  Visit Type Initial  Ch was rounding. Pt was with an in law (daughter-in-law's mother) and was just done with PT. Pt and in-law together shared a photo of their grandson and talked about how their grandson is growing up to be a bilingual as the pt speaks another language. Pt hopes to go home soon as she did well in PT. The visit was appreciated.

## 2019-07-16 NOTE — Progress Notes (Signed)
Patient discharged home per MD order. All discharge instructions given and all questions answered. 

## 2019-07-16 NOTE — Plan of Care (Signed)
  Problem: Education: Goal: Knowledge of secondary prevention will improve Outcome: Progressing   Problem: Coping: Goal: Will identify appropriate support needs Outcome: Progressing   Problem: Health Behavior/Discharge Planning: Goal: Ability to manage health-related needs will improve Outcome: Progressing   Problem: Self-Care: Goal: Verbalization of feelings and concerns over difficulty with self-care will improve Outcome: Progressing   Problem: Nutrition: Goal: Risk of aspiration will decrease Outcome: Progressing

## 2019-07-16 NOTE — ED Notes (Signed)
ED TO INPATIENT HANDOFF REPORT  ED Nurse Name and Phone #: Cala BradfordKimberly [phone]  S Name/Age/Gender Katherine [name] 63 y.o. female Room/Bed: ED07A/ED07A  Code Status   Code Status: Not on file  Home/SNF/Other Home Patient oriented to: self, place, time and situation Is this baseline? Yes   Triage Complete: Triage complete  Chief Complaint numbness  Triage Note First RN Note: Pt presents to ED with friend with c/o L sided numbness x 1 week, feeling like she has difficulty breathing and feeling like she is going to pass out. Pt is alert and visualized in no respiratory distress at this time.   Pt arrives with complaints of left sided numbness for 1 week, intermittent shortness of breath that started Sunday. PT also reports intermittent chest pain but denies current pain. Pt a & o x 4 in triage. Pt able to feel sensation on both sides but reports the left side feels "lighter."   Allergies Allergies  Allergen Reactions  . Asa [Aspirin] Other (See Comments)    Makes blood thin    Level of Care/Admitting Diagnosis ED Disposition    ED Disposition Condition Comment   Admit  Hospital Area: [facility] [[id number]]  Level of Care: Med-Surg [16]  Covid Evaluation: Asymptomatic Screening Protocol (No Symptoms)  Diagnosis: Paresthesias [227675]  Admitting Physician: WILLIS, DAVID [[id number]]  Attending Physician: WILLIS, DAVID [[id number]]  PT Class (Do Not Modify): Observation [104]  PT Acc Code (Do Not Modify): Observation [10022]       B Medical/Surgery History Past Medical History:  Diagnosis Date  . Anemia   . Arthritis    joints  . Carpal tunnel syndrome    right arm  . Dyspnea   . GERD (gastroesophageal reflux disease)   . Hypothyroidism   . IBS (irritable bowel syndrome)   . Menopause   . Motion sickness   . Wears partial dentures    upper   Past Surgical History:  Procedure Laterality Date  . APPENDECTOMY    . COLONOSCOPY WITH PROPOFOL N/A  01/01/2018   Procedure: COLONOSCOPY WITH PROPOFOL;  Surgeon: Vanga, Rohini Reddy, MD;  Location: [facility];  Service: Endoscopy;  Laterality: N/A;  speaks Laotian  . UPPER GI ENDOSCOPY       A IV Location/Drains/Wounds Patient Lines/Drains/Airways Status   Active Line/Drains/Airways    Name:   Placement date:   Placement time:   Site:   Days:   Peripheral IV 07/15/19 Right Antecubital   07/15/19    2304    Antecubital   1   Incision (Closed) 01/01/18 Rectum Other (Comment)   01/01/18    1102     56 1          Intake/Output Last 24 hours No intake or output data in the 24 hours ending 07/16/19 0029  Labs/Imaging Results for orders placed or performed during the hospital encounter of 07/15/19 (from the past 48 hour(s))  Glucose, capillary     Status: Abnormal   Collection Time: 07/15/19  5:53 PM  Result Value Ref Range   Glucose-Capillary 146 (H) 70 - 99 mg/dL  Basic metabolic panel     Status: Abnormal   Collection Time: 07/15/19  5:58 PM  Result Value Ref Range   Sodium 136 135 - 145 mmol/L   Potassium 3.9 3.5 - 5.1 mmol/L   Chloride 102 98 - 111 mmol/L   CO2 27 22 - 32 mmol/L   Glucose, Bld 143 (H) 70 - 99 mg/dL   BUN  13 6 - 20 mg/dL   Creatinine, Ser 6.960.58 0.44 - 1.00 mg/dL   Calcium 9.1 8.9 - 29.510.3 mg/dL   GFR calc non Af Amer >60 >60 mL/min   GFR calc Af Amer >60 >60 mL/min   Anion gap 7 5 - 15    Comment: Performed at [facility], [address] [place], [place] [address]  CBC     Status: Abnormal   Collection Time: 07/15/19  5:58 PM  Result Value Ref Range   WBC 10.9 (H) 4.0 - 10.5 K/uL   RBC 5.20 (H) 3.87 - 5.11 MIL/uL   Hemoglobin 13.4 12.0 - 15.0 g/dL   HCT 24.442.6 01.036.0 - 27.246.0 %   MCV 81.9 80.0 - 100.0 fL   MCH 25.8 (L) 26.0 - 34.0 pg   MCHC 31.5 30.0 - 36.0 g/dL   RDW 53.614.3 64.411.5 - 03.415.5 %   Platelets 257 150 - 400 K/uL   nRBC 0.0 0.0 - 0.2 %    Comment: Performed at [facility], [address] [place], [place] [address]   Troponin I (High Sensitivity)     Status: None   Collection Time: 07/15/19  5:58 PM  Result Value Ref Range   Troponin I (High Sensitivity) 3 <18 ng/L    Comment: (NOTE) Elevated high sensitivity troponin I (hsTnI) values and significant  changes across serial measurements may suggest ACS but many other  chronic and acute conditions are known to elevate hsTnI results.  Refer to the "Links" section for chest pain algorithms and additional  guidance. Performed at [facility], [address] [place], [place] [address]   Urinalysis, Complete w Microscopic     Status: Abnormal   Collection Time: 07/15/19 10:07 PM  Result Value Ref Range   Color, Urine YELLOW (A) YELLOW   APPearance CLEAR (A) CLEAR   Specific Gravity, Urine 1.016 1.005 - 1.030   pH 6.0 5.0 - 8.0   Glucose, UA NEGATIVE NEGATIVE mg/dL   Hgb urine dipstick NEGATIVE NEGATIVE   Bilirubin Urine NEGATIVE NEGATIVE   Ketones, ur NEGATIVE NEGATIVE mg/dL   Protein, ur NEGATIVE NEGATIVE mg/dL   Nitrite NEGATIVE NEGATIVE   Leukocytes,Ua NEGATIVE NEGATIVE   RBC / HPF 0-5 0 - 5 RBC/hpf   WBC, UA 0-5 0 - 5 WBC/hpf   Bacteria, UA NONE SEEN NONE SEEN   Squamous Epithelial / LPF 0-5 0 - 5   Mucus PRESENT    Ca Oxalate Crys, UA PRESENT     Comment: Performed at [facility], [address] [place], [place] [address]   Ct Head Wo Contrast  Result Date: 07/15/2019 CLINICAL DATA:  63 year old presenting with one-week history of headaches and dizziness associated with LEFT body numbness. EXAM: CT HEAD WITHOUT CONTRAST TECHNIQUE: Contiguous axial images were obtained from the base of the skull through the vertex without intravenous contrast. COMPARISON:  No prior head CT.  MRI brain 10/15/2011. FINDINGS: Brain: Ventricular system normal in size and appearance for age. No mass lesion. No midline shift. No acute hemorrhage or hematoma. No extra-axial fluid collections. No evidence of acute infarction. 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 Vascular: No hyperdense vessel.  No visible atherosclerosis. Skull: No skull fracture or other focal osseous abnormality involving the skull. Sinuses/Orbits: Visualized paranasal sinuses, bilateral mastoid air cells and bilateral middle ear cavities well-aerated. Visualized orbits and globes normal in appearance. Other: None. IMPRESSION: No acute intracranial abnormality. Electronically Signed   By: Kayren Eaveshomas  Lawrence M.D.  On: 07/15/2019 22:31    Pending Labs Unresulted Labs (From admission, onward)    Start     Ordered   07/15/19 2150  SARS CORONAVIRUS 2 Nasal Swab Aptima Multi Swab  (Asymptomatic/Tier 2 Patients Labs)  Once,   STAT    Question Answer Comment  Is this test for diagnosis or screening Screening   Symptomatic for COVID-19 as defined by CDC No   Hospitalized for COVID-19 No   Admitted to ICU for COVID-19 No   Previously tested for COVID-19 No   Resident in a congregate (group) care setting No   Employed in healthcare setting No   Pregnant No      07/15/19 2150   Signed and Held  Hemoglobin A1c  Tomorrow morning,   R     Signed and Held   Signed and Held  Lipid panel  Tomorrow morning,   R    Comments: Fasting    Signed and Held   Signed and Held  CBC  (enoxaparin (LOVENOX)    CrCl >/= 30 ml/min)  Once,   R    Comments: Baseline for enoxaparin therapy IF NOT ALREADY DRAWN.  Notify MD if PLT < 100 K.    Signed and Held   Signed and Held  Creatinine, serum  (enoxaparin (LOVENOX)    CrCl >/= 30 ml/min)  Once,   R    Comments: Baseline for enoxaparin therapy IF NOT ALREADY DRAWN.    Signed and Held   Signed and Held  Creatinine, serum  (enoxaparin (LOVENOX)    CrCl >/= 30 ml/min)  Weekly,   R    Comments: while on enoxaparin therapy    Signed and Held          Vitals/Pain Today's Vitals   07/15/19 2130 07/15/19 2200 07/15/19 2245 07/15/19 2300  BP: 131/82 (!) 152/76 (!) 148/88 133/84  Pulse: (!) 59 65 88 (!) 55  Resp:      Temp:      TempSrc:       SpO2: 100% 100% 98% 98%  Weight:      Height:      PainSc:        Isolation Precautions No active isolations  Medications Medications  famotidine (PEPCID) tablet 40 mg (40 mg Oral Given 07/15/19 2205)  aspirin chewable tablet 162 mg (162 mg Oral Given 07/15/19 2205)    Mobility walks Low fall risk   Focused Assessments Neuro Assessment Handoff:  Swallow screen pass? Yes          Neuro Assessment:   Neuro Checks:      Last Documented NIHSS Modified Score:   Has TPA been given? No If patient is a Neuro Trauma and patient is going to OR before floor call report to [place] nurse: [phone] or [phone]     R Recommendations: See Admitting Provider Note  Report given to:   Additional Notes:

## 2019-07-16 NOTE — Consult Note (Signed)
Reason for Consult: L side numbness  Referring Physician: Dr. Elisabeth PigeonVachhani   CC: L sided numbness   HPI: Katherine [name] is an 63 y.o. female ho presents with  4 days of intermittent but progressively worsening paresthesias of her left arm and left leg.  She states that she has had burning sensation, cold sensation, and tingling numbness.  Slight weakness of LUE grip.   Past Medical History:  Diagnosis Date  . Anemia   . Arthritis    joints  . Carpal tunnel syndrome    right arm  . Dyspnea   . GERD (gastroesophageal reflux disease)   . Hypothyroidism   . IBS (irritable bowel syndrome)   . Menopause   . Motion sickness   . Wears partial dentures    upper    Past Surgical History:  Procedure Laterality Date  . APPENDECTOMY    . COLONOSCOPY WITH PROPOFOL N/A 01/01/2018   Procedure: COLONOSCOPY WITH PROPOFOL;  Surgeon: Toney ReilVanga, Rohini Reddy, MD;  Location[facility];  Service: Endoscopy;  Laterality: N/A;  speaks ChadLaotian  . UPPER GI ENDOSCOPY      No family history on file.  Social History:  reports that she has never smoked. She has never used smokeless tobacco. She reports that she does not drink alcohol or use drugs.  Allergies  Allergen Reactions  . Asa [Aspirin] Other (See Comments)    Makes blood thin    Medications: I have reviewed the patient's current medications.  ROS: History obtained from the patient  General ROS: negative for - chills, fatigue, fever, night sweats, weight gain or weight loss Psychological ROS: negative for - behavioral disorder, hallucinations, memory difficulties, mood swings or suicidal ideation Ophthalmic ROS: negative for - blurry vision, double vision, eye pain or loss of vision ENT ROS: negative for - epistaxis, nasal discharge, oral lesions, sore throat, tinnitus or vertigo Allergy and Immunology ROS: negative for - hives or itchy/watery eyes Hematological and Lymphatic ROS: negative for - bleeding problems, bruising or swollen  lymph nodes Endocrine ROS: negative for - galactorrhea, hair pattern changes, polydipsia/polyuria or temperature intolerance Respiratory ROS: negative for - cough, hemoptysis, shortness of breath or wheezing Cardiovascular ROS: negative for - chest pain, dyspnea on exertion, edema or irregular heartbeat Gastrointestinal ROS: negative for - abdominal pain, diarrhea, hematemesis, nausea/vomiting or stool incontinence Genito-Urinary ROS: negative for - dysuria, hematuria, incontinence or urinary frequency/urgency Musculoskeletal ROS: negative for - joint swelling or muscular weakness Neurological ROS: as noted in HPI Dermatological ROS: negative for rash and skin lesion changes  Physical Examination: Blood pressure 124/86, pulse 61, temperature 98 F (36.7 C), temperature source Oral, resp. rate 18, height 4\' 10"  (1.473 m), weight 51.8 kg, SpO2 98 %.    Neurological Examination   Mental Status: Alert, oriented, thought content appropriate.  Speech fluent without evidence of aphasia.  Able to follow 3 step commands without difficulty. Cranial Nerves: II: Discs flat bilaterally; Visual fields grossly normal, pupils equal, round, reactive to light and accommodation III,IV, VI: ptosis not present, extra-ocular motions intact bilaterally V,VII: smile symmetric, facial light touch sensation normal bilaterally VIII: hearing normal bilaterally IX,X: gag reflex present XI: bilateral shoulder shrug XII: midline tongue extension Motor: Right : Upper extremity   5/5    Left:     Upper extremity   Decreased grip strength.   Lower extremity   5/5     Lower extremity   5/5 Tone and bulk:normal tone throughout; no atrophy noted Sensory: numbness LUE and LLE  Deep Tendon Reflexes: 2+ and symmetric throughout Plantars: Right: downgoing   Left: downgoing Cerebellar: normal finger-to-nose, normal rapid alternating movements and normal heel-to-shin test Gait: normal gait and station      Laboratory  Studies:   Basic Metabolic Panel: Recent Labs  Lab 07/15/19 1758 07/16/19 0523  NA 136  --   K 3.9  --   CL 102  --   CO2 27  --   GLUCOSE 143*  --   BUN 13  --   CREATININE 0.58 0.63  CALCIUM 9.1  --     Liver Function Tests: No results for input(s): AST, ALT, ALKPHOS, BILITOT, PROT, ALBUMIN in the last 168 hours. No results for input(s): LIPASE, AMYLASE in the last 168 hours. No results for input(s): AMMONIA in the last 168 hours.  CBC: Recent Labs  Lab 07/15/19 1758 07/16/19 0523  WBC 10.9* 8.3  HGB 13.4 12.4  HCT 42.6 38.6  MCV 81.9 82.1  PLT 257 237    Cardiac Enzymes: No results for input(s): CKTOTAL, CKMB, CKMBINDEX, TROPONINI in the last 168 hours.  BNP: Invalid input(s): POCBNP  CBG: Recent Labs  Lab 07/15/19 1753  GLUCAP 146*    Microbiology: Results for orders placed or performed in visit on 04/16/17  Microscopic Examination     Status: None   Collection Time: 04/16/17  3:16 PM   URINE  Result Value Ref Range Status   WBC, UA 0-5 0 - 5 /hpf Final   RBC, UA None seen 0 - 2 /hpf Final   Epithelial Cells (non renal) 0-10 0 - 10 /hpf Final   Bacteria, UA None seen None seen/Few Final    Coagulation Studies: No results for input(s): LABPROT, INR in the last 72 hours.  Urinalysis:  Recent Labs  Lab 07/15/19 2207  COLORURINE YELLOW*  LABSPEC 1.016  PHURINE 6.0  GLUCOSEU NEGATIVE  HGBUR NEGATIVE  BILIRUBINUR NEGATIVE  KETONESUR NEGATIVE  PROTEINUR NEGATIVE  NITRITE NEGATIVE  LEUKOCYTESUR NEGATIVE    Lipid Panel:     Component Value Date/Time   CHOL 170 07/16/2019 0523   CHOL 194 05/11/2019 1423   TRIG 218 (H) 07/16/2019 0523   HDL 43 07/16/2019 0523   HDL 43 05/11/2019 1423   CHOLHDL 4.0 07/16/2019 0523   VLDL 44 (H) 07/16/2019 0523   LDLCALC 83 07/16/2019 0523   LDLCALC 105 (H) 05/11/2019 1423    HgbA1C:  Lab Results  Component Value Date   HGBA1C 6.1 (H) 07/16/2019    Urine Drug Screen:  No results found for:  LABOPIA, COCAINSCRNUR, LABBENZ, AMPHETMU, THCU, LABBARB  Alcohol Level: No results for input(s): ETH in the last 168 hours.  Other results: EKG: normal EKG, normal sinus rhythm, unchanged from previous tracings.  Imaging: Ct Head Wo Contrast  Result Date: 07/15/2019 CLINICAL DATA:  63 year old presenting with one-week history of headaches and dizziness associated with LEFT body numbness. EXAM: CT HEAD WITHOUT CONTRAST TECHNIQUE: Contiguous axial images were obtained from the base of the skull through the vertex without intravenous contrast. COMPARISON:  No prior head CT.  MRI brain 10/15/2011. FINDINGS: Brain: Ventricular system normal in size and appearance for age. No mass lesion. No midline shift. No acute hemorrhage or hematoma. No extra-axial fluid collections. No evidence of acute infarction. Note is made of an empty sella. Vascular: No hyperdense vessel.  No visible atherosclerosis. Skull: No skull fracture or other focal osseous abnormality involving the skull. Sinuses/Orbits: Visualized paranasal sinuses, bilateral mastoid air cells and bilateral middle ear  cavities well-aerated. Visualized orbits and globes normal in appearance. Other: None. IMPRESSION: No acute intracranial abnormality. Electronically Signed   By: Evangeline Dakin M.D.   On: 07/15/2019 22:31     Assessment/Plan:   63 y.o. female ho presents with  4 days of intermittent but progressively worsening paresthesias of her left arm and left leg.  She states that she has had burning sensation, cold sensation, and tingling numbness.  Slight weakness of LUE grip.   - CTH no acute abnormality - MRI brain and C spine  - anti platelet therapy/statin - will follow post imaging.   Leotis Pain   07/16/2019, 11:45 AM

## 2019-07-16 NOTE — Discharge Summary (Signed)
[facility] - [place] [facility]   PATIENT NAME: Katherine [name]    MR#:  [id number]  DATE OF BIRTH:  1956/04/10  DATE OF ADMISSION:  07/15/2019 ADMITTING PHYSICIAN: Oralia Manisavid Willis, MD  DATE OF DISCHARGE: 07/16/2019   PRIMARY CARE PHYSICIAN: Steele Sizerrissman, Mark A, MD    ADMISSION DIAGNOSIS:  Acute ischemic stroke (HCC) [I63.9]  DISCHARGE DIAGNOSIS:  Principal Problem:   Paresthesias Active Problems:   Hypothyroid   Hyperlipidemia   GERD (gastroesophageal reflux disease)   SECONDARY DIAGNOSIS:   Past Medical History:  Diagnosis Date  . Anemia   . Arthritis    joints  . Carpal tunnel syndrome    right arm  . Dyspnea   . GERD (gastroesophageal reflux disease)   . Hypothyroidism   . IBS (irritable bowel syndrome)   . Menopause   . Motion sickness   . Wears partial dentures    upper    HOSPITAL COURSE:   TIA/neuropathy-patient has left-sided paresthesia, CT scan of the head, MRI of brain and cervical spine, carotid Doppler study and echocardiogram are without any significant abnormalities. Patient was seen by neurologist and suggested to start on antiplatelet and statin therapy. Patient was seen by physical therapist and they suggested no further work-ups will follow-up as needed. Discharging home with advised to follow-up in neurology clinic.  Hypothyroidism-continued home dose levothyroxine. Hyperlipidemia-started on atorvastatin.  DISCHARGE CONDITIONS:   Stable.  CONSULTS OBTAINED:  Treatment Team:  Kym Groom[facility]osp, Neuro1, MD  DRUG ALLERGIES:   Allergies  Allergen Reactions  . Asa [Aspirin] Other (See Comments)    Makes blood thin    DISCHARGE MEDICATIONS:   Allergies as of 07/16/2019      Reactions   Asa [aspirin] Other (See Comments)   Makes blood thin      Medication List    TAKE these medications   albuterol 108 (90 Base) MCG/ACT inhaler Commonly known as: VENTOLIN HFA Inhale 2 puffs into the lungs every 6 (six)  hours as needed for wheezing or shortness of breath.   atorvastatin 40 MG tablet Commonly known as: LIPITOR Take 1 tablet (40 mg total) by mouth daily at 6 PM. Start taking on: July 17, 2019   clopidogrel 75 MG tablet Commonly known as: Plavix Take 1 tablet (75 mg total) by mouth daily.   diclofenac sodium 1 % Gel Commonly known as: VOLTAREN Apply 2 g topically as needed (three times daily as needed).   levothyroxine 50 MCG tablet Commonly known as: SYNTHROID TAKE 1 TABLET BY MOUTH EVERY DAY   silver sulfADIAZINE 1 % cream Commonly known as: SILVADENE Apply to affected area daily        DISCHARGE INSTRUCTIONS:    Follow with neurologist in 2 weeks.  If you experience worsening of your admission symptoms, develop shortness of breath, life threatening emergency, suicidal or homicidal thoughts you must seek medical attention immediately by calling 911 or calling your MD immediately  if symptoms less severe.  You Must read complete instructions/literature along with all the possible adverse reactions/side effects for all the Medicines you take and that have been prescribed to you. Take any new Medicines after you have completely understood and accept all the possible adverse reactions/side effects.   Please note  You were cared for by a hospitalist during your hospital stay. If you have any questions about your discharge medications or the care you received while you were in the hospital after you are discharged, you can call the unit and asked  to speak with the hospitalist on call if the hospitalist that took care of you is not available. Once you are discharged, your primary care physician will handle any further medical issues. Please note that NO REFILLS for any discharge medications will be authorized once you are discharged, as it is imperative that you return to your primary care physician (or establish a relationship with a primary care physician if you do not have one)  for your aftercare needs so that they can reassess your need for medications and monitor your lab values.    Today   CHIEF COMPLAINT:   Chief Complaint  Patient presents with  . Near Syncope  . Shortness of Breath    HISTORY OF PRESENT ILLNESS:  Katherine Rinksmphay Lamphear  is a 63 y.o. female with a known history of chief complaint as above.  Patient presents the ED with a complaint of 4 days of intermittent but progressively worsening paresthesias of her left arm.  She states that she has had burning sensation, cold sensation, and tingling numbness.  Here in the ED work-up is largely within normal limits initially, but with some concern for possibility of stroke.  Hospitalist called for admission  VITAL SIGNS:  Blood pressure 132/80, pulse 69, temperature 98.1 F (36.7 C), temperature source Oral, resp. rate 14, height 4\' 10"  (1.473 m), weight 51.8 kg, SpO2 98 %.  I/O:    Intake/Output Summary (Last 24 hours) at 07/16/2019 1734 Last data filed at 07/16/2019 1300 Gross per 24 hour  Intake 240 ml  Output -  Net 240 ml    PHYSICAL EXAMINATION:  GENERAL:  63 y.o.-year-old patient lying in the bed with no acute distress.  EYES: Pupils equal, round, reactive to light and accommodation. No scleral icterus. Extraocular muscles intact.  HEENT: Head atraumatic, normocephalic. Oropharynx and nasopharynx clear.  NECK:  Supple, no jugular venous distention. No thyroid enlargement, no tenderness.  LUNGS: Normal breath sounds bilaterally, no wheezing, rales,rhonchi or crepitation. No use of accessory muscles of respiration.  CARDIOVASCULAR: S1, S2 normal. No murmurs, rubs, or gallops.  ABDOMEN: Soft, non-tender, non-distended. Bowel sounds present. No organomegaly or mass.  EXTREMITIES: No pedal edema, cyanosis, or clubbing.  NEUROLOGIC: Cranial nerves II through XII are intact. Muscle strength 5/5 in all extremities. Sensation intact. Gait not checked.  PSYCHIATRIC: The patient is alert and oriented  x 3.  SKIN: No obvious rash, lesion, or ulcer.   DATA REVIEW:   CBC Recent Labs  Lab 07/16/19 0523  WBC 8.3  HGB 12.4  HCT 38.6  PLT 237    Chemistries  Recent Labs  Lab 07/15/19 1758 07/16/19 0523  NA 136  --   K 3.9  --   CL 102  --   CO2 27  --   GLUCOSE 143*  --   BUN 13  --   CREATININE 0.58 0.63  CALCIUM 9.1  --     Cardiac Enzymes No results for input(s): TROPONINI in the last 168 hours.  Microbiology Results  Results for orders placed or performed in visit on 04/16/17  Microscopic Examination     Status: None   Collection Time: 04/16/17  3:16 PM   URINE  Result Value Ref Range Status   WBC, UA 0-5 0 - 5 /hpf Final   RBC, UA None seen 0 - 2 /hpf Final   Epithelial Cells (non renal) 0-10 0 - 10 /hpf Final   Bacteria, UA None seen None seen/Few Final    RADIOLOGY:  Ct  Head Wo Contrast  Result Date: 07/15/2019 CLINICAL DATA:  63 year old presenting with one-week history of headaches and dizziness associated with LEFT body numbness. EXAM: CT HEAD WITHOUT CONTRAST TECHNIQUE: Contiguous axial images were obtained from the base of the skull through the vertex without intravenous contrast. COMPARISON:  No prior head CT.  MRI brain 10/15/2011. FINDINGS: Brain: Ventricular system normal in size and appearance for age. No mass lesion. No midline shift. No acute hemorrhage or hematoma. No extra-axial fluid collections. No evidence of acute infarction. Note is made of an empty sella. Vascular: No hyperdense vessel.  No visible atherosclerosis. Skull: No skull fracture or other focal osseous abnormality involving the skull. Sinuses/Orbits: Visualized paranasal sinuses, bilateral mastoid air cells and bilateral middle ear cavities well-aerated. Visualized orbits and globes normal in appearance. Other: None. IMPRESSION: No acute intracranial abnormality. Electronically Signed   By: Hulan Saashomas  Lawrence M.D.   On: 07/15/2019 22:31   Mr Brain Wo Contrast  Result Date:  07/16/2019 CLINICAL DATA:  Focal neuro deficit greater than 6 hours. Left body numbness EXAM: MRI HEAD WITHOUT CONTRAST TECHNIQUE: Multiplanar, multiecho pulse sequences of the brain and surrounding structures were obtained without intravenous contrast. COMPARISON:  CT head 07/16/2019, MRI 10/15/2011 FINDINGS: Brain: Negative for acute infarct. Cerebellar tonsils are low lying approximately 6 mm below the foramen magnum. This is unchanged from 2012. No impaction of the tonsils and no syrinx in the cord. Ventricle size normal. Several small white matter hyperintensities in the frontal white matter bilaterally. Otherwise the white matter is normal. Brainstem and cerebellum normal. Negative for hemorrhage or mass. Vascular: Normal vascular flow voids. Skull and upper cervical spine: Negative Sinuses/Orbits: Mild mucosal edema paranasal sinuses. Normal orbit Other: None IMPRESSION: No acute abnormality Mild cerebellar tonsil ectopia unchanged from 2012 Minimal changes in the frontal white matter appear chronic. Otherwise negative. Electronically Signed   By: Marlan Palauharles  Clark M.D.   On: 07/16/2019 16:51   Mr Cervical Spine Wo Contrast  Result Date: 07/16/2019 CLINICAL DATA:  Left body numbness. EXAM: MRI CERVICAL SPINE WITHOUT CONTRAST TECHNIQUE: Multiplanar, multisequence MR imaging of the cervical spine was performed. No intravenous contrast was administered. COMPARISON:  None. FINDINGS: Alignment: Mild anterolisthesis C2-3, C3-4, C4-5 appears physiologic Vertebrae: Normal bone marrow.  Negative for fracture or mass. Cord: Normal spinal cord.  No cord lesion or cord compression. Posterior Fossa, vertebral arteries, paraspinal tissues: Negative Disc levels: C2-3: Negative C3-4: Negative C4-5: Mild disc degeneration. Small left paracentral disc protrusion with slight flattening of the cord on the left C5-6: Disc degeneration and diffuse uncinate spurring. Cord flattening with mild spinal stenosis. Neural foramina  appear patent. C6-7: Negative C7-T1: Negative IMPRESSION: Normal appearing spinal cord Mild degenerative changes in the cervical spine C4-5 and C5-6. Electronically Signed   By: Marlan Palauharles  Clark M.D.   On: 07/16/2019 16:55   [place] Carotid Bilateral (at [facility] Only)  Result Date: 07/16/2019 CLINICAL DATA:  Paresthesias. Visual disturbance. Syncopal episode. EXAM: BILATERAL CAROTID DUPLEX ULTRASOUND TECHNIQUE: Wallace CullensGray scale imaging, color Doppler and duplex ultrasound were performed of bilateral carotid and vertebral arteries in the neck. COMPARISON:  None. FINDINGS: Criteria: Quantification of carotid stenosis is based on velocity parameters that correlate the residual internal carotid diameter with NASCET-based stenosis levels, using the diameter of the distal internal carotid lumen as the denominator for stenosis measurement. The following velocity measurements were obtained: RIGHT ICA: 81/41 cm/sec CCA: 99/27 cm/sec SYSTOLIC ICA/CCA RATIO:  0.8 ECA: 73 cm/sec LEFT ICA: 121/55 cm/sec CCA: 83/28 cm/sec SYSTOLIC ICA/CCA  RATIO:  1.5 ECA: 84 cm/sec RIGHT CAROTID ARTERY: There is no grayscale evidence significant intimal thickening or atherosclerotic plaque affecting the interrogated portions of the right carotid system. There are no elevated peak systolic velocities within the interrogated course of the right internal carotid artery to suggest a hemodynamically significant stenosis. RIGHT VERTEBRAL ARTERY:  Antegrade Flow LEFT CAROTID ARTERY: There is a minimal amount of intimal thickening within the left carotid bulb (image 46), not resulting in elevated peak systolic velocities within the interrogated course of the left internal carotid artery to suggest a hemodynamically significant stenosis. LEFT VERTEBRAL ARTERY:  Antegrade Flow IMPRESSION: 1. Minimal amount of left-sided intimal thickening, not resulting in a hemodynamically significant stenosis. 2. Normal sonographic evaluation of the right carotid system.  Electronically Signed   By: Sandi Mariscal M.D.   On: 07/16/2019 14:55    EKG:   Orders placed or performed during the hospital encounter of 07/15/19  . EKG 12-Lead  . EKG 12-Lead  . ED EKG  . ED EKG      Management plans discussed with the patient, family and they are in agreement.  CODE STATUS:     Code Status Orders  (From admission, onward)         Start     Ordered   07/16/19 0119  Full code  Continuous     07/16/19 0118        Code Status History    This patient has a current code status but no historical code status.   Advance Care Planning Activity      TOTAL TIME TAKING CARE OF THIS PATIENT: 35 minutes.    Vaughan Basta M.D on 07/16/2019 at 5:34 PM  Between 7am to 6pm - Pager - [phone]  After 6pm go to [email or web address] - password [email or web address] [facility]  Office  [phone]  CC: Primary care physician; Guadalupe Maple, MD   Note: This dictation was prepared with Dragon dictation along with smaller phrase technology. Any transcriptional errors that result from this process are unintentional.

## 2019-07-16 NOTE — Progress Notes (Signed)
*  PRELIMINARY RESULTS* Echocardiogram 2D Echocardiogram has been performed.  Katherine [name] 07/16/2019, 10:02 AM

## 2019-07-17 LAB — SARS CORONAVIRUS 2 (TAT 6-24 HRS): SARS Coronavirus 2: NEGATIVE

## 2019-07-30 MED ORDER — ONDANSETRON HCL 4 MG/2ML IJ SOLN
4.00 | INTRAMUSCULAR | Status: DC
Start: ? — End: 2019-07-30

## 2019-07-30 MED ORDER — GENERIC EXTERNAL MEDICATION
2.00 | Status: DC
Start: 2019-07-31 — End: 2019-07-30

## 2019-07-30 MED ORDER — LEVOTHYROXINE SODIUM 25 MCG PO TABS
50.00 | ORAL_TABLET | ORAL | Status: DC
Start: 2019-07-31 — End: 2019-07-30

## 2019-07-30 MED ORDER — CARBOXYMETHYLCELLULOSE SODIUM 0.25 % OP SOLN
1.00 | OPHTHALMIC | Status: DC
Start: ? — End: 2019-07-30

## 2019-07-30 MED ORDER — ENOXAPARIN SODIUM 40 MG/0.4ML ~~LOC~~ SOLN
40.00 | SUBCUTANEOUS | Status: DC
Start: 2019-07-31 — End: 2019-07-30

## 2019-07-30 MED ORDER — GENERIC EXTERNAL MEDICATION
Status: DC
Start: ? — End: 2019-07-30

## 2019-07-30 MED ORDER — ONDANSETRON 4 MG PO TBDP
4.00 | ORAL_TABLET | ORAL | Status: DC
Start: ? — End: 2019-07-30

## 2019-07-30 MED ORDER — ACETAMINOPHEN 325 MG PO TABS
650.00 | ORAL_TABLET | ORAL | Status: DC
Start: ? — End: 2019-07-30

## 2019-07-30 MED ORDER — ATORVASTATIN CALCIUM 20 MG PO TABS
40.00 | ORAL_TABLET | ORAL | Status: DC
Start: 2019-07-30 — End: 2019-07-30

## 2019-08-12 ENCOUNTER — Other Ambulatory Visit: Payer: Self-pay | Admitting: Family Medicine

## 2019-09-09 ENCOUNTER — Encounter: Payer: Self-pay | Admitting: Family Medicine

## 2019-09-09 ENCOUNTER — Ambulatory Visit (INDEPENDENT_AMBULATORY_CARE_PROVIDER_SITE_OTHER): Payer: BLUE CROSS/BLUE SHIELD | Admitting: Family Medicine

## 2019-09-09 ENCOUNTER — Other Ambulatory Visit: Payer: Self-pay

## 2019-09-09 VITALS — BP 107/73 | HR 70 | Temp 98.6°F | Ht <= 58 in | Wt 115.0 lb

## 2019-09-09 DIAGNOSIS — R300 Dysuria: Secondary | ICD-10-CM

## 2019-09-09 DIAGNOSIS — R202 Paresthesia of skin: Secondary | ICD-10-CM | POA: Diagnosis not present

## 2019-09-09 DIAGNOSIS — E782 Mixed hyperlipidemia: Secondary | ICD-10-CM

## 2019-09-09 LAB — CBC WITH DIFFERENTIAL/PLATELET
Hematocrit: 41.1 % (ref 34.0–46.6)
Hemoglobin: 13.8 g/dL (ref 11.1–15.9)
Lymphocytes Absolute: 3.2 10*3/uL — ABNORMAL HIGH (ref 0.7–3.1)
Lymphs: 39 %
MCH: 27.1 pg (ref 26.6–33.0)
MCHC: 33.6 g/dL (ref 31.5–35.7)
MCV: 81 fL (ref 79–97)
MID (Absolute): 0.9 10*3/uL (ref 0.1–1.6)
MID: 11 %
Neutrophils Absolute: 4.3 10*3/uL (ref 1.4–7.0)
Neutrophils: 50 %
Platelets: 293 10*3/uL (ref 150–450)
RBC: 5.09 x10E6/uL (ref 3.77–5.28)
RDW: 15.8 % — ABNORMAL HIGH (ref 11.7–15.4)
WBC: 8.4 10*3/uL (ref 3.4–10.8)

## 2019-09-09 LAB — UA/M W/RFLX CULTURE, ROUTINE
Bilirubin, UA: NEGATIVE
Glucose, UA: NEGATIVE
Ketones, UA: NEGATIVE
Leukocytes,UA: NEGATIVE
Nitrite, UA: NEGATIVE
Protein,UA: NEGATIVE
RBC, UA: NEGATIVE
Specific Gravity, UA: 1.015 (ref 1.005–1.030)
Urobilinogen, Ur: 0.2 mg/dL (ref 0.2–1.0)
pH, UA: 7 (ref 5.0–7.5)

## 2019-09-09 MED ORDER — LEVOTHYROXINE SODIUM 50 MCG PO TABS: 50.0000 ug | ORAL_TABLET | Freq: Every day | ORAL | 1 refills | Status: DC

## 2019-09-09 MED ORDER — ROSUVASTATIN CALCIUM 20 MG PO TABS: 20.0000 mg | ORAL_TABLET | Freq: Every day | ORAL | 1 refills | Status: DC

## 2019-09-09 NOTE — Progress Notes (Signed)
BP 107/73   Pulse 70   Temp 98.6 F (37 C) (Oral)   Ht 4\' 10"  (1.473 m)   Wt 115 lb (52.2 kg)   LMP  (LMP Unknown)   SpO2 99%   BMI 24.04 kg/m    Subjective:    Patient ID: Katherine [name], female    DOB: August 19, 1956, 63 y.o.   MRN: [id number]  HPI: Katherine [name] is a 63 y.o. female  Chief Complaint  Patient presents with  . Abdominal Pain    lower left sided x 3 days.  . Dysuria   3 days of mild crampy left lower abdominal discomfort and a few isolated episodes of dysuria. Sxs are not significant per patient, but had a kidney infection about a month ago and was told to come in right away if having any possible urinary sxs. Denies hematuria, flank pain, fever, chills, abdominal pain, N/V/D. Not trying anything other than drinking lots of water and cranberry juice daily.   Was placed on plavix during admission 06/2019 for possible TIA vs paresthesias for CVA prophylaxis. Plavix caused headaches, tried one time and stopped. Does not wish to take any longer. Is taking the crestor but wanting to know if this is something she can stop at some point. Denies side effects with that.   Relevant past medical, surgical, family and social history reviewed and updated as indicated. Interim medical history since our last visit reviewed. Allergies and medications reviewed and updated.  Review of Systems  Per HPI unless specifically indicated above     Objective:    BP 107/73   Pulse 70   Temp 98.6 F (37 C) (Oral)   Ht 4\' 10"  (1.473 m)   Wt 115 lb (52.2 kg)   LMP  (LMP Unknown)   SpO2 99%   BMI 24.04 kg/m   Wt Readings from Last 3 Encounters:  09/09/19 115 lb (52.2 kg)  07/16/19 114 lb 3.2 oz (51.8 kg)  06/04/19 115 lb (52.2 kg)    Physical Exam Vitals signs and nursing note reviewed.  Constitutional:      Appearance: Normal appearance. She is not ill-appearing.  HENT:     Head: Atraumatic.  Eyes:     Extraocular Movements: Extraocular movements intact.   Conjunctiva/sclera: Conjunctivae normal.  Neck:     Musculoskeletal: Normal range of motion and neck supple.  Cardiovascular:     Rate and Rhythm: Normal rate and regular rhythm.     Heart sounds: Normal heart sounds.  Pulmonary:     Effort: Pulmonary effort is normal.     Breath sounds: Normal breath sounds.  Abdominal:     General: Bowel sounds are normal. There is no distension.     Palpations: Abdomen is soft.     Tenderness: There is no abdominal tenderness. There is no right CVA tenderness, left CVA tenderness or guarding.  Musculoskeletal: Normal range of motion.        General: No swelling, tenderness or deformity.  Skin:    General: Skin is warm and dry.  Neurological:     Mental Status: She is alert and oriented to [name], place, and time.  Psychiatric:        Mood and Affect: Mood normal.        Thought Content: Thought content normal.        Judgment: Judgment normal.     Results for orders placed or performed in visit on 09/09/19  UA/M w/rflx Culture, Routine   Specimen: Urine  URINE  Result Value Ref Range   Specific Gravity, UA 1.015 1.005 - 1.030   pH, UA 7.0 5.0 - 7.5   Color, UA Yellow Yellow   Appearance Ur Clear Clear   Leukocytes,UA Negative Negative   Protein,UA Negative Negative/Trace   Glucose, UA Negative Negative   Ketones, UA Negative Negative   RBC, UA Negative Negative   Bilirubin, UA Negative Negative   Urobilinogen, Ur 0.2 0.2 - 1.0 mg/dL   Nitrite, UA Negative Negative  CBC With Differential/Platelet  Result Value Ref Range   WBC 8.4 3.4 - 10.8 x10E3/uL   RBC 5.09 3.77 - 5.28 x10E6/uL   Hemoglobin 13.8 11.1 - 15.9 g/dL   Hematocrit 49.6 75.9 - 46.6 %   MCV 81 79 - 97 fL   MCH 27.1 26.6 - 33.0 pg   MCHC 33.6 31.5 - 35.7 g/dL   RDW 16.3 (H) 84.6 - 65.9 %   Platelets 293 150 - 450 x10E3/uL   Neutrophils 50 Not Estab. %   Lymphs 39 Not Estab. %   MID 11 Not Estab. %   Neutrophils Absolute 4.3 1.4 - 7.0 x10E3/uL   Lymphocytes  Absolute 3.2 (H) 0.7 - 3.1 x10E3/uL   MID (Absolute) 0.9 0.1 - 1.6 X10E3/uL      Assessment & Plan:   Problem List Items Addressed This Visit      Other   Hyperlipidemia    Seems to be tolerating crestor well. Discussed that this would likely be long term for her for risk reduction purposes in addition to continued healthy lifestyle       Relevant Medications   rosuvastatin (CRESTOR) 20 MG tablet   Paresthesias    CVA ruled out with imaging per record review of recent admission 06/2019, but unclear if TIA vs paresthesias. Was placed on plavix for prophylaxis but pt did not tolerate medication. She does not wish to resume the medicine. Risks reviewed       Other Visit Diagnoses    Dysuria    -  Primary   U/A benign today, continue pushing fluids and f/u for repeat U/A if sxs not resolving or worsening   Relevant Orders   UA/M w/rflx Culture, Routine (Completed)   CBC With Differential/Platelet (Completed)   UA/M w/rflx Culture, Routine       Follow up plan: Return for as scheduled.

## 2019-09-15 NOTE — Assessment & Plan Note (Signed)
Seems to be tolerating crestor well. Discussed that this would likely be long term for her for risk reduction purposes in addition to continued healthy lifestyle

## 2019-09-15 NOTE — Assessment & Plan Note (Signed)
CVA ruled out with imaging per record review of recent admission 06/2019, but unclear if TIA vs paresthesias. Was placed on plavix for prophylaxis but pt did not tolerate medication. She does not wish to resume the medicine. Risks reviewed

## 2019-09-18 ENCOUNTER — Other Ambulatory Visit: Payer: Self-pay

## 2019-09-18 ENCOUNTER — Encounter: Payer: Self-pay | Admitting: Family Medicine

## 2019-09-18 ENCOUNTER — Ambulatory Visit (INDEPENDENT_AMBULATORY_CARE_PROVIDER_SITE_OTHER): Payer: BLUE CROSS/BLUE SHIELD | Admitting: Family Medicine

## 2019-09-18 VITALS — BP 126/84 | HR 84 | Temp 98.6°F

## 2019-09-18 DIAGNOSIS — R310 Gross hematuria: Secondary | ICD-10-CM

## 2019-09-18 DIAGNOSIS — R300 Dysuria: Secondary | ICD-10-CM

## 2019-09-18 LAB — MICROSCOPIC EXAMINATION: Bacteria, UA: NONE SEEN

## 2019-09-18 LAB — UA/M W/RFLX CULTURE, ROUTINE
Bilirubin, UA: NEGATIVE
Glucose, UA: NEGATIVE
Ketones, UA: NEGATIVE
Leukocytes,UA: NEGATIVE
Nitrite, UA: NEGATIVE
Protein,UA: NEGATIVE
Specific Gravity, UA: 1.015 (ref 1.005–1.030)
Urobilinogen, Ur: 0.2 mg/dL (ref 0.2–1.0)
pH, UA: 6 (ref 5.0–7.5)

## 2019-09-18 MED ORDER — SULFAMETHOXAZOLE-TRIMETHOPRIM 800-160 MG PO TABS: 1.0000 | ORAL_TABLET | Freq: Two times a day (BID) | ORAL | 0 refills | Status: DC

## 2019-09-18 NOTE — Progress Notes (Signed)
BP 126/84   Pulse 84   Temp 98.6 F (37 C)   LMP  (LMP Unknown)   SpO2 99%    Subjective:    Patient ID: Katherine [name], female    DOB: 09-Feb-1956, 63 y.o.   MRN: [id number]  HPI: Katherine [name] is a 63 y.o. female  Chief Complaint  Patient presents with  . Abdominal Pain    lower left and some nausea   Dysuria, blood with wiping, lower abdominal pain, mild nausea the past two days. Has been using AZO with cranberry without much relief. Had similar sxs about a week ago, normal U/A and exam at that time and milder sxs. Last month was admitted for tx of pyelonephritis, with normal renal ultrasound performed at that time per hospital notes reviewed. No flank pain, fevers, vomiting, diarrhea, constipation, known hx of kidney stones, weight loss, fatigue, night sweats.   Relevant past medical, surgical, family and social history reviewed and updated as indicated. Interim medical history since our last visit reviewed. Allergies and medications reviewed and updated.  Review of Systems  Per HPI unless specifically indicated above     Objective:    BP 126/84   Pulse 84   Temp 98.6 F (37 C)   LMP  (LMP Unknown)   SpO2 99%   Wt Readings from Last 3 Encounters:  09/09/19 115 lb (52.2 kg)  07/16/19 114 lb 3.2 oz (51.8 kg)  06/04/19 115 lb (52.2 kg)    Physical Exam Vitals signs and nursing note reviewed.  Constitutional:      Appearance: Normal appearance. She is not ill-appearing.  HENT:     Head: Atraumatic.  Eyes:     Extraocular Movements: Extraocular movements intact.     Conjunctiva/sclera: Conjunctivae normal.  Neck:     Musculoskeletal: Normal range of motion and neck supple.  Cardiovascular:     Rate and Rhythm: Normal rate and regular rhythm.     Heart sounds: Normal heart sounds.  Pulmonary:     Effort: Pulmonary effort is normal.     Breath sounds: Normal breath sounds.  Abdominal:     General: Bowel sounds are normal.     Palpations: Abdomen is  soft. There is no mass.     Tenderness: There is abdominal tenderness (mild suprapubic ttp). There is no right CVA tenderness, left CVA tenderness, guarding or rebound.  Genitourinary:    Comments: GU exam declined Musculoskeletal: Normal range of motion.  Skin:    General: Skin is warm and dry.  Neurological:     Mental Status: She is alert and oriented to [name], place, and time.  Psychiatric:        Mood and Affect: Mood normal.        Thought Content: Thought content normal.        Judgment: Judgment normal.     Results for orders placed or performed in visit on 09/18/19  Microscopic Examination   URINE  Result Value Ref Range   WBC, UA 0-5 0 - 5 /hpf   RBC 3-10 (A) 0 - 2 /hpf   Epithelial Cells (non renal) 0-10 0 - 10 /hpf   Bacteria, UA None seen None seen/Few  UA/M w/rflx Culture, Routine   Specimen: Urine   URINE  Result Value Ref Range   Specific Gravity, UA 1.015 1.005 - 1.030   pH, UA 6.0 5.0 - 7.5   Color, UA Yellow Yellow   Appearance Ur Clear Clear   Leukocytes,UA Negative Negative  Protein,UA Negative Negative/Trace   Glucose, UA Negative Negative   Ketones, UA Negative Negative   RBC, UA Trace (A) Negative   Bilirubin, UA Negative Negative   Urobilinogen, Ur 0.2 0.2 - 1.0 mg/dL   Nitrite, UA Negative Negative   Microscopic Examination See below:       Assessment & Plan:   Problem List Items Addressed This Visit    None    Visit Diagnoses    Gross hematuria    -  Primary   U/A again neg for infection, but shows hematuria. Recent renal u/s neg for stones. Referral placed to Urology for further evaluation.   Relevant Orders   Ambulatory referral to Urology   Dysuria       Relevant Orders   UA/M w/rflx Culture, Routine (Completed)   Ambulatory referral to Urology       Follow up plan: Return if symptoms worsen or fail to improve.

## 2019-11-11 ENCOUNTER — Ambulatory Visit: Payer: BLUE CROSS/BLUE SHIELD | Admitting: Nurse Practitioner

## 2020-02-19 ENCOUNTER — Ambulatory Visit: Payer: BLUE CROSS/BLUE SHIELD | Attending: Internal Medicine

## 2020-02-19 DIAGNOSIS — Z23 Encounter for immunization: Secondary | ICD-10-CM

## 2020-02-19 NOTE — Progress Notes (Signed)
   Covid-19 Vaccination Clinic  Name:  Katherine [name]    MRN: [id number] DOB: 10/01/1956  02/19/2020  [name] was observed post Covid-19 immunization for 15 minutes without incident. She was provided with Vaccine Information Sheet and instruction to access the V-Safe system.   [name] was instructed to call 911 with any severe reactions post vaccine: Marland Kitchen Difficulty breathing  . Swelling of face and throat  . A fast heartbeat  . A bad rash all over body  . Dizziness and weakness   Immunizations Administered    Name Date Dose VIS Date Route   Pfizer COVID-19 Vaccine 02/19/2020  2:06 PM 0.3 mL 11/06/2019 Intramuscular   Manufacturer: [employer], [employer]   Lot: [id number]   NDC: [address]-3

## 2020-03-16 ENCOUNTER — Ambulatory Visit: Payer: BLUE CROSS/BLUE SHIELD | Attending: Internal Medicine

## 2020-03-16 DIAGNOSIS — Z23 Encounter for immunization: Secondary | ICD-10-CM

## 2020-03-16 NOTE — Progress Notes (Signed)
   Covid-19 Vaccination Clinic  Name:  Denisa Enterline    MRN: [id number] DOB: 03/28/1956  03/16/2020  Ms. Guinyard was observed post Covid-19 immunization for 15 minutes without incident. She was provided with Vaccine Information Sheet and instruction to access the V-Safe system.   Ms. Keizer was instructed to call 911 with any severe reactions post vaccine: Marland Kitchen Difficulty breathing  . Swelling of face and throat  . A fast heartbeat  . A bad rash all over body  . Dizziness and weakness   Immunizations Administered    Name Date Dose VIS Date Route   Pfizer COVID-19 Vaccine 03/16/2020  8:49 AM 0.3 mL 01/20/2019 Intramuscular   Manufacturer: [employer], [employer]   Lot: [id number]   NDC: [address]-6

## 2020-03-25 ENCOUNTER — Other Ambulatory Visit: Payer: Self-pay | Admitting: Family Medicine

## 2020-04-06 ENCOUNTER — Other Ambulatory Visit: Payer: Self-pay | Admitting: Family Medicine

## 2020-04-06 NOTE — Telephone Encounter (Signed)
Requested Prescriptions  Pending Prescriptions Disp Refills  . rosuvastatin (CRESTOR) 20 MG tablet [Pharmacy Med Name: ROSUVASTATIN CALCIUM 20 MG TAB] 90 tablet 0    Sig: TAKE 1 TABLET BY MOUTH EVERY DAY     Cardiovascular:  Antilipid - Statins Failed - 04/06/2020  1:17 AM      Failed - Triglycerides in normal range and within 360 days    Triglycerides  Date Value Ref Range Status  07/16/2019 218 (H) <150 mg/dL Final         Passed - Total Cholesterol in normal range and within 360 days    Cholesterol, Total  Date Value Ref Range Status  05/11/2019 194 100 - 199 mg/dL Final   Cholesterol  Date Value Ref Range Status  07/16/2019 170 0 - 200 mg/dL Final         Passed - LDL in normal range and within 360 days    LDL Calculated  Date Value Ref Range Status  05/11/2019 105 (H) 0 - 99 mg/dL Final   LDL Cholesterol  Date Value Ref Range Status  07/16/2019 83 0 - 99 mg/dL Final    Comment:           Total Cholesterol/HDL:CHD Risk Coronary Heart Disease Risk Table                     Men   Women  1/2 Average Risk   3.4   3.3  Average Risk       5.0   4.4  2 X Average Risk   9.6   7.1  3 X Average Risk  23.4   11.0        Use the calculated Patient Ratio above and the CHD Risk Table to determine the patient's CHD Risk.        ATP III CLASSIFICATION (LDL):  <100     mg/dL   Optimal  751-700  mg/dL   Near or Above                    Optimal  130-159  mg/dL   Borderline  174-944  mg/dL   High  >967     mg/dL   Very High Performed at [facility], [address] [place], [place] [address]          Passed - HDL in normal range and within 360 days    HDL  Date Value Ref Range Status  07/16/2019 43 >40 mg/dL Final  84/66/5993 43 >57 mg/dL Final         Passed - Patient is not pregnant      Passed - Valid encounter within last 12 months    Recent Outpatient Visits          6 months ago Gross hematuria   [facility] Particia Nearing,  [place]   7 months ago Dysuria   [facility] Roosvelt Maser [place], [place]   11 months ago Encounter for annual physical exam   [facility] [place], Corrie Dandy T, NP   1 year ago Hypothyroidism, unspecified type   [facility] [place], Redge Gainer, MD   2 years ago Shortness of breath   [facility] Roosvelt Maser [place], [place]

## 2020-06-17 ENCOUNTER — Other Ambulatory Visit: Payer: Self-pay | Admitting: Family Medicine

## 2020-06-30 ENCOUNTER — Other Ambulatory Visit: Payer: Self-pay | Admitting: Family Medicine

## 2020-06-30 NOTE — Telephone Encounter (Signed)
Requested  medications are  due for refill today yes  Requested medications are on the active medication list yes  Last refill 5/13  Last visit June 2020  Future visit scheduled NO  Notes to clinic Failed protocol due to no valid visit within 6 months. (June 2020 office note states lab work ordered, no lab results found for that time.)

## 2020-08-15 ENCOUNTER — Ambulatory Visit: Payer: BLUE CROSS/BLUE SHIELD | Admitting: Nurse Practitioner

## 2020-08-23 ENCOUNTER — Other Ambulatory Visit: Payer: Self-pay

## 2020-08-23 ENCOUNTER — Ambulatory Visit (INDEPENDENT_AMBULATORY_CARE_PROVIDER_SITE_OTHER): Payer: BLUE CROSS/BLUE SHIELD | Admitting: Nurse Practitioner

## 2020-08-23 ENCOUNTER — Encounter: Payer: Self-pay | Admitting: Nurse Practitioner

## 2020-08-23 VITALS — BP 143/80 | HR 85 | Temp 98.8°F | Ht <= 58 in | Wt 121.0 lb

## 2020-08-23 DIAGNOSIS — M549 Dorsalgia, unspecified: Secondary | ICD-10-CM

## 2020-08-23 DIAGNOSIS — M25511 Pain in right shoulder: Secondary | ICD-10-CM | POA: Diagnosis not present

## 2020-08-23 DIAGNOSIS — G8929 Other chronic pain: Secondary | ICD-10-CM

## 2020-08-23 DIAGNOSIS — E782 Mixed hyperlipidemia: Secondary | ICD-10-CM

## 2020-08-23 DIAGNOSIS — E039 Hypothyroidism, unspecified: Secondary | ICD-10-CM

## 2020-08-23 DIAGNOSIS — M25512 Pain in left shoulder: Secondary | ICD-10-CM

## 2020-08-23 MED ORDER — PREDNISONE 10 MG PO TABS: 30.0000 mg | ORAL_TABLET | Freq: Every day | ORAL | 0 refills | Status: DC

## 2020-08-23 NOTE — Assessment & Plan Note (Signed)
Chronic, previously stable on levothyroxine 50 mcg daily.  Will check TSH today and re-order at previously prescribed dosing as long as TSH normal.  Follow up in 6 months.

## 2020-08-23 NOTE — Progress Notes (Signed)
Established patient visit   Patient: Katherine [name]   DOB: June 15, 1956   64 y.o. Female  MRN: [id number] Visit Date: 08/23/2020  Today's healthcare provider: Valentino Nose, NP   Chief Complaint  Patient presents with  . Shoulder Pain  . Arm Pain   Subjective    Shoulder Pain  The pain is present in the left arm, left elbow, left wrist, left hand, left fingers, right hand, right wrist, right shoulder, right arm, right elbow and right fingers. This is a chronic problem. The current episode started more than 1 year ago. There has been no history of extremity trauma. The problem occurs constantly. The problem has been gradually worsening. The quality of the pain is described as aching and sharp. The pain is severe. Associated symptoms include an inability to bear weight, joint swelling, a limited range of motion, numbness, stiffness and tingling. Pertinent negatives include no fever, itching or joint locking. The symptoms are aggravated by activity and lying down. She has tried NSAIDS for the symptoms. The treatment provided no relief.    Previously got joint injection at this clinic about 3 years ago with good relief.  The pain returned about 3 months later.   HYPOTHYROIDISM Thyroid control status:controlled Satisfied with current treatment? yes Medication side effects: no Medication compliance: excellent compliance Etiology of hypothyroidism: unknown Recent dose adjustment:no Fatigue: no Cold intolerance: no Heat intolerance: no Weight gain: no Weight loss: no Constipation: no Diarrhea/loose stools: no Palpitations: no Lower extremity edema: no Anxiety/depressed mood: no    Patient Active Problem List   Diagnosis Date Noted  . Chronic pain of both shoulders 08/23/2020  . Paresthesias 07/16/2019  . GERD (gastroesophageal reflux disease) 07/16/2019  . Hand pain, left 05/11/2019  . Rectal bleeding   . Asthma 07/09/2017  . Low back pain 05/23/2017  . Neuropathy  04/16/2017  . Depression 08/30/2015  . Menopause 07/21/2015  . Carpal tunnel syndrome 07/21/2015  . IBS (irritable bowel syndrome) 07/21/2015  . Hypothyroid 07/21/2015  . Hyperlipidemia 05/17/2014   Social History   Tobacco Use  . Smoking status: Never Smoker  . Smokeless tobacco: Never Used  Vaping Use  . Vaping Use: Never used  Substance Use Topics  . Alcohol use: No    Alcohol/week: 0.0 standard drinks    Comment: rarely  . Drug use: No   Allergies  Allergen Reactions  . Asa [Aspirin] Other (See Comments)    Makes blood thin    Medications: Outpatient Medications Prior to Visit  Medication Sig  . levothyroxine (SYNTHROID) 50 MCG tablet TAKE 1 TABLET BY MOUTH EVERY DAY  . rosuvastatin (CRESTOR) 20 MG tablet TAKE 1 TABLET BY MOUTH EVERY DAY  . albuterol (VENTOLIN HFA) 108 (90 Base) MCG/ACT inhaler Inhale 2 puffs into the lungs every 6 (six) hours as needed for wheezing or shortness of breath. (Patient not taking: Reported on 09/09/2019)  . [DISCONTINUED] diclofenac sodium (VOLTAREN) 1 % GEL Apply 2 g topically as needed (three times daily as needed).  . [DISCONTINUED] sulfamethoxazole-trimethoprim (BACTRIM DS) 800-160 MG tablet Take 1 tablet by mouth 2 (two) times daily.   No facility-administered medications prior to visit.    Review of Systems  Constitutional: Negative.  Negative for activity change, appetite change, fatigue and fever.  Respiratory: Negative.  Negative for chest tightness, shortness of breath and wheezing.   Cardiovascular: Negative.  Negative for chest pain.  Gastrointestinal: Negative.  Negative for abdominal pain, nausea and vomiting.  Musculoskeletal: Positive for arthralgias (  upper back), back pain, myalgias (bilateral arms) and stiffness.  Skin: Negative for itching.  Neurological: Positive for tingling and numbness.    Objective    BP (!) 143/80 (BP Location: Left Arm, Patient Position: Sitting, Cuff Size: Normal)   Pulse 85   Temp 98.8  F (37.1 C) (Oral)   Ht 4\' 9"  (1.448 m)   Wt 121 lb (54.9 kg)   LMP  (LMP Unknown)   SpO2 99%   BMI 26.18 kg/m   Physical Exam Vitals and nursing note reviewed.  Constitutional:      General: She is not in acute distress.    Appearance: Normal appearance. She is obese. She is not toxic-appearing.  HENT:     Head: Normocephalic and atraumatic.     Right Ear: External ear normal.     Left Ear: External ear normal.  Eyes:     General: No scleral icterus.    Extraocular Movements: Extraocular movements intact.  Neck:     Vascular: No carotid bruit.  Cardiovascular:     Rate and Rhythm: Normal rate and regular rhythm.     Heart sounds: Normal heart sounds. No murmur heard.   Pulmonary:     Effort: Pulmonary effort is normal. No respiratory distress.     Breath sounds: Normal breath sounds. No wheezing, rhonchi or rales.  Musculoskeletal:        General: No swelling or tenderness.     Right shoulder: No swelling, bony tenderness or crepitus. Decreased range of motion. Normal strength. Normal pulse.     Left shoulder: No swelling, bony tenderness or crepitus. Decreased range of motion. Normal strength. Normal pulse.     Cervical back: Normal range of motion. No rigidity.     Right lower leg: No edema.     Left lower leg: No edema.  Skin:    General: Skin is warm and dry.     Coloration: Skin is not jaundiced or pale.     Findings: No erythema.  Neurological:     General: No focal deficit present.     Mental Status: She is alert and oriented to person, place, and time.     Motor: No weakness.  Psychiatric:        Mood and Affect: Mood normal.        Behavior: Behavior normal.        Thought Content: Thought content normal.        Judgment: Judgment normal.       Assessment & Plan     1. Chronic pain of both shoulders Chronic, ongoing.  Patient requesting x-ray today and referral to specialist for ongoing treatment.  Discussed non-pharmacological therapies at length.   Will start on burst of steroids to help decrease inflammation to area.  Encouraged Physical Therapy, patient declined for now.  Return to clinic in 2 weeks to ensure improvement.  Note given for work. - DG Shoulder Left; Future - DG Shoulder Right; Future  2. Upper back pain Related to should pain. See should pain Assessment and plan. - DG Cervical Spine Complete; Future  3. Hypothyroidism, unspecified type Chronic, previously stable on levothyroxine 50 mcg daily.  Will check TSH today and re-order at previously prescribed dosing as long as TSH normal.  Follow up in 6 months. - TSH - Comprehensive metabolic panel  4. Mixed hyperlipidemia Chronic, previously stable on rosuvastatin 20 mg daily.  Will check lipid panel today with CMP, patient is not fasting.  Continue on this medication  regimen for now.  Return to clinic in 6 months for recheck. - Lipid Panel w/o Chol/HDL Ratio - Comprehensive metabolic panel   Return in about 2 weeks (around 09/06/2020) for arm pain.      I have reviewed this encounter including the documentation in this note. I am certifying that I agree with the content of this note as primary care provider.  30+ minutes spent with the patient today.  Valentino Nose, NP  [facility] [phone] (phone) [phone] (fax)  [facility]

## 2020-08-23 NOTE — Patient Instructions (Addendum)
Be sure to get the imaging of your shoulders at [facility] tomorrow.   The [facility] is a walk-in orthopedic clinic that you can walk in to without a referral but we will also send in a referral for you.   Muscle Strain A muscle strain is an injury that happens when a muscle is stretched longer than normal. This can happen during a fall, sports, or lifting. This can tear some muscle fibers. Usually, recovery from muscle strain takes 1-2 weeks. Complete healing normally takes 5-6 weeks. This condition is first treated with PRICE therapy. This involves:  Protecting your muscle from being injured again.  Resting your injured muscle.  Icing your injured muscle.  Applying pressure (compression) to your injured muscle. This may be done with a splint or elastic bandage.  Raising (elevating) your injured muscle. Your doctor may also recommend medicine for pain. Follow these instructions at home: If you have a splint:  Wear the splint as told by your doctor. Take it off only as told by your doctor.  Loosen the splint if your fingers or toes tingle, get numb, or turn cold and blue.  Keep the splint clean.  If the splint is not waterproof: ? Do not let it get wet. ? Cover it with a watertight covering when you take a bath or a shower. Managing pain, stiffness, and swelling   If directed, put ice on your injured area. ? If you have a removable splint, take it off as told by your doctor. ? Put ice in a plastic bag. ? Place a towel between your skin and the bag. ? Leave the ice on for 20 minutes, 2-3 times a day.  Move your fingers or toes often. This helps to avoid stiffness and lessen swelling.  Raise your injured area above the level of your heart while you are sitting or lying down.  Wear an elastic bandage as told by your doctor. Make sure it is not too tight. General instructions  Take over-the-counter and prescription medicines only as told by your  doctor.  Limit your activity. Rest your injured muscle as told by your doctor. Your doctor may say that gentle movements are okay.  If physical therapy was prescribed, do exercises as told by your doctor.  Do not put pressure on any part of the splint until it is fully hardened. This may take many hours.  Do not use any products that contain nicotine or tobacco, such as cigarettes and e-cigarettes. These can delay bone healing. If you need help quitting, ask your doctor.  Warm up before you exercise. This helps to prevent more muscle strains.  Ask your doctor when it is safe to drive if you have a splint.  Keep all follow-up visits as told by your doctor. This is important. Contact a doctor if:  You have more pain or swelling in your injured area. Get help right away if:  You have any of these problems in your injured area: ? You have numbness. ? You have tingling. ? You lose a lot of strength. Summary  A muscle strain is an injury that happens when a muscle is stretched longer than normal.  This condition is first treated with PRICE therapy. This includes protecting, resting, icing, adding pressure, and raising your injury.  Limit your activity. Rest your injured muscle as told by your doctor. Your doctor may say that gentle movements are okay.  Warm up before you exercise. This helps to prevent more muscle  strains. This information is not intended to replace advice given to you by your health care provider. Make sure you discuss any questions you have with your health care provider. Document Revised: 01/08/2019 Document Reviewed: 12/19/2016 Elsevier Patient Education  [place].

## 2020-08-23 NOTE — Assessment & Plan Note (Signed)
Chronic, previously stable on rosuvastatin 20 mg daily.  Will check lipid panel today with CMP, patient is not fasting.  Continue on this medication regimen for now.  Return to clinic in 6 months for recheck.

## 2020-08-23 NOTE — Assessment & Plan Note (Signed)
Chronic, ongoing.  Patient requesting x-ray today and referral to specialist for ongoing treatment.  Discussed non-pharmacological therapies at length.  Will start on burst of steroids to help decrease inflammation to area.  Encouraged Physical Therapy, patient declined for now.  Return to clinic in 2 weeks to ensure improvement.  Note given for work.

## 2020-08-24 ENCOUNTER — Telehealth: Payer: Self-pay

## 2020-08-24 ENCOUNTER — Other Ambulatory Visit: Payer: Self-pay

## 2020-08-24 ENCOUNTER — Ambulatory Visit
Admission: RE | Admit: 2020-08-24 | Discharge: 2020-08-24 | Disposition: A | Discharge status: Home / Self Care | Payer: BLUE CROSS/BLUE SHIELD | Source: Ambulatory Visit | Attending: Nurse Practitioner | Admitting: Nurse Practitioner

## 2020-08-24 ENCOUNTER — Ambulatory Visit
Admission: RE | Admit: 2020-08-24 | Discharge: 2020-08-24 | Disposition: A | Discharge status: Home / Self Care | Payer: BLUE CROSS/BLUE SHIELD | Attending: Nurse Practitioner | Admitting: Nurse Practitioner

## 2020-08-24 DIAGNOSIS — M25512 Pain in left shoulder: Secondary | ICD-10-CM | POA: Insufficient documentation

## 2020-08-24 DIAGNOSIS — M25511 Pain in right shoulder: Secondary | ICD-10-CM | POA: Diagnosis present

## 2020-08-24 DIAGNOSIS — G8929 Other chronic pain: Secondary | ICD-10-CM

## 2020-08-24 DIAGNOSIS — M549 Dorsalgia, unspecified: Secondary | ICD-10-CM | POA: Insufficient documentation

## 2020-08-24 LAB — LIPID PANEL W/O CHOL/HDL RATIO
Cholesterol, Total: 109 mg/dL (ref 100–199)
HDL: 49 mg/dL (ref >39)
LDL Chol Calc (NIH): 33 mg/dL (ref 0–99)
Triglycerides: 161 mg/dL — ABNORMAL HIGH (ref 0–149)
VLDL Cholesterol Cal: 27 mg/dL (ref 5–40)

## 2020-08-24 LAB — COMPREHENSIVE METABOLIC PANEL
ALT: 23 IU/L (ref 0–32)
AST: 27 IU/L (ref 0–40)
Albumin/Globulin Ratio: 1.5 (ref 1.2–2.2)
Albumin: 4.5 g/dL (ref 3.8–4.8)
Alkaline Phosphatase: 93 IU/L (ref 44–121)
BUN/Creatinine Ratio: 14 (ref 12–28)
BUN: 13 mg/dL (ref 8–27)
Bilirubin Total: <0.2 mg/dL (ref 0.0–1.2)
CO2: 20 mmol/L (ref 20–29)
Calcium: 8.7 mg/dL (ref 8.7–10.3)
Chloride: 107 mmol/L — ABNORMAL HIGH (ref 96–106)
Creatinine, Ser: 0.94 mg/dL (ref 0.57–1.00)
GFR calc Af Amer: 76 mL/min/{1.73_m2} (ref >59)
GFR calc non Af Amer: 66 mL/min/{1.73_m2} (ref >59)
Globulin, Total: 3.1 g/dL (ref 1.5–4.5)
Glucose: 108 mg/dL — ABNORMAL HIGH (ref 65–99)
Potassium: 4 mmol/L (ref 3.5–5.2)
Sodium: 140 mmol/L (ref 134–144)
Total Protein: 7.6 g/dL (ref 6.0–8.5)

## 2020-08-24 LAB — TSH: TSH: 3.65 u[IU]/mL (ref 0.450–4.500)

## 2020-08-24 MED ORDER — LEVOTHYROXINE SODIUM 50 MCG PO TABS: 50.0000 ug | ORAL_TABLET | Freq: Every day | ORAL | 1 refills | Status: DC

## 2020-08-24 NOTE — Telephone Encounter (Signed)
-----   Message from Valentino Nose, NP sent at 08/24/2020  1:21 PM EDT ----- Please let patient know that labs from yesterday are back. - Thyroid levels are normal.  We will continue her levothyroxine at the current dosing.-Her cholesterol levels looked really good, let's continue the rosuvastatin at the current dose. -Kidney function, liver function, and electrolytes looked nice and normal.

## 2020-08-24 NOTE — Telephone Encounter (Signed)
[facility] refill request on levothyroxine 50 mcg tab

## 2020-08-24 NOTE — Telephone Encounter (Signed)
Patient advised as below. Patient verbalizes understanding and is in agreement with treatment plan.  

## 2020-09-02 ENCOUNTER — Telehealth: Payer: Self-pay

## 2020-09-02 NOTE — Telephone Encounter (Signed)
-----   Message from Valentino Nose, NP sent at 08/26/2020  4:33 PM EDT ----- Please let patient know that x-ray results are back.Left shoulder x-ray did not show any acute findings.  Right shoulder x-ray showed osteoarthritis, a bone spur, and some tendinopathy.  The prednisone should help with this tendinopathy.  I would recommend ice or heat and physical therapy for the osteoarthritis.  I have also sent a referral in for an orthopedic surgeon to evaluate you.  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  Again stretches, ice, and physical therapy may help with this over time.  There are no fractures in any of the imaging which is a good thing.

## 2020-09-02 NOTE — Telephone Encounter (Signed)
Patient advised as below. Patient verbalizes understanding and is in agreement with treatment plan.  

## 2020-09-06 ENCOUNTER — Telehealth: Payer: Self-pay

## 2020-09-06 NOTE — Telephone Encounter (Signed)
Copied from CRM [id number]. Topic: General - Inquiry >> Aug 30, 2020  3:02 PM Leary Roca wrote: Reason for CRM: Pts husband called in stating she is needing another xray done . He is wanting a call back from office to discuss.please advise

## 2020-09-06 NOTE — Telephone Encounter (Signed)
See last phone message in chart. Patient was spoken to 09/02/20 regarding this.

## 2020-11-18 HISTORY — PX: ROTATOR CUFF REPAIR: SHX139

## 2021-02-16 ENCOUNTER — Other Ambulatory Visit: Payer: Self-pay

## 2021-02-16 MED ORDER — LEVOTHYROXINE SODIUM 50 MCG PO TABS: 50.0000 ug | ORAL_TABLET | Freq: Every day | ORAL | 1 refills | Status: DC

## 2021-02-16 NOTE — Telephone Encounter (Signed)
Rx refill from phamacy Levothyroxine 50 mcg QAM   LOV 08/23/20  Last TSH lab 08/23/20   NO upcoming appointments

## 2021-08-14 ENCOUNTER — Other Ambulatory Visit: Payer: Self-pay | Admitting: Internal Medicine

## 2021-08-14 NOTE — Telephone Encounter (Signed)
Patient must keep upcoming appointment for further refills. 

## 2021-08-16 ENCOUNTER — Ambulatory Visit: Payer: BLUE CROSS/BLUE SHIELD | Admitting: Internal Medicine

## 2021-08-16 ENCOUNTER — Other Ambulatory Visit: Payer: Self-pay

## 2021-08-16 ENCOUNTER — Telehealth: Payer: Self-pay | Admitting: Internal Medicine

## 2021-08-17 NOTE — Telephone Encounter (Signed)
Created note in error.

## 2021-11-09 ENCOUNTER — Other Ambulatory Visit: Payer: Self-pay | Admitting: Internal Medicine

## 2021-11-09 NOTE — Telephone Encounter (Signed)
Requested medication (s) are due for refill today: yes  Requested medication (s) are on the active medication list: yes  Last refill:  08/14/21  Future visit scheduled: no  Notes to clinic:  had a curtesy refill and asked to make an appt, pt did make appt but appt was canceled by provider. Labs are greater than 360 days ago, please assess.   Requested Prescriptions  Pending Prescriptions Disp Refills   levothyroxine (SYNTHROID) 50 MCG tablet [Pharmacy Med Name: LEVOTHYROXINE 50 MCG TABLET] 90 tablet 0    Sig: TAKE 1 TABLET BY MOUTH EVERY DAY     Endocrinology:  Hypothyroid Agents Failed - 11/09/2021  1:41 AM      Failed - TSH needs to be rechecked within 3 months after an abnormal result. Refill until TSH is due.      Failed - TSH in normal range and within 360 days    TSH  Date Value Ref Range Status  08/23/2020 3.650 0.450 - 4.500 uIU/mL Final          Failed - Valid encounter within last 12 months    Recent Outpatient Visits           1 year ago Upper back pain   [facility] Valentino Nose, NP   2 years ago Gross hematuria   [facility] Particia Nearing, [place]   2 years ago Dysuria   [facility] Roosvelt Maser [place], [place]   2 years ago Encounter for annual physical exam   [facility] [place], Corrie Dandy T, NP   3 years ago Hypothyroidism, unspecified type   [facility] Cr[place], Redge Gainer, MD

## 2021-11-09 NOTE — Telephone Encounter (Signed)
Pt needs appt. Attempted to reach, left VM to call back to schedule. Assisted by Abbott Pao [phone]

## 2021-11-11 ENCOUNTER — Other Ambulatory Visit: Payer: Self-pay | Admitting: Nurse Practitioner

## 2021-11-12 NOTE — Telephone Encounter (Signed)
Apologies for resending multiple times,was correcting documentation.

## 2021-11-12 NOTE — Telephone Encounter (Signed)
Requested medication (s) are due for refill today: yes  Requested medication (s) are on the active medication list: yes  Last refill:  05/21/21 #180 1 RF  Future visit scheduled: no  Notes to clinic:  waited 15 minutes for [place] interpreter and none were available- needs Labs work   Requested Prescriptions  Pending Prescriptions Disp Refills   rosuvastatin (CRESTOR) 20 MG tablet [Pharmacy Med Name: ROSUVASTATIN CALCIUM 20 MG TAB] 90 tablet 3    Sig: TAKE 1 TABLET BY MOUTH EVERY DAY     Cardiovascular:  Antilipid - Statins Failed - 11/11/2021 10:23 AM      Failed - Total Cholesterol in normal range and within 360 days    Cholesterol, Total  Date Value Ref Range Status  08/23/2020 109 100 - 199 mg/dL Final          Failed - LDL in normal range and within 360 days    LDL Chol Calc (NIH)  Date Value Ref Range Status  08/23/2020 33 0 - 99 mg/dL Final          Failed - HDL in normal range and within 360 days    HDL  Date Value Ref Range Status  08/23/2020 49 >39 mg/dL Final          Failed - Triglycerides in normal range and within 360 days    Triglycerides  Date Value Ref Range Status  08/23/2020 161 (H) 0 - 149 mg/dL Final          Failed - Valid encounter within last 12 months    Recent Outpatient Visits           1 year ago Upper back pain   [facility] Valentino Nose, NP   2 years ago Gross hematuria   [facility] Particia Nearing, [place]   2 years ago Dysuria   [facility] [place], Salley Hews, [place]   2 years ago Encounter for annual physical exam   [facility] [place], Corrie Dandy T, NP   3 years ago Hypothyroidism, unspecified type   [facility] [place], Redge Gainer, MD              Passed - Patient is not pregnant

## 2021-11-13 NOTE — Telephone Encounter (Signed)
Lmom asking pt to call back to schedule an appt. °

## 2021-11-15 ENCOUNTER — Other Ambulatory Visit: Payer: Self-pay | Admitting: Internal Medicine

## 2021-11-15 NOTE — Telephone Encounter (Signed)
Copied from CRM [id number]. Topic: Quick Communication - Rx Refill/Question >> Nov 15, 2021  1:44 PM Izora Ribas, Everette A wrote: Medication: rosuvastatin (CRESTOR) 20 MG tablet [[id number]]   Has the patient contacted their pharmacy? Yes.  The patient has been directed to contact their PCP (Agent: If no, request that the patient contact the pharmacy for the refill. If patient does not wish to contact the pharmacy document the reason why and proceed with request.) (Agent: If yes, when and what did the pharmacy advise?)  Preferred Pharmacy (with phone number or street name): [id number] [place], [place] - [address]  Phone:  [phone] Fax:  [phone]  Has the patient been seen for an appointment in the last year OR does the patient have an upcoming appointment? Yes.    Agent: Please be advised that RX refills may take up to 3 business days. We ask that you follow-up with your pharmacy.

## 2021-11-15 NOTE — Telephone Encounter (Signed)
Unable to get [place] interpreter at this time to schedule future appt.

## 2021-11-15 NOTE — Telephone Encounter (Signed)
Requested medication (s) are due for refill today: expired medication  Requested medication (s) are on the active medication list: yes  Last refill:  06/30/20 #90 3 refills   Future visit scheduled: no  Notes to clinic:  expired medication . Do you want to renew Rx? Attempted to call patient via interpreter and no [place] interpreter at this time.      Requested Prescriptions  Pending Prescriptions Disp Refills   rosuvastatin (CRESTOR) 20 MG tablet 90 tablet 3    Sig: Take 1 tablet (20 mg total) by mouth daily.     Cardiovascular:  Antilipid - Statins Failed - 11/15/2021  5:15 PM      Failed - Total Cholesterol in normal range and within 360 days    Cholesterol, Total  Date Value Ref Range Status  08/23/2020 109 100 - 199 mg/dL Final          Failed - LDL in normal range and within 360 days    LDL Chol Calc (NIH)  Date Value Ref Range Status  08/23/2020 33 0 - 99 mg/dL Final          Failed - HDL in normal range and within 360 days    HDL  Date Value Ref Range Status  08/23/2020 49 >39 mg/dL Final          Failed - Triglycerides in normal range and within 360 days    Triglycerides  Date Value Ref Range Status  08/23/2020 161 (H) 0 - 149 mg/dL Final          Failed - Valid encounter within last 12 months    Recent Outpatient Visits           1 year ago Upper back pain   [facility] Valentino Nose, NP   2 years ago Gross hematuria   [facility] Particia Nearing, [place]   2 years ago Dysuria   [facility] [place], Salley Hews, [place]   2 years ago Encounter for annual physical exam   [facility] [place], Corrie Dandy T, NP   3 years ago Hypothyroidism, unspecified type   [facility] [place], Redge Gainer, MD              Passed - Patient is not pregnant

## 2021-11-21 NOTE — Telephone Encounter (Signed)
Left message with husband to get pt scheduled.

## 2021-11-28 ENCOUNTER — Other Ambulatory Visit: Payer: Self-pay | Admitting: Internal Medicine

## 2021-11-29 ENCOUNTER — Other Ambulatory Visit: Payer: Self-pay

## 2021-11-29 ENCOUNTER — Ambulatory Visit (INDEPENDENT_AMBULATORY_CARE_PROVIDER_SITE_OTHER): Payer: Self-pay | Admitting: Internal Medicine

## 2021-11-29 ENCOUNTER — Encounter: Payer: Self-pay | Admitting: Internal Medicine

## 2021-11-29 VITALS — BP 154/87 | HR 88 | Temp 98.3°F | Ht <= 58 in | Wt 114.8 lb

## 2021-11-29 DIAGNOSIS — J029 Acute pharyngitis, unspecified: Secondary | ICD-10-CM

## 2021-11-29 DIAGNOSIS — R309 Painful micturition, unspecified: Secondary | ICD-10-CM

## 2021-11-29 DIAGNOSIS — R059 Cough, unspecified: Secondary | ICD-10-CM

## 2021-11-29 LAB — URINALYSIS, ROUTINE W REFLEX MICROSCOPIC
Bilirubin, UA: NEGATIVE
Glucose, UA: NEGATIVE
Ketones, UA: NEGATIVE
Leukocytes,UA: NEGATIVE
Nitrite, UA: NEGATIVE
Protein,UA: NEGATIVE
RBC, UA: NEGATIVE
Specific Gravity, UA: 1.01 (ref 1.005–1.030)
Urobilinogen, Ur: 0.2 mg/dL (ref 0.2–1.0)
pH, UA: 6 (ref 5.0–7.5)

## 2021-11-29 MED ORDER — FEXOFENADINE HCL 180 MG PO TABS: 180.0000 mg | ORAL_TABLET | Freq: Every day | ORAL | 1 refills | Status: DC

## 2021-11-29 MED ORDER — ROSUVASTATIN CALCIUM 20 MG PO TABS: 20.0000 mg | ORAL_TABLET | Freq: Every day | ORAL | 0 refills | Status: DC

## 2021-11-29 MED ORDER — LEVOTHYROXINE SODIUM 50 MCG PO TABS: 50.0000 ug | ORAL_TABLET | Freq: Every day | ORAL | 0 refills | Status: DC

## 2021-11-29 MED ORDER — BENZONATATE 100 MG PO CAPS: 100.0000 mg | ORAL_CAPSULE | Freq: Three times a day (TID) | ORAL | 0 refills | Status: DC | PRN

## 2021-11-29 NOTE — Telephone Encounter (Signed)
Last ordered today. Pharmacy confirmed receipt. This is a duplicate and not needed.

## 2021-11-29 NOTE — Progress Notes (Signed)
BP (!) 154/87    Pulse 88    Temp 98.3 F (36.8 C) (Oral)    Ht 4' 9.09" (1.45 m)    Wt 114 lb 12.8 oz (52.1 kg)    LMP  (LMP Unknown)    SpO2 98%    BMI 24.77 kg/m    Subjective:    Patient ID: Katherine [name], female    DOB: December 29, 1955, 66 y.o.   MRN: [id number]  Chief Complaint  Patient presents with   Cough    W/ nasal congestion for the past week   burning while urinating    For the past 2 weeks    HPI: Katherine [name] is a 65 y.o. female  [place]  Cough started after this   Has had a problem with peeing - has had some tingling and burning says she pees every time she coughs denies any fevers or chills  Cough This is a new problem. The current episode started 1 to 4 weeks ago. The problem has been waxing and waning. Associated symptoms include nasal congestion and rhinorrhea. Pertinent negatives include no chest pain, chills, ear congestion, ear pain, fever, headaches, heartburn, hemoptysis, myalgias, postnasal drip, rash, sore throat, shortness of breath, sweats, weight loss or wheezing.   Chief Complaint  Patient presents with   Cough    W/ nasal congestion for the past week   burning while urinating    For the past 2 weeks    Relevant past medical, surgical, family and social history reviewed and updated as indicated. Interim medical history since our last visit reviewed. Allergies and medications reviewed and updated.  Review of Systems  Constitutional:  Negative for chills, fever and weight loss.  HENT:  Positive for rhinorrhea. Negative for ear pain, postnasal drip and sore throat.   Respiratory:  Positive for cough. Negative for hemoptysis, shortness of breath and wheezing.   Cardiovascular:  Negative for chest pain.  Gastrointestinal:  Negative for heartburn.  Musculoskeletal:  Negative for myalgias.  Skin:  Negative for rash.  Neurological:  Negative for headaches.   Per HPI unless specifically indicated above      Objective:    BP (!) 154/87    Pulse 88    Temp 98.3 F (36.8 C) (Oral)    Ht 4' 9.09" (1.45 m)    Wt 114 lb 12.8 oz (52.1 kg)    LMP  (LMP Unknown)    SpO2 98%    BMI 24.77 kg/m   Wt Readings from Last 3 Encounters:  11/29/21 114 lb 12.8 oz (52.1 kg)  08/23/20 121 lb (54.9 kg)  09/09/19 115 lb (52.2 kg)    Physical Exam Vitals and nursing note reviewed.  Constitutional:      General: She is not in acute distress.    Appearance: Normal appearance. She is not ill-appearing or diaphoretic.  Eyes:     Extraocular Movements: Extraocular movements intact.     Conjunctiva/sclera: Conjunctivae normal.     Pupils: Pupils are equal, round, and reactive to light.  Cardiovascular:     Rate and Rhythm: Normal rate and regular rhythm.     Heart sounds: Normal heart sounds. No murmur heard. Pulmonary:     Effort: No respiratory distress.     Breath sounds: No wheezing or rhonchi.  Chest:     Chest wall: No tenderness.  Abdominal:     Palpations: Abdomen is soft. There is no mass.  Skin:  General: Skin is warm and dry.     Coloration: Skin is not jaundiced.     Findings: No erythema.  Neurological:     Mental Status: She is alert.  Psychiatric:        Mood and Affect: Mood normal.        Behavior: Behavior normal.        Thought Content: Thought content normal.    Results for orders placed or performed in visit on 08/23/20  TSH  Result Value Ref Range   TSH 3.650 0.450 - 4.500 uIU/mL  Lipid Panel w/o Chol/HDL Ratio  Result Value Ref Range   Cholesterol, Total 109 100 - 199 mg/dL   Triglycerides 213161 (H) 0 - 149 mg/dL   HDL 49 >08>39 mg/dL   VLDL Cholesterol Cal 27 5 - 40 mg/dL   LDL Chol Calc (NIH) 33 0 - 99 mg/dL  Comprehensive metabolic panel  Result Value Ref Range   Glucose 108 (H) 65 - 99 mg/dL   BUN 13 8 - 27 mg/dL   Creatinine, Ser 6.570.94 0.57 - 1.00 mg/dL   GFR calc non Af Amer 66 >59 mL/min/1.73   GFR calc Af Amer 76 >59 mL/min/1.73   BUN/Creatinine Ratio 14 12 - 28    Sodium 140 134 - 144 mmol/L   Potassium 4.0 3.5 - 5.2 mmol/L   Chloride 107 (H) 96 - 106 mmol/L   CO2 20 20 - 29 mmol/L   Calcium 8.7 8.7 - 10.3 mg/dL   Total Protein 7.6 6.0 - 8.5 g/dL   Albumin 4.5 3.8 - 4.8 g/dL   Globulin, Total 3.1 1.5 - 4.5 g/dL   Albumin/Globulin Ratio 1.5 1.2 - 2.2   Bilirubin Total <0.2 0.0 - 1.2 mg/dL   Alkaline Phosphatase 93 44 - 121 IU/L   AST 27 0 - 40 IU/L   ALT 23 0 - 32 IU/L        Current Outpatient Medications:    albuterol (VENTOLIN HFA) 108 (90 Base) MCG/ACT inhaler, Inhale 2 puffs into the lungs every 6 (six) hours as needed for wheezing or shortness of breath., Disp: 1 Inhaler, Rfl: 3   levothyroxine (SYNTHROID) 50 MCG tablet, TAKE 1 TABLET BY MOUTH EVERY DAY, Disp: 90 tablet, Rfl: 0   rosuvastatin (CRESTOR) 20 MG tablet, TAKE 1 TABLET BY MOUTH EVERY DAY, Disp: 90 tablet, Rfl: 3   predniSONE (DELTASONE) 10 MG tablet, Take 3 tablets (30 mg total) by mouth daily with breakfast. (Patient not taking: Reported on 11/29/2021), Disp: 12 tablet, Rfl: 0    Assessment & Plan:  Cough x 1week s/p international travel Check for COVID FLU AND STREP Increase fluid intake.  Headahce - tyelnol every 4-6 hrs prn and alternate this with ibubrufen 800 mg q 8 hrly. Sinus pressure: use steam inhalation.  OTC -  Allegra / claritin.  Pt verbalized understanding of such UA -ve for UTI's advised pt to increase water intake and prevent being dehydrated.  Patient has not been seen in over a year and will need to follow-up for chronic medical problems refill sent for her chronic meds Problem List Items Addressed This Visit   None Visit Diagnoses     Painful urination    -  Primary   Relevant Orders   Urinalysis, Routine w reflex microscopic   Urine Culture        Orders Placed This Encounter  Procedures   Urine Culture   Urinalysis, Routine w reflex microscopic     No  orders of the defined types were placed in this encounter.    Follow up plan: No  follow-ups on file.

## 2021-12-01 LAB — NOVEL CORONAVIRUS, NAA: SARS-CoV-2, NAA: NOT DETECTED

## 2021-12-01 LAB — SARS-COV-2, NAA 2 DAY TAT

## 2021-12-01 LAB — URINE CULTURE

## 2021-12-02 LAB — VERITOR FLU A/B WAIVED
Influenza A: NEGATIVE
Influenza B: NEGATIVE

## 2021-12-02 LAB — RAPID STREP SCREEN (MED CTR MEBANE ONLY): Strep Gp A Ag, IA W/Reflex: NEGATIVE

## 2021-12-02 LAB — CULTURE, GROUP A STREP: Strep A Culture: NEGATIVE

## 2021-12-06 ENCOUNTER — Other Ambulatory Visit: Payer: Self-pay

## 2021-12-13 ENCOUNTER — Ambulatory Visit: Payer: Self-pay | Admitting: Internal Medicine

## 2021-12-19 ENCOUNTER — Telehealth: Payer: Self-pay | Admitting: Internal Medicine

## 2021-12-19 NOTE — Telephone Encounter (Signed)
Copied from CRM [id number]. Topic: Appointment Scheduling - Scheduling Inquiry for Clinic >> Dec 19, 2021  9:55 AM Katherine [name] wrote: Reason for CRM: daughter states pt is not feel as though she "connects" with Dr Charlotta Newton.  Her family members see [name].  Pt would like to know if she can switch to [name] ?.please. Please call the daughter, as she will be coming to appts w/ the pt.  She is aware she needs to sign a DPR at next visit as well.thank you.

## 2021-12-20 NOTE — Telephone Encounter (Signed)
Switch has been made and lmom asking pt to call back to schedule an appt with [name]

## 2021-12-31 NOTE — Patient Instructions (Addendum)
IMAGING LOCATION: [address] [place], [place] [address]  Please call to schedule your mammogram and/or bone density: [facility] at [facility]  Address: [address] [place], [place], [place] [address]  Phone: [phone]   Mammogram A mammogram is an X-ray of the breasts. This is done to check for changes that are not normal. This test can look for changes that may be caused by breast cancer or other problems. Mammograms are regularly done on women beginning at age 2. A man may have a mammogram if he has a lump or swelling in his breast. Tell a doctor: About any allergies you have. If you have breast implants. If you have had breast disease, biopsy, or surgery. If you have a family history of breast cancer. If you are breastfeeding. Whether you are pregnant or may be pregnant. What are the risks? Generally, this is a safe procedure. But problems may occur, including: Being exposed to radiation. Radiation levels are very low with this test. The need for more tests. The results were not read properly. Trouble finding breast cancer in women with dense breasts. What happens before the test? Have this test done about 1-2 weeks after your menstrual period. This is often when your breasts are the least tender. If you are visiting a new doctor or clinic, have any past mammogram images sent to your new doctor's office. Wash your breasts and under your arms on the day of the test. Do not use deodorants, perfumes, lotions, or powders on the day of the test. Take off any jewelry from your neck. Wear clothes that you can change into and out of easily. What happens during the test?  You will take off your clothes from the waist up. You will put on a gown. You will stand in front of the X-ray machine. Each breast will be placed between two plastic or glass plates. The plates will press down on your breast for a few seconds. Try to relax. This does not cause any harm to your  breasts. It may not feel comfortable, but it will be very brief. X-rays will be taken from different angles of each breast. The procedure may vary among doctors and hospitals. What can I expect after the test? The mammogram will be read by a specialist (radiologist). You may need to do parts of the test again. This depends on the quality of the images. You may go back to your normal activities. It is up to you to get the results of your test. Ask how to get your results when they are ready. Summary A mammogram is an X-ray of the breasts. It looks for changes that may be caused by breast cancer or other problems. A man may have this test if he has a lump or swelling in his breast. Before the test, tell your doctor about any breast problems that you have had in the past. Have this test done about 1-2 weeks after your menstrual period. Ask when your test results will be ready. Make sure you get your test results. This information is not intended to replace advice given to you by your health care provider. Make sure you discuss any questions you have with your health care provider. Document Revised: 07/26/2021 Document Reviewed: 09/12/2020 Elsevier Patient Education  2022 [employer].

## 2022-01-05 ENCOUNTER — Ambulatory Visit (INDEPENDENT_AMBULATORY_CARE_PROVIDER_SITE_OTHER): Payer: PRIVATE HEALTH INSURANCE | Admitting: Nurse Practitioner

## 2022-01-05 ENCOUNTER — Other Ambulatory Visit: Payer: Self-pay

## 2022-01-05 ENCOUNTER — Encounter: Payer: Self-pay | Admitting: Nurse Practitioner

## 2022-01-05 VITALS — BP 132/78 | HR 90 | Temp 98.6°F | Ht <= 58 in | Wt 114.0 lb

## 2022-01-05 DIAGNOSIS — Z0000 Encounter for general adult medical examination without abnormal findings: Secondary | ICD-10-CM | POA: Diagnosis not present

## 2022-01-05 DIAGNOSIS — Z1231 Encounter for screening mammogram for malignant neoplasm of breast: Secondary | ICD-10-CM

## 2022-01-05 DIAGNOSIS — Z23 Encounter for immunization: Secondary | ICD-10-CM | POA: Diagnosis not present

## 2022-01-05 DIAGNOSIS — G629 Polyneuropathy, unspecified: Secondary | ICD-10-CM

## 2022-01-05 DIAGNOSIS — E039 Hypothyroidism, unspecified: Secondary | ICD-10-CM

## 2022-01-05 DIAGNOSIS — E782 Mixed hyperlipidemia: Secondary | ICD-10-CM

## 2022-01-05 DIAGNOSIS — R399 Unspecified symptoms and signs involving the genitourinary system: Secondary | ICD-10-CM

## 2022-01-05 DIAGNOSIS — E559 Vitamin D deficiency, unspecified: Secondary | ICD-10-CM | POA: Diagnosis not present

## 2022-01-05 DIAGNOSIS — J4520 Mild intermittent asthma, uncomplicated: Secondary | ICD-10-CM | POA: Diagnosis not present

## 2022-01-05 DIAGNOSIS — E538 Deficiency of other specified B group vitamins: Secondary | ICD-10-CM

## 2022-01-05 LAB — URINALYSIS, ROUTINE W REFLEX MICROSCOPIC
Bilirubin, UA: NEGATIVE
Glucose, UA: NEGATIVE
Ketones, UA: NEGATIVE
Leukocytes,UA: NEGATIVE
Nitrite, UA: NEGATIVE
Protein,UA: NEGATIVE
RBC, UA: NEGATIVE
Specific Gravity, UA: 1.01 (ref 1.005–1.030)
Urobilinogen, Ur: 0.2 mg/dL (ref 0.2–1.0)
pH, UA: 5.5 (ref 5.0–7.5)

## 2022-01-05 LAB — WET PREP FOR TRICH, YEAST, CLUE
Clue Cell Exam: NEGATIVE
Trichomonas Exam: NEGATIVE
Yeast Exam: NEGATIVE

## 2022-01-05 MED ORDER — SHINGRIX 50 MCG/0.5ML IM SUSR: 0.5000 mL | Freq: Once | INTRAMUSCULAR | 0 refills | Status: AC

## 2022-01-05 MED ORDER — ALBUTEROL SULFATE HFA 108 (90 BASE) MCG/ACT IN AERS: 2.0000 | INHALATION_SPRAY | Freq: Four times a day (QID) | RESPIRATORY_TRACT | 5 refills | Status: DC | PRN

## 2022-01-05 MED ORDER — TIZANIDINE HCL 4 MG PO TABS: 4.0000 mg | ORAL_TABLET | Freq: Four times a day (QID) | ORAL | 0 refills | Status: DC | PRN

## 2022-01-05 MED ORDER — GABAPENTIN 100 MG PO CAPS: 100.0000 mg | ORAL_CAPSULE | Freq: Every day | ORAL | 3 refills | Status: DC

## 2022-01-05 NOTE — Assessment & Plan Note (Signed)
Chronic, ongoing.  Continue current medication regimen and adjust as needed.  Lipid panel and CMP today.    
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
## 2022-01-05 NOTE — Assessment & Plan Note (Signed)
Chronic, stable with minimal Albuterol use.  Continue to monitor and adjust regimen as needed.  Consider spirometry in future.

## 2022-01-05 NOTE — Assessment & Plan Note (Signed)
Ongoing issue to bilateral lower legs.  Will obtain imaging of lumbar spine and check labs today -- CBC, CMP, B12.  Start Gabapentin 100 MG QHS for discomfort and Tizanidine 4 MG as needed.  Return in 4 weeks for follow-up.  UA negative today.

## 2022-01-05 NOTE — Assessment & Plan Note (Signed)
Chronic, ongoing.  Continue current medication regimen and adjust as needed.  Assess thyroid labs today. 

## 2022-01-06 LAB — COMPREHENSIVE METABOLIC PANEL
ALT: 23 IU/L (ref 0–32)
AST: 23 IU/L (ref 0–40)
Albumin/Globulin Ratio: 1.1 — ABNORMAL LOW (ref 1.2–2.2)
Albumin: 4.1 g/dL (ref 3.8–4.8)
Alkaline Phosphatase: 83 IU/L (ref 44–121)
BUN/Creatinine Ratio: 25 (ref 12–28)
BUN: 14 mg/dL (ref 8–27)
Bilirubin Total: 0.3 mg/dL (ref 0.0–1.2)
CO2: 26 mmol/L (ref 20–29)
Calcium: 9.3 mg/dL (ref 8.7–10.3)
Chloride: 103 mmol/L (ref 96–106)
Creatinine, Ser: 0.57 mg/dL (ref 0.57–1.00)
Globulin, Total: 3.8 g/dL (ref 1.5–4.5)
Glucose: 95 mg/dL (ref 70–99)
Potassium: 4.2 mmol/L (ref 3.5–5.2)
Sodium: 140 mmol/L (ref 134–144)
Total Protein: 7.9 g/dL (ref 6.0–8.5)
eGFR: 102 mL/min/{1.73_m2} (ref >59)

## 2022-01-06 LAB — CBC WITH DIFFERENTIAL/PLATELET
Basophils Absolute: 0.1 10*3/uL (ref 0.0–0.2)
Basos: 1 %
EOS (ABSOLUTE): 0.2 10*3/uL (ref 0.0–0.4)
Eos: 2 %
Hematocrit: 41.3 % (ref 34.0–46.6)
Hemoglobin: 13.2 g/dL (ref 11.1–15.9)
Immature Grans (Abs): 0 10*3/uL (ref 0.0–0.1)
Immature Granulocytes: 0 %
Lymphocytes Absolute: 2.4 10*3/uL (ref 0.7–3.1)
Lymphs: 27 %
MCH: 25.5 pg — ABNORMAL LOW (ref 26.6–33.0)
MCHC: 32 g/dL (ref 31.5–35.7)
MCV: 80 fL (ref 79–97)
Monocytes Absolute: 0.8 10*3/uL (ref 0.1–0.9)
Monocytes: 9 %
Neutrophils Absolute: 5.5 10*3/uL (ref 1.4–7.0)
Neutrophils: 61 %
Platelets: 252 10*3/uL (ref 150–450)
RBC: 5.18 x10E6/uL (ref 3.77–5.28)
RDW: 14.7 % (ref 11.7–15.4)
WBC: 8.9 10*3/uL (ref 3.4–10.8)

## 2022-01-06 LAB — VITAMIN B12: Vitamin B-12: 607 pg/mL (ref 232–1245)

## 2022-01-06 LAB — LIPID PANEL W/O CHOL/HDL RATIO
Cholesterol, Total: 119 mg/dL (ref 100–199)
HDL: 46 mg/dL (ref >39)
LDL Chol Calc (NIH): 43 mg/dL (ref 0–99)
Triglycerides: 184 mg/dL — ABNORMAL HIGH (ref 0–149)
VLDL Cholesterol Cal: 30 mg/dL (ref 5–40)

## 2022-01-06 LAB — THYROID PEROXIDASE ANTIBODY: Thyroperoxidase Ab SerPl-aCnc: 13 IU/mL (ref 0–34)

## 2022-01-06 LAB — TSH: TSH: 2.27 u[IU]/mL (ref 0.450–4.500)

## 2022-01-06 LAB — T4, FREE: Free T4: 1.09 ng/dL (ref 0.82–1.77)

## 2022-01-06 LAB — VITAMIN D 25 HYDROXY (VIT D DEFICIENCY, FRACTURES): Vit D, 25-Hydroxy: 35.3 ng/mL (ref 30.0–100.0)

## 2022-01-06 NOTE — Progress Notes (Signed)
Good morning, please let Katherine [name] know her labs have returned and everything is stable.  No medication changes needed.  Kidney and liver function normal.  Thyroid labs stable.  Vitamin D and B12 normal.  Any questions? Keep being stellar!!  Thank you for allowing me to participate in your care.  I appreciate you. Kindest regards, Sincerity Cedar

## 2022-01-08 ENCOUNTER — Other Ambulatory Visit: Payer: Self-pay

## 2022-01-08 ENCOUNTER — Ambulatory Visit
Admission: RE | Admit: 2022-01-08 | Discharge: 2022-01-08 | Disposition: A | Discharge status: Home / Self Care | Payer: 59 | Source: Ambulatory Visit | Attending: Nurse Practitioner | Admitting: Nurse Practitioner

## 2022-01-08 ENCOUNTER — Ambulatory Visit
Admission: RE | Admit: 2022-01-08 | Discharge: 2022-01-08 | Disposition: A | Discharge status: Home / Self Care | Payer: 59 | Attending: Nurse Practitioner | Admitting: Nurse Practitioner

## 2022-01-08 DIAGNOSIS — G629 Polyneuropathy, unspecified: Secondary | ICD-10-CM | POA: Diagnosis present

## 2022-01-08 NOTE — Progress Notes (Signed)
Please let Katherine [name] know her back imaging returned normal with no arthritic changes or nerve compression noted.  Good news:)

## 2022-01-19 ENCOUNTER — Other Ambulatory Visit: Payer: Self-pay

## 2022-01-19 ENCOUNTER — Encounter: Payer: Self-pay | Admitting: Nurse Practitioner

## 2022-01-19 ENCOUNTER — Ambulatory Visit (INDEPENDENT_AMBULATORY_CARE_PROVIDER_SITE_OTHER): Payer: PRIVATE HEALTH INSURANCE | Admitting: Nurse Practitioner

## 2022-01-19 VITALS — BP 132/78 | HR 85 | Temp 98.4°F | Ht <= 58 in | Wt 114.8 lb

## 2022-01-19 DIAGNOSIS — G629 Polyneuropathy, unspecified: Secondary | ICD-10-CM

## 2022-01-19 DIAGNOSIS — M5441 Lumbago with sciatica, right side: Secondary | ICD-10-CM | POA: Diagnosis not present

## 2022-01-19 DIAGNOSIS — E039 Hypothyroidism, unspecified: Secondary | ICD-10-CM | POA: Diagnosis not present

## 2022-01-19 DIAGNOSIS — E782 Mixed hyperlipidemia: Secondary | ICD-10-CM | POA: Diagnosis not present

## 2022-01-19 DIAGNOSIS — G8929 Other chronic pain: Secondary | ICD-10-CM

## 2022-01-19 MED ORDER — LEVOTHYROXINE SODIUM 50 MCG PO TABS: 50.0000 ug | ORAL_TABLET | Freq: Every day | ORAL | 4 refills | Status: DC

## 2022-01-19 MED ORDER — FEXOFENADINE HCL 180 MG PO TABS: 180.0000 mg | ORAL_TABLET | Freq: Every day | ORAL | 4 refills | Status: DC

## 2022-01-19 MED ORDER — ROSUVASTATIN CALCIUM 20 MG PO TABS: 20.0000 mg | ORAL_TABLET | Freq: Every day | ORAL | 4 refills | Status: DC

## 2022-01-19 NOTE — Assessment & Plan Note (Signed)
Refer to low back pain plan of care. 

## 2022-01-19 NOTE — Patient Instructions (Addendum)
I recommend starting Voltaren gel, which you can get over the counter, and applying to back as needed.  Plus recommend a heating pad and TENS machine to area.  Chronic Back Pain When back pain lasts longer than 3 months, it is called chronic back pain. Pain may get worse at certain times (flare-ups). There are things you can do at home to manage your pain. Follow these instructions at home: Pay attention to any changes in your symptoms. Take these actions to help with your pain: Managing pain and stiffness   If told, put ice on the painful area. Your doctor may tell you to use ice for 24-48 hours after the flare-up starts. To do this: Put ice in a plastic bag. Place a towel between your skin and the bag. Leave the ice on for 20 minutes, 2-3 times a day. If told, put heat on the painful area. Do this as often as told by your doctor. Use the heat source that your doctor recommends, such as a moist heat pack or a heating pad. Place a towel between your skin and the heat source. Leave the heat on for 20-30 minutes. Take off the heat if your skin turns bright red. This is especially important if you are unable to feel pain, heat, or cold. You may have a greater risk of getting burned. Soak in a warm bath. This can help relieve pain. Activity  Avoid bending and other activities that make pain worse. When standing: Keep your upper back and neck straight. Keep your shoulders pulled back. Avoid slouching. When sitting: Keep your back straight. Relax your shoulders. Do not round your shoulders or pull them backward. Do not sit or stand in one place for long periods of time. Take short rest breaks during the day. Lying down or standing is usually better than sitting. Resting can help relieve pain. When sitting or lying down for a long time, do some mild activity or stretching. This will help to prevent stiffness and pain. Get regular exercise. Ask your doctor what activities are safe for you. Do  not lift anything that is heavier than 10 lb (4.5 kg) or the limit that you are told, until your doctor says that it is safe. To prevent injury when you lift things: Bend your knees. Keep the weight close to your body. Avoid twisting. Sleep on a firm mattress. Try lying on your side with your knees slightly bent. If you lie on your back, put a pillow under your knees. Medicines Treatment may include medicines for pain and swelling taken by mouth or put on the skin, prescription pain medicine, or muscle relaxants. Take over-the-counter and prescription medicines only as told by your doctor. Ask your doctor if the medicine prescribed to you: Requires you to avoid driving or using machinery. Can cause trouble pooping (constipation). You may need to take these actions to prevent or treat trouble pooping: Drink enough fluid to keep your pee (urine) pale yellow. Take over-the-counter or prescription medicines. Eat foods that are high in fiber. These include beans, whole grains, and fresh fruits and vegetables. Limit foods that are high in fat and sugars. These include fried or sweet foods. General instructions Do not use any products that contain nicotine or tobacco, such as cigarettes, e-cigarettes, and chewing tobacco. If you need help quitting, ask your doctor. Keep all follow-up visits as told by your doctor. This is important. Contact a doctor if: Your pain does not get better with rest or medicine. Your pain  gets worse, or you have new pain. You have a high fever. You lose weight very quickly. You have trouble doing your normal activities. Get help right away if: One or both of your legs or feet feel weak. One or both of your legs or feet lose feeling (have numbness). You have trouble controlling when you poop (have a bowel movement) or pee (urinate). You have bad back pain and: You feel like you may vomit (nauseous), or you vomit. You have pain in your belly (abdomen). You have  shortness of breath. You faint. Summary When back pain lasts longer than 3 months, it is called chronic back pain. Pain may get worse at certain times (flare-ups). Use ice and heat as told by your doctor. Your doctor may tell you to use ice after flare-ups. This information is not intended to replace advice given to you by your health care provider. Make sure you discuss any questions you have with your health care provider. Document Revised: 12/23/2019 Document Reviewed: 12/23/2019 Elsevier Patient Education  2022 [employer].

## 2022-01-19 NOTE — Progress Notes (Signed)
BP 132/78    Pulse 85    Temp 98.4 F (36.9 C) (Oral)    Ht 4' 10" (1.473 m)    Wt 114 lb 12.8 oz (52.1 kg)    LMP  (LMP Unknown)    SpO2 99%    BMI 23.99 kg/m    Subjective:    Patient ID: Katherine [name], female    DOB: June 04, 1956, 66 y.o.   MRN: [id number]  HPI: Katherine [name] is a 66 y.o. female  Chief Complaint  Patient presents with   Back Pain    Patient states she is feeling a little bit better with the help of the medication she was prescribed. Patient states she still has some pain in her left side of back and it radiates down into the patient's arm.    BACK PAIN Started on Gabapentin 100 MG at night last visit, 01/05/22 = she reports the nerve pain is improving some.  Has pain at baseline to right side lower back.  Had imaging and this was reassuring with no acute findings.  Does endorse cleaning house can cause agitation -- tried muscle relaxer and this one made her sleepy.    Back is 75% better. Duration: months Mechanism of injury: unknown Location: Right and low back Onset: gradual Severity: 5/10 Quality: dull, aching, and throbbing Frequency: intermittent Radiation: R leg above the knee Aggravating factors: lifting, movement, and bending Alleviating factors: Gabapentin Status: stable Treatments attempted: Gabapentin  Relief with NSAIDs?: No NSAIDs Taken Nighttime pain:  no Paresthesias / decreased sensation:  no Bowel / bladder incontinence:  no Fevers:  no Dysuria / urinary frequency:  no   Relevant past medical, surgical, family and social history reviewed and updated as indicated. Interim medical history since our last visit reviewed. Allergies and medications reviewed and updated.  Review of Systems  Constitutional:  Negative for activity change, appetite change, diaphoresis, fatigue and fever.  Respiratory:  Negative for cough, chest tightness and shortness of breath.   Cardiovascular:  Negative for chest pain, palpitations and leg swelling.   Gastrointestinal: Negative.   Musculoskeletal:  Positive for back pain.  Neurological: Negative.   Psychiatric/Behavioral: Negative.     Per HPI unless specifically indicated above     Objective:    BP 132/78    Pulse 85    Temp 98.4 F (36.9 C) (Oral)    Ht 4' 10" (1.473 m)    Wt 114 lb 12.8 oz (52.1 kg)    LMP  (LMP Unknown)    SpO2 99%    BMI 23.99 kg/m   Wt Readings from Last 3 Encounters:  01/19/22 114 lb 12.8 oz (52.1 kg)  01/05/22 114 lb (51.7 kg)  11/29/21 114 lb 12.8 oz (52.1 kg)    Physical Exam Vitals and nursing note reviewed.  Constitutional:      General: She is awake. She is not in acute distress.    Appearance: She is well-developed and well-groomed. She is not ill-appearing or toxic-appearing.  HENT:     Head: Normocephalic.     Right Ear: Hearing normal.     Left Ear: Hearing normal.  Eyes:     General: Lids are normal.        Right eye: No discharge.        Left eye: No discharge.     Conjunctiva/sclera: Conjunctivae normal.     Pupils: Pupils are equal, round, and reactive to light.  Neck:     Thyroid: No thyromegaly.  Vascular: No carotid bruit.  Cardiovascular:     Rate and Rhythm: Normal rate and regular rhythm.     Heart sounds: Normal heart sounds. No murmur heard.   No gallop.  Pulmonary:     Effort: Pulmonary effort is normal. No accessory muscle usage or respiratory distress.     Breath sounds: Normal breath sounds.  Abdominal:     General: Bowel sounds are normal.     Palpations: Abdomen is soft. There is no hepatomegaly or splenomegaly.  Musculoskeletal:     Cervical back: Normal range of motion and neck supple.     Lumbar back: Spasms and tenderness (to SI area bilaterally) present. No swelling, edema or bony tenderness. Normal range of motion. Negative right straight leg raise test and negative left straight leg raise test.     Right lower leg: No edema.     Left lower leg: No edema.  Lymphadenopathy:     Cervical: No cervical  adenopathy.  Skin:    General: Skin is warm and dry.  Neurological:     Mental Status: She is alert and oriented to [name], place, and time.  Psychiatric:        Attention and Perception: Attention normal.        Mood and Affect: Mood normal.        Speech: Speech normal.        Behavior: Behavior normal. Behavior is cooperative.        Thought Content: Thought content normal.    Results for orders placed or performed in visit on 01/05/22  WET PREP FOR [place], YEAST, CLUE   Specimen: Sterile Swab   Sterile Swab  Result Value Ref Range   Trichomonas Exam Negative Negative   Yeast Exam Negative Negative   Clue Cell Exam Negative Negative  T4, free  Result Value Ref Range   Free T4 1.09 0.82 - 1.77 ng/dL  TSH  Result Value Ref Range   TSH 2.270 0.450 - 4.500 uIU/mL  Thyroid peroxidase antibody  Result Value Ref Range   Thyroperoxidase Ab SerPl-aCnc 13 0 - 34 IU/mL  CBC with Differential/Platelet  Result Value Ref Range   WBC 8.9 3.4 - 10.8 x10E3/uL   RBC 5.18 3.77 - 5.28 x10E6/uL   Hemoglobin 13.2 11.1 - 15.9 g/dL   Hematocrit 41.3 34.0 - 46.6 %   MCV 80 79 - 97 fL   MCH 25.5 (L) 26.6 - 33.0 pg   MCHC 32.0 31.5 - 35.7 g/dL   RDW 14.7 11.7 - 15.4 %   Platelets 252 150 - 450 x10E3/uL   Neutrophils 61 Not Estab. %   Lymphs 27 Not Estab. %   Monocytes 9 Not Estab. %   Eos 2 Not Estab. %   Basos 1 Not Estab. %   Neutrophils Absolute 5.5 1.4 - 7.0 x10E3/uL   Lymphocytes Absolute 2.4 0.7 - 3.1 x10E3/uL   Monocytes Absolute 0.8 0.1 - 0.9 x10E3/uL   EOS (ABSOLUTE) 0.2 0.0 - 0.4 x10E3/uL   Basophils Absolute 0.1 0.0 - 0.2 x10E3/uL   Immature Granulocytes 0 Not Estab. %   Immature Grans (Abs) 0.0 0.0 - 0.1 x10E3/uL  Comprehensive metabolic panel  Result Value Ref Range   Glucose 95 70 - 99 mg/dL   BUN 14 8 - 27 mg/dL   Creatinine, Ser 0.57 0.57 - 1.00 mg/dL   eGFR 102 >59 mL/min/1.73   BUN/Creatinine Ratio 25 12 - 28   Sodium 140 134 - 144 mmol/L   Potassium  4.2 3.5 -  5.2 mmol/L   Chloride 103 96 - 106 mmol/L   CO2 26 20 - 29 mmol/L   Calcium 9.3 8.7 - 10.3 mg/dL   Total Protein 7.9 6.0 - 8.5 g/dL   Albumin 4.1 3.8 - 4.8 g/dL   Globulin, Total 3.8 1.5 - 4.5 g/dL   Albumin/Globulin Ratio 1.1 (L) 1.2 - 2.2   Bilirubin Total 0.3 0.0 - 1.2 mg/dL   Alkaline Phosphatase 83 44 - 121 IU/L   AST 23 0 - 40 IU/L   ALT 23 0 - 32 IU/L  Lipid Panel w/o Chol/HDL Ratio  Result Value Ref Range   Cholesterol, Total 119 100 - 199 mg/dL   Triglycerides 184 (H) 0 - 149 mg/dL   HDL 46 >39 mg/dL   VLDL Cholesterol Cal 30 5 - 40 mg/dL   LDL Chol Calc (NIH) 43 0 - 99 mg/dL  VITAMIN D 25 Hydroxy (Vit-D Deficiency, Fractures)  Result Value Ref Range   Vit D, 25-Hydroxy 35.3 30.0 - 100.0 ng/mL  Vitamin B12  Result Value Ref Range   Vitamin B-12 607 232 - 1,245 pg/mL  Urinalysis, Routine w reflex microscopic  Result Value Ref Range   Specific Gravity, UA 1.010 1.005 - 1.030   pH, UA 5.5 5.0 - 7.5   Color, UA Yellow Yellow   Appearance Ur Clear Clear   Leukocytes,UA Negative Negative   Protein,UA Negative Negative/Trace   Glucose, UA Negative Negative   Ketones, UA Negative Negative   RBC, UA Negative Negative   Bilirubin, UA Negative Negative   Urobilinogen, Ur 0.2 0.2 - 1.0 mg/dL   Nitrite, UA Negative Negative      Assessment & Plan:   Problem List Items Addressed This Visit       Endocrine   Hypothyroid   Relevant Medications   levothyroxine (SYNTHROID) 50 MCG tablet   fexofenadine (ALLEGRA ALLERGY) 180 MG tablet     Nervous and Auditory   Neuropathy    Refer to low back pain plan of care.        Other   Hyperlipidemia   Relevant Medications   rosuvastatin (CRESTOR) 20 MG tablet   Low back pain - Primary    Chronic, ongoing with 75% improvement with addition of Gabapentin.  Recent imaging no acute issues noted.  She did not tolerate Tizanidine, even at low dosing.  At this time continue Gabapentin.  Recommend she start Voltaren gel at home +  using TENS machine as needed and Icy/Hot patches.  If any worsening then would consider MRI and possible PT referral.        Follow up plan: Return in about 5 months (around 06/18/2022) for THYROID, ASTHMA, HLD, BACK PAIN.

## 2022-01-19 NOTE — Assessment & Plan Note (Signed)
Chronic, ongoing with 75% improvement with addition of Gabapentin.  Recent imaging no acute issues noted.  She did not tolerate Tizanidine, even at low dosing.  At this time continue Gabapentin.  Recommend she start Voltaren gel at home + using TENS machine as needed and Icy/Hot patches.  If any worsening then would consider MRI and possible PT referral.

## 2022-02-10 NOTE — Patient Instructions (Addendum)
Take Claritin 10 MG daily over the counter for allergies. ? ?Cough, Adult ?A cough helps to clear your throat and lungs. A cough may be a sign of an illness or another medical condition. ?An acute cough may only last 2-3 weeks, while a chronic cough may last 8 or more weeks. ?Many things can cause a cough. They include: ?Germs (viruses or bacteria) that attack the airway. ?Breathing in things that bother (irritate) your lungs. ?Allergies. ?Asthma. ?Mucus that runs down the back of your throat (postnasal drip). ?Smoking. ?Acid backing up from the stomach into the tube that moves food from the mouth to the stomach (gastroesophageal reflux). ?Some medicines. ?Lung problems. ?Other medical conditions, such as heart failure or a blood clot in the lung (pulmonary embolism). ?Follow these instructions at home: ?Medicines ?Take over-the-counter and prescription medicines only as told by your doctor. ?Talk with your doctor before you take medicines that stop a cough (cough suppressants). ?Lifestyle ? ?Do not smoke, and try not to be around smoke. Do not use any products that contain nicotine or tobacco, such as cigarettes, e-cigarettes, and chewing tobacco. If you need help quitting, ask your doctor. ?Drink enough fluid to keep your pee (urine) pale yellow. ?Avoid caffeine. ?Do not drink alcohol if your doctor tells you not to drink. ?General instructions ? ?Watch for any changes in your cough. Tell your doctor about them. ?Always cover your mouth when you cough. ?Stay away from things that make you cough, such as perfume, candles, campfire smoke, or cleaning products. ?If the air is dry, use a cool mist vaporizer or humidifier in your home. ?If your cough is worse at night, try using extra pillows to raise your head up higher while you sleep. ?Rest as needed. ?Keep all follow-up visits as told by your doctor. This is important. ?Contact a doctor if: ?You have new symptoms. ?You cough up pus. ?Your cough does not get better  after 2-3 weeks, or your cough gets worse. ?Cough medicine does not help your cough and you are not sleeping well. ?You have pain that gets worse or pain that is not helped with medicine. ?You have a fever. ?You are losing weight and you do not know why. ?You have night sweats. ?Get help right away if: ?You cough up blood. ?You have trouble breathing. ?Your heartbeat is very fast. ?These symptoms may be an emergency. Do not wait to see if the symptoms will go away. Get medical help right away. Call your local emergency services (911 in the [place] Do not drive yourself to the hospital. ?Summary ?A cough helps to clear your throat and lungs. Many things can cause a cough. ?Take over-the-counter and prescription medicines only as told by your doctor. ?Always cover your mouth when you cough. ?Contact a doctor if you have new symptoms or you have a cough that does not get better or gets worse. ?This information is not intended to replace advice given to you by your health care provider. Make sure you discuss any questions you have with your health care provider. ?Document Revised: 01/01/2020 Document Reviewed: 12/01/2018 ?Elsevier Patient Education ? 2022 Elsevier Inc. ? ?

## 2022-02-13 ENCOUNTER — Other Ambulatory Visit: Payer: Self-pay

## 2022-02-13 ENCOUNTER — Encounter: Payer: Self-pay | Admitting: Nurse Practitioner

## 2022-02-13 ENCOUNTER — Ambulatory Visit
Admission: RE | Admit: 2022-02-13 | Discharge: 2022-02-13 | Disposition: A | Discharge status: Home / Self Care | Payer: 59 | Source: Ambulatory Visit | Attending: Nurse Practitioner | Admitting: Nurse Practitioner

## 2022-02-13 ENCOUNTER — Ambulatory Visit (INDEPENDENT_AMBULATORY_CARE_PROVIDER_SITE_OTHER): Payer: PRIVATE HEALTH INSURANCE | Admitting: Nurse Practitioner

## 2022-02-13 DIAGNOSIS — R051 Acute cough: Secondary | ICD-10-CM | POA: Diagnosis not present

## 2022-02-13 DIAGNOSIS — Z1231 Encounter for screening mammogram for malignant neoplasm of breast: Secondary | ICD-10-CM | POA: Diagnosis present

## 2022-02-13 MED ORDER — AMOXICILLIN-POT CLAVULANATE 875-125 MG PO TABS: 1.0000 | ORAL_TABLET | Freq: Two times a day (BID) | ORAL | 0 refills | Status: AC

## 2022-02-13 MED ORDER — PREDNISONE 20 MG PO TABS: 40.0000 mg | ORAL_TABLET | Freq: Every day | ORAL | 0 refills | Status: AC

## 2022-02-13 MED ORDER — HYDROCOD POLI-CHLORPHE POLI ER 10-8 MG/5ML PO SUER: 5.0000 mL | Freq: Two times a day (BID) | ORAL | 0 refills | Status: DC | PRN

## 2022-02-13 NOTE — Assessment & Plan Note (Addendum)
States cough has been going on for 2 weeks. Allergy and cough medication did not provide relief. Cough causes chest pain and disrupts sleep. Ordered Augmentin Bid for 7 days, Prednisone 40 mg daily for 7 days and Tussionex Pennkinetic prn for cough. Return in 2 weeks.  Consider CXR if ongoing. ?

## 2022-02-13 NOTE — Progress Notes (Signed)
? ?BP 113/73   Pulse 82   Temp 99 ?F (37.2 ?C) (Oral)   Ht '4\' 10"'  (1.473 m)   Wt 118 lb (53.5 kg)   LMP  (LMP Unknown)   SpO2 98%   BMI 24.66 kg/m?   ? ?Subjective:  ? ? Patient ID: Katherine [name], female    DOB: 1956-09-15, 66 y.o.   MRN: [id number] ? ?HPI: ?Katherine [name] is a 66 y.o. female ? ?Chief Complaint  ?Patient presents with  ? Cough  ?  Patient states she has had a cough for 2 weeks. Patient states she has tried over the counter medications like cough medicine and Mucinex. Patient states the over the counter medications helped a little bit. Patient states she coughs up green phlegm. Patient states she coughs more in the night and it get worse.  ? Medication Management  ?  Patient states when she takes the [place] she notices that it makes her cough even more.   ? ?NOTE WRITTEN BY [employer] DNP STUDENT.  ASSESSMENT AND PLAN OF CARE REVIEWED WITH STUDENT, AGREE WITH ABOVE FINDINGS AND PLAN.  ? ?UPPER RESPIRATORY TRACT INFECTION ?Worst symptom:Cough ?Fever: no ?Cough: yes ?Shortness of breath: yes sometimes ?Wheezing: yes ?Chest pain: yes, with cough states pain radiates to right leg ?Chest tightness: no ?Chest congestion: yes ?Nasal congestion: yes intermittent ?Runny nose: yes ?Post nasal drip: yes ?Sneezing: yes sometimes ?Sore throat: no ?Swollen glands: yes ?Sinus pressure: no ?Headache: no ?Face pain: no ?Toothache: no ?Ear pain: no bilateral ?Ear pressure: no bilateral ?Eyes red/itching:yes ?Eye drainage/crusting: yes  ?Vomiting: no ?Rash: no ?Fatigue: no ?Sick contacts: no ?Strep contacts: no  ?Context: worse ?Recurrent sinusitis: no ?Relief with OTC cold/cough medications: yes  ?Treatments attempted: cough syrup, helps a little bit ? ?Relevant past medical, surgical, family and social history reviewed and updated as indicated. Interim medical history since our last visit reviewed. ?Allergies and medications reviewed and updated. ? ?Review of Systems  ?Constitutional:  Negative for chills,  fatigue and fever.  ?HENT:  Positive for congestion and postnasal drip. Negative for rhinorrhea, sinus pressure, sinus pain, sore throat and trouble swallowing.   ?Eyes:  Positive for discharge and itching. Negative for photophobia, pain and redness.  ?Respiratory:  Positive for cough, chest tightness and shortness of breath. Negative for wheezing.   ?Cardiovascular:  Negative for chest pain and palpitations.  ?Gastrointestinal:  Negative for diarrhea, nausea and vomiting.  ?Endocrine: Negative for cold intolerance and heat intolerance.  ?Musculoskeletal:  Negative for arthralgias, myalgias and neck stiffness.  ?Skin: Negative.   ?Neurological:  Negative for dizziness, light-headedness and headaches.  ?Hematological:  Positive for adenopathy.  ?Psychiatric/Behavioral: Negative.    ? ?Per HPI unless specifically indicated above ? ?   ?Objective:  ?  ?BP 113/73   Pulse 82   Temp 99 ?F (37.2 ?C) (Oral)   Ht '4\' 10"'  (1.473 m)   Wt 118 lb (53.5 kg)   LMP  (LMP Unknown)   SpO2 98%   BMI 24.66 kg/m?   ?Wt Readings from Last 3 Encounters:  ?02/13/22 118 lb (53.5 kg)  ?01/19/22 114 lb 12.8 oz (52.1 kg)  ?01/05/22 114 lb (51.7 kg)  ?  ?Physical Exam ?Constitutional:   ?   General: She is not in acute distress. ?   Appearance: Normal appearance. She is well-groomed and normal weight. She is not ill-appearing.  ?HENT:  ?   Head: Normocephalic and atraumatic.  ?   Salivary Glands: Right salivary gland is not  diffusely enlarged or tender. Left salivary gland is not diffusely enlarged or tender.  ?   Right Ear: Tympanic membrane, ear canal and external ear normal.  ?   Left Ear: Tympanic membrane, ear canal and external ear normal.  ?   Nose: Nose normal. No congestion or rhinorrhea.  ?   Mouth/Throat:  ?   Mouth: Mucous membranes are moist.  ?   Pharynx: Oropharynx is clear.  ?Eyes:  ?   General: Lids are normal. No scleral icterus.    ?   Right eye: No discharge.     ?   Left eye: No discharge.  ?   Conjunctiva/sclera:  Conjunctivae normal.  ?Neck:  ?   Thyroid: No thyroid tenderness.  ?   Vascular: No carotid bruit.  ?Cardiovascular:  ?   Rate and Rhythm: Normal rate and regular rhythm.  ?   Pulses: Normal pulses.  ?   Heart sounds: Normal heart sounds. No murmur heard. ?  No gallop.  ?Pulmonary:  ?   Effort: Pulmonary effort is normal. No accessory muscle usage or respiratory distress.  ?   Breath sounds: Normal breath sounds. No wheezing or rhonchi.  ?Musculoskeletal:  ?   Cervical back: Normal range of motion. No edema.  ?Lymphadenopathy:  ?   Head:  ?   Right side of head: No submental, submandibular, tonsillar, preauricular, posterior auricular or occipital adenopathy.  ?   Left side of head: No submental, submandibular, tonsillar, preauricular, posterior auricular or occipital adenopathy.  ?   Cervical: No cervical adenopathy.  ?   Right cervical: No superficial, deep or posterior cervical adenopathy. ?   Left cervical: No superficial, deep or posterior cervical adenopathy.  ?   Upper Body:  ?   Right upper body: No supraclavicular adenopathy.  ?   Left upper body: No supraclavicular adenopathy.  ?Skin: ?   General: Skin is warm and dry.  ?Neurological:  ?   Mental Status: She is alert and oriented to person, place, and time.  ?   Gait: Gait is intact.  ?Psychiatric:     ?   Attention and Perception: Attention normal.     ?   Mood and Affect: Mood normal. Mood is not anxious or depressed.     ?   Speech: Speech normal.     ?   Behavior: Behavior normal. Behavior is cooperative.     ?   Thought Content: Thought content normal.     ?   Judgment: Judgment normal.  ? ? ?Results for orders placed or performed in visit on 01/05/22  ?WET PREP FOR [place], YEAST, CLUE  ? Specimen: Sterile Swab  ? Sterile Swab  ?Result Value Ref Range  ? Trichomonas Exam Negative Negative  ? Yeast Exam Negative Negative  ? Clue Cell Exam Negative Negative  ?T4, free  ?Result Value Ref Range  ? Free T4 1.09 0.82 - 1.77 ng/dL  ?TSH  ?Result Value Ref  Range  ? TSH 2.270 0.450 - 4.500 uIU/mL  ?Thyroid peroxidase antibody  ?Result Value Ref Range  ? Thyroperoxidase Ab SerPl-aCnc 13 0 - 34 IU/mL  ?CBC with Differential/Platelet  ?Result Value Ref Range  ? WBC 8.9 3.4 - 10.8 x10E3/uL  ? RBC 5.18 3.77 - 5.28 x10E6/uL  ? Hemoglobin 13.2 11.1 - 15.9 g/dL  ? Hematocrit 41.3 34.0 - 46.6 %  ? MCV 80 79 - 97 fL  ? MCH 25.5 (L) 26.6 - 33.0 pg  ? MCHC 32.0 31.5 -  35.7 g/dL  ? RDW 14.7 11.7 - 15.4 %  ? Platelets 252 150 - 450 x10E3/uL  ? Neutrophils 61 Not Estab. %  ? Lymphs 27 Not Estab. %  ? Monocytes 9 Not Estab. %  ? Eos 2 Not Estab. %  ? Basos 1 Not Estab. %  ? Neutrophils Absolute 5.5 1.4 - 7.0 x10E3/uL  ? Lymphocytes Absolute 2.4 0.7 - 3.1 x10E3/uL  ? Monocytes Absolute 0.8 0.1 - 0.9 x10E3/uL  ? EOS (ABSOLUTE) 0.2 0.0 - 0.4 x10E3/uL  ? Basophils Absolute 0.1 0.0 - 0.2 x10E3/uL  ? Immature Granulocytes 0 Not Estab. %  ? Immature Grans (Abs) 0.0 0.0 - 0.1 x10E3/uL  ?Comprehensive metabolic panel  ?Result Value Ref Range  ? Glucose 95 70 - 99 mg/dL  ? BUN 14 8 - 27 mg/dL  ? Creatinine, Ser 0.57 0.57 - 1.00 mg/dL  ? eGFR 102 >59 mL/min/1.73  ? BUN/Creatinine Ratio 25 12 - 28  ? Sodium 140 134 - 144 mmol/L  ? Potassium 4.2 3.5 - 5.2 mmol/L  ? Chloride 103 96 - 106 mmol/L  ? CO2 26 20 - 29 mmol/L  ? Calcium 9.3 8.7 - 10.3 mg/dL  ? Total Protein 7.9 6.0 - 8.5 g/dL  ? Albumin 4.1 3.8 - 4.8 g/dL  ? Globulin, Total 3.8 1.5 - 4.5 g/dL  ? Albumin/Globulin Ratio 1.1 (L) 1.2 - 2.2  ? Bilirubin Total 0.3 0.0 - 1.2 mg/dL  ? Alkaline Phosphatase 83 44 - 121 IU/L  ? AST 23 0 - 40 IU/L  ? ALT 23 0 - 32 IU/L  ?Lipid Panel w/o Chol/HDL Ratio  ?Result Value Ref Range  ? Cholesterol, Total 119 100 - 199 mg/dL  ? Triglycerides 184 (H) 0 - 149 mg/dL  ? HDL 46 >39 mg/dL  ? VLDL Cholesterol Cal 30 5 - 40 mg/dL  ? LDL Chol Calc (NIH) 43 0 - 99 mg/dL  ?VITAMIN D 25 Hydroxy (Vit-D Deficiency, Fractures)  ?Result Value Ref Range  ? Vit D, 25-Hydroxy 35.3 30.0 - 100.0 ng/mL  ?Vitamin B12  ?Result  Value Ref Range  ? Vitamin B-12 607 232 - 1,245 pg/mL  ?Urinalysis, Routine w reflex microscopic  ?Result Value Ref Range  ? Specific Gravity, UA 1.010 1.005 - 1.030  ? pH, UA 5.5 5.0 - 7.5  ? Color, UA Yellow Yellow  ? Suann Larry

## 2022-02-16 ENCOUNTER — Other Ambulatory Visit: Payer: Self-pay | Admitting: *Deleted

## 2022-02-16 ENCOUNTER — Ambulatory Visit: Payer: PRIVATE HEALTH INSURANCE | Admitting: Nurse Practitioner

## 2022-02-16 ENCOUNTER — Inpatient Hospital Stay
Admission: RE | Admit: 2022-02-16 | Discharge: 2022-02-16 | Disposition: A | Discharge status: Home / Self Care | Payer: Self-pay | Source: Ambulatory Visit | Attending: *Deleted | Admitting: *Deleted

## 2022-02-16 ENCOUNTER — Other Ambulatory Visit: Payer: Self-pay | Admitting: Nurse Practitioner

## 2022-02-16 DIAGNOSIS — Z1231 Encounter for screening mammogram for malignant neoplasm of breast: Secondary | ICD-10-CM

## 2022-02-16 DIAGNOSIS — R928 Other abnormal and inconclusive findings on diagnostic imaging of breast: Secondary | ICD-10-CM

## 2022-02-16 DIAGNOSIS — N6489 Other specified disorders of breast: Secondary | ICD-10-CM

## 2022-02-16 NOTE — Progress Notes (Signed)
Please let Katherine [name] know her mammogram did return showing need for repeat imaging on the left side do to some asymmetry (unequal) tissue.  This is very common to require repeat imaging and will most likely return with normal findings, but is necessary to check.  The breast center will reach out to discuss with her and schedule.

## 2022-02-24 NOTE — Patient Instructions (Addendum)
Change to Mucinex over the counter for cough + start Claritin 10 MG daily + use [place] eye drops. ? ?CALL: Please call to schedule your mammogram and/or bone density: ?[facility] at [facility]  ?Address: [address], [place], [place] [address] ?Phone: [phone]  ? ?Cough, Adult ?A cough helps to clear your throat and lungs. A cough may be a sign of an illness or another medical condition. ?An acute cough may only last 2-3 weeks, while a chronic cough may last 8 or more weeks. ?Many things can cause a cough. They include: ?Germs (viruses or bacteria) that attack the airway. ?Breathing in things that bother (irritate) your lungs. ?Allergies. ?Asthma. ?Mucus that runs down the back of your throat (postnasal drip). ?Smoking. ?Acid backing up from the stomach into the tube that moves food from the mouth to the stomach (gastroesophageal reflux). ?Some medicines. ?Lung problems. ?Other medical conditions, such as heart failure or a blood clot in the lung (pulmonary embolism). ?Follow these instructions at home: ?Medicines ?Take over-the-counter and prescription medicines only as told by your doctor. ?Talk with your doctor before you take medicines that stop a cough (cough suppressants). ?Lifestyle ? ?Do not smoke, and try not to be around smoke. Do not use any products that contain nicotine or tobacco, such as cigarettes, e-cigarettes, and chewing tobacco. If you need help quitting, ask your doctor. ?Drink enough fluid to keep your pee (urine) pale yellow. ?Avoid caffeine. ?Do not drink alcohol if your doctor tells you not to drink. ?General instructions ? ?Watch for any changes in your cough. Tell your doctor about them. ?Always cover your mouth when you cough. ?Stay away from things that make you cough, such as perfume, candles, campfire smoke, or cleaning products. ?If the air is dry, use a cool mist vaporizer or humidifier in your home. ?If your cough is worse at night, try using  extra pillows to raise your head up higher while you sleep. ?Rest as needed. ?Keep all follow-up visits as told by your doctor. This is important. ?Contact a doctor if: ?You have new symptoms. ?You cough up pus. ?Your cough does not get better after 2-3 weeks, or your cough gets worse. ?Cough medicine does not help your cough and you are not sleeping well. ?You have pain that gets worse or pain that is not helped with medicine. ?You have a fever. ?You are losing weight and you do not know why. ?You have night sweats. ?Get help right away if: ?You cough up blood. ?You have trouble breathing. ?Your heartbeat is very fast. ?These symptoms may be an emergency. Do not wait to see if the symptoms will go away. Get medical help right away. Call your local emergency services (911 in the [place] Do not drive yourself to the hospital. ?Summary ?A cough helps to clear your throat and lungs. Many things can cause a cough. ?Take over-the-counter and prescription medicines only as told by your doctor. ?Always cover your mouth when you cough. ?Contact a doctor if you have new symptoms or you have a cough that does not get better or gets worse. ?This information is not intended to replace advice given to you by your health care provider. Make sure you discuss any questions you have with your health care provider. ?Document Revised: 01/01/2020 Document Reviewed: 12/01/2018 ?Elsevier Patient Education ? 2022 [employer]. ? ?

## 2022-02-27 ENCOUNTER — Ambulatory Visit (INDEPENDENT_AMBULATORY_CARE_PROVIDER_SITE_OTHER): Payer: PRIVATE HEALTH INSURANCE | Admitting: Nurse Practitioner

## 2022-02-27 ENCOUNTER — Encounter: Payer: Self-pay | Admitting: Nurse Practitioner

## 2022-02-27 VITALS — BP 124/75 | HR 84 | Temp 98.1°F | Ht <= 58 in | Wt 117.2 lb

## 2022-02-27 DIAGNOSIS — J301 Allergic rhinitis due to pollen: Secondary | ICD-10-CM

## 2022-02-27 DIAGNOSIS — R928 Other abnormal and inconclusive findings on diagnostic imaging of breast: Secondary | ICD-10-CM | POA: Diagnosis not present

## 2022-02-27 DIAGNOSIS — R051 Acute cough: Secondary | ICD-10-CM | POA: Diagnosis not present

## 2022-02-27 DIAGNOSIS — J309 Allergic rhinitis, unspecified: Secondary | ICD-10-CM | POA: Insufficient documentation

## 2022-02-27 DIAGNOSIS — T7840XA Allergy, unspecified, initial encounter: Secondary | ICD-10-CM | POA: Insufficient documentation

## 2022-02-27 MED ORDER — LORATADINE 10 MG PO TABS: 10.0000 mg | ORAL_TABLET | Freq: Every day | ORAL | 11 refills | Status: DC

## 2022-02-27 MED ORDER — OLOPATADINE HCL 0.1 % OP SOLN: 1.0000 [drp] | Freq: Two times a day (BID) | OPHTHALMIC | 12 refills | Status: DC

## 2022-02-27 MED ORDER — HYDROCOD POLI-CHLORPHE POLI ER 10-8 MG/5ML PO SUER: 5.0000 mL | Freq: Two times a day (BID) | ORAL | 0 refills | Status: DC | PRN

## 2022-02-27 NOTE — Assessment & Plan Note (Signed)
?  Acute and improving.  Reports ongoing allergy symptoms, but cough improving.  Would like refill on Tussionex for sleep and cough in evening, will refill today but is aware no further refills as meant for short term use only.  Scripts for Claritin daily and Pataday sent, suspect allergies triggered initial cough issues.  Consider CXR if ongoing. ?

## 2022-02-27 NOTE — Progress Notes (Signed)
Noted and spoke to patient at visit today. ?

## 2022-02-27 NOTE — Progress Notes (Signed)
? ?BP 124/75   Pulse 84   Temp 98.1 ?F (36.7 ?C) (Oral)   Ht '4\' 10"'  (1.473 m)   Wt 117 lb 3.2 oz (53.2 kg)   LMP  (LMP Unknown)   SpO2 98%   BMI 24.49 kg/m?   ? ?Subjective:  ? ? Patient ID: Katherine [name], female    DOB: August 18, 1956, 66 y.o.   MRN: [id number] ? ?HPI: ?Katherine [name] is a 66 y.o. female ? ?Chief Complaint  ?Patient presents with  ? Cough  ?  Patient is here to follow up on Cough. Patient states that her cough is getting better. Patient states the cough medication is helping and requesting a refill on the medication. Patient states she would like to discuss with provider if her taking the cough medication interferes with any of her current medications.   ? ?Discussed recent mammogram and that there is need for repeat imaging to left side, reports she was not aware of this.  She will call [facility] to schedule this.  ? ?COUGH ?Follow-up for cough today, treated with Augmentin and Prednisone at visit on 02/13/22.  She reports some improvement, continues to have tickle in throat.  Also having itchy eyes and lots of tearing.  Would like refill on Tussionex. ?Duration: weeks ?Circumstances of initial development of cough: URI ?Cough severity: mild ?Cough description: non-productive ?Aggravating factors:  worse at night ?Alleviating factors: abx, Prednisone, cough medication ?Status:  better ?Treatments attempted: as above ?Wheezing: no ?Shortness of breath: no ?Chest pain: no ?Chest tightness:no ?Nasal congestion: yes ?Runny nose: yes ?Postnasal drip: yes ?Frequent throat clearing or swallowing: yes ?Hemoptysis: no ?Fevers: no ?Night sweats: no ?Weight loss: no ?Heartburn: no ?Recent foreign travel: no ?Tuberculosis contacts: no  ? ?Relevant past medical, surgical, family and social history reviewed and updated as indicated. Interim medical history since our last visit reviewed. ?Allergies and medications reviewed and updated. ? ?Review of Systems  ?Constitutional:  Negative for activity change,  appetite change, chills, fatigue, fever and unexpected weight change.  ?HENT:  Positive for congestion and postnasal drip. Negative for ear discharge, ear pain, rhinorrhea, sinus pressure, sinus pain, sore throat and trouble swallowing.   ?Eyes:  Positive for itching. Negative for photophobia, pain, discharge and redness.  ?Respiratory:  Negative for cough, chest tightness, shortness of breath and wheezing.   ?Cardiovascular:  Negative for chest pain and palpitations.  ?Gastrointestinal: Negative.  Negative for diarrhea, nausea and vomiting.  ?Neurological: Negative.   ?Hematological:  Negative for adenopathy.  ?Psychiatric/Behavioral: Negative.    ? ?Per HPI unless specifically indicated above ? ?   ?Objective:  ?  ?BP 124/75   Pulse 84   Temp 98.1 ?F (36.7 ?C) (Oral)   Ht '4\' 10"'  (1.473 m)   Wt 117 lb 3.2 oz (53.2 kg)   LMP  (LMP Unknown)   SpO2 98%   BMI 24.49 kg/m?   ?Wt Readings from Last 3 Encounters:  ?02/27/22 117 lb 3.2 oz (53.2 kg)  ?02/13/22 118 lb (53.5 kg)  ?01/19/22 114 lb 12.8 oz (52.1 kg)  ?  ?Physical Exam ?Vitals and nursing note reviewed.  ?Constitutional:   ?   General: She is awake. She is not in acute distress. ?   Appearance: She is well-developed and well-groomed. She is not ill-appearing or toxic-appearing.  ?HENT:  ?   Head: Normocephalic.  ?   Right Ear: Hearing, tympanic membrane, ear canal and external ear normal.  ?   Left Ear: Hearing, tympanic membrane, ear canal  and external ear normal.  ?   Nose: Congestion and rhinorrhea present. Rhinorrhea is clear.  ?   Right Turbinates: Not pale.  ?   Left Turbinates: Not pale.  ?   Right Sinus: No maxillary sinus tenderness or frontal sinus tenderness.  ?   Left Sinus: No maxillary sinus tenderness or frontal sinus tenderness.  ?   Mouth/Throat:  ?   Mouth: Mucous membranes are moist.  ?   Pharynx: Posterior oropharyngeal erythema (mild with cobblestone appearance) present. No pharyngeal swelling or oropharyngeal exudate.  ?Eyes:  ?    General: Lids are normal. No allergic shiner.    ?   Right eye: No discharge.     ?   Left eye: No discharge.  ?   Conjunctiva/sclera: Conjunctivae normal.  ?   Pupils: Pupils are equal, round, and reactive to light.  ?Neck:  ?   Thyroid: No thyromegaly.  ?   Vascular: No carotid bruit.  ?Cardiovascular:  ?   Rate and Rhythm: Normal rate and regular rhythm.  ?   Heart sounds: Normal heart sounds. No murmur heard. ?  No gallop.  ?Pulmonary:  ?   Effort: Pulmonary effort is normal. No accessory muscle usage or respiratory distress.  ?   Breath sounds: Normal breath sounds.  ?Abdominal:  ?   General: Bowel sounds are normal.  ?   Palpations: Abdomen is soft. There is no hepatomegaly or splenomegaly.  ?Musculoskeletal:  ?   Cervical back: Normal range of motion and neck supple.  ?   Right lower leg: No edema.  ?   Left lower leg: No edema.  ?Lymphadenopathy:  ?   Cervical: No cervical adenopathy.  ?Skin: ?   General: Skin is warm and dry.  ?Neurological:  ?   Mental Status: She is alert and oriented to person, place, and time.  ?Psychiatric:     ?   Attention and Perception: Attention normal.     ?   Mood and Affect: Mood normal.     ?   Speech: Speech normal.     ?   Behavior: Behavior normal. Behavior is cooperative.     ?   Thought Content: Thought content normal.  ? ?Results for orders placed or performed in visit on 01/05/22  ?WET PREP FOR [place], YEAST, CLUE  ? Specimen: Sterile Swab  ? Sterile Swab  ?Result Value Ref Range  ? Trichomonas Exam Negative Negative  ? Yeast Exam Negative Negative  ? Clue Cell Exam Negative Negative  ?T4, free  ?Result Value Ref Range  ? Free T4 1.09 0.82 - 1.77 ng/dL  ?TSH  ?Result Value Ref Range  ? TSH 2.270 0.450 - 4.500 uIU/mL  ?Thyroid peroxidase antibody  ?Result Value Ref Range  ? Thyroperoxidase Ab SerPl-aCnc 13 0 - 34 IU/mL  ?CBC with Differential/Platelet  ?Result Value Ref Range  ? WBC 8.9 3.4 - 10.8 x10E3/uL  ? RBC 5.18 3.77 - 5.28 x10E6/uL  ? Hemoglobin 13.2 11.1 - 15.9  g/dL  ? Hematocrit 41.3 34.0 - 46.6 %  ? MCV 80 79 - 97 fL  ? MCH 25.5 (L) 26.6 - 33.0 pg  ? MCHC 32.0 31.5 - 35.7 g/dL  ? RDW 14.7 11.7 - 15.4 %  ? Platelets 252 150 - 450 x10E3/uL  ? Neutrophils 61 Not Estab. %  ? Lymphs 27 Not Estab. %  ? Monocytes 9 Not Estab. %  ? Eos 2 Not Estab. %  ? Basos 1 Not Estab. %  ?  Neutrophils Absolute 5.5 1.4 - 7.0 x10E3/uL  ? Lymphocytes Absolute 2.4 0.7 - 3.1 x10E3/uL  ? Monocytes Absolute 0.8 0.1 - 0.9 x10E3/uL  ? EOS (ABSOLUTE) 0.2 0.0 - 0.4 x10E3/uL  ? Basophils Absolute 0.1 0.0 - 0.2 x10E3/uL  ? Immature Granulocytes 0 Not Estab. %  ? Immature Grans (Abs) 0.0 0.0 - 0.1 x10E3/uL  ?Comprehensive metabolic panel  ?Result Value Ref Range  ? Glucose 95 70 - 99 mg/dL  ? BUN 14 8 - 27 mg/dL  ? Creatinine, Ser 0.57 0.57 - 1.00 mg/dL  ? eGFR 102 >59 mL/min/1.73  ? BUN/Creatinine Ratio 25 12 - 28  ? Sodium 140 134 - 144 mmol/L  ? Potassium 4.2 3.5 - 5.2 mmol/L  ? Chloride 103 96 - 106 mmol/L  ? CO2 26 20 - 29 mmol/L  ? Calcium 9.3 8.7 - 10.3 mg/dL  ? Total Protein 7.9 6.0 - 8.5 g/dL  ? Albumin 4.1 3.8 - 4.8 g/dL  ? Globulin, Total 3.8 1.5 - 4.5 g/dL  ? Albumin/Globulin Ratio 1.1 (L) 1.2 - 2.2  ? Bilirubin Total 0.3 0.0 - 1.2 mg/dL  ? Alkaline Phosphatase 83 44 - 121 IU/L  ? AST 23 0 - 40 IU/L  ? ALT 23 0 - 32 IU/L  ?Lipid Panel w/o Chol/HDL Ratio  ?Result Value Ref Range  ? Cholesterol, Total 119 100 - 199 mg/dL  ? Triglycerides 184 (H) 0 - 149 mg/dL  ? HDL 46 >39 mg/dL  ? VLDL Cholesterol Cal 30 5 - 40 mg/dL  ? LDL Chol Calc (NIH) 43 0 - 99 mg/dL  ?VITAMIN D 25 Hydroxy (Vit-D Deficiency, Fractures)  ?Result Value Ref Range  ? Vit D, 25-Hydroxy 35.3 30.0 - 100.0 ng/mL  ?Vitamin B12  ?Result Value Ref Range  ? Vitamin B-12 607 232 - 1,245 pg/mL  ?Urinalysis, Routine w reflex microscopic  ?Result Value Ref Range  ? Specific Gravity, UA 1.010 1.005 - 1.030  ? pH, UA 5.5 5.0 - 7.5  ? Color, UA Yellow Yellow  ? Appearance Ur Clear Clear  ? Leukocytes,UA Negative Negative  ? Protein,UA  Negative Negative/Trace  ? Glucose, UA Negative Negative  ? Ketones, UA Negative Negative  ? RBC, UA Negative Negative  ? Bilirubin, UA Negative Negative  ? Urobilinogen, Ur 0.2 0.2 - 1.0 mg/dL  ? Nitrite, UA Negative Negative

## 2022-02-27 NOTE — Assessment & Plan Note (Signed)
Triggered by current pollen with underlying asthma.  Start Claritin 10 MG daily and Pataday for eye irritation.  Albuterol as needed for cough.  Return as scheduled in July.  If worsening consider allergy testing. ?

## 2022-03-30 ENCOUNTER — Ambulatory Visit
Admission: RE | Admit: 2022-03-30 | Discharge: 2022-03-30 | Disposition: A | Discharge status: Home / Self Care | Payer: 59 | Source: Ambulatory Visit | Attending: Nurse Practitioner | Admitting: Nurse Practitioner

## 2022-03-30 DIAGNOSIS — R928 Other abnormal and inconclusive findings on diagnostic imaging of breast: Secondary | ICD-10-CM

## 2022-03-30 DIAGNOSIS — N6489 Other specified disorders of breast: Secondary | ICD-10-CM

## 2022-06-16 NOTE — Patient Instructions (Signed)

## 2022-06-18 ENCOUNTER — Ambulatory Visit (INDEPENDENT_AMBULATORY_CARE_PROVIDER_SITE_OTHER): Payer: 59 | Admitting: Nurse Practitioner

## 2022-06-18 ENCOUNTER — Encounter: Payer: Self-pay | Admitting: Nurse Practitioner

## 2022-06-18 VITALS — BP 124/74 | HR 69 | Temp 98.4°F | Ht <= 58 in | Wt 115.6 lb

## 2022-06-18 DIAGNOSIS — R42 Dizziness and giddiness: Secondary | ICD-10-CM | POA: Insufficient documentation

## 2022-06-18 DIAGNOSIS — E782 Mixed hyperlipidemia: Secondary | ICD-10-CM

## 2022-06-18 DIAGNOSIS — E039 Hypothyroidism, unspecified: Secondary | ICD-10-CM

## 2022-06-18 DIAGNOSIS — G8929 Other chronic pain: Secondary | ICD-10-CM | POA: Insufficient documentation

## 2022-06-18 DIAGNOSIS — J4520 Mild intermittent asthma, uncomplicated: Secondary | ICD-10-CM

## 2022-06-18 DIAGNOSIS — J301 Allergic rhinitis due to pollen: Secondary | ICD-10-CM

## 2022-06-18 DIAGNOSIS — M5441 Lumbago with sciatica, right side: Secondary | ICD-10-CM

## 2022-06-18 DIAGNOSIS — M25552 Pain in left hip: Secondary | ICD-10-CM | POA: Insufficient documentation

## 2022-06-18 DIAGNOSIS — H65193 Other acute nonsuppurative otitis media, bilateral: Secondary | ICD-10-CM

## 2022-06-18 MED ORDER — ALBUTEROL SULFATE HFA 108 (90 BASE) MCG/ACT IN AERS: 2.0000 | INHALATION_SPRAY | Freq: Four times a day (QID) | RESPIRATORY_TRACT | 5 refills | Status: DC | PRN

## 2022-06-18 MED ORDER — PREDNISONE 10 MG PO TABS: ORAL_TABLET | ORAL | 0 refills | Status: DC

## 2022-06-18 MED ORDER — GABAPENTIN 300 MG PO CAPS: 300.0000 mg | ORAL_CAPSULE | Freq: Every day | ORAL | 4 refills | Status: DC

## 2022-06-18 NOTE — Assessment & Plan Note (Signed)
Ongoing with underlying asthma.  Continue Claritin 10 MG daily and Pataday for eye irritation.  Albuterol as needed for cough.  If worsening consider allergy testing or ENT + consider Singulair.

## 2022-06-18 NOTE — Assessment & Plan Note (Signed)
Chronic, ongoing with come improvement with Gabapentin.  Recent imaging no acute issues noted.  She did not tolerate Tizanidine, even at low dosing.  At this time continue Gabapentin, but increase to 300 MG QHS.  Recommend she continue Voltaren gel at home + use TENS machine as needed and Icy/Hot patches.  If any worsening then would consider MRI and possible PT/ortho referral.

## 2022-06-18 NOTE — Assessment & Plan Note (Signed)
Chronic, ongoing, stable with minimal Albuterol use at baseline, unless pollen levels elevated.  Continue this regimen and adjust as needed.

## 2022-06-18 NOTE — Assessment & Plan Note (Signed)
Chronic, ongoing.  Continue current medication regimen and adjust as needed.  Assess thyroid labs today. 

## 2022-06-18 NOTE — Assessment & Plan Note (Signed)
Not able to trigger today in office.  Overall normal neuro exam with no red flags.  ?related to allergies and effusion bilateral ears.  Will send in Prednisone taper for hip, which may also benefit dizziness and ears. Labs today.  Return in 2 weeks, may need ENT referral.

## 2022-06-18 NOTE — Progress Notes (Signed)
BP 124/74   Pulse 69   Temp 98.4 F (36.9 C) (Oral)   Ht _0  (1.473 m)   Wt 115 lb 9.6 oz (52.4 kg)   LMP  (LMP Unknown)   SpO2 99%   BMI 24.16 kg/m    Subjective:    Patient ID: Katherine [name], female    DOB: [birth year], 66 y.o.   MRN: [id number]  HPI: Katherine [name] is a 66 y.o. female  Chief Complaint  Patient presents with   Hypothyroidism   Asthma   Hyperlipidemia   Back Pain    Patient says her back is still giving her some issues.    Hip Pain    Patient says she noticed some left hip pain about two weeks ago. Patient says sometimes she will noticed numbness down in the heel of her left foot. Patient says the pain and discomfort comes and goes.    Dizziness    Patient says she has noticed dizzy spells that occur more in the afternoon that comes and goes. Patient says she will have to just sit down for the episodes to go away.    HYPOTHYROIDISM Continues on Levothyroxine 50 MCG daily. Thyroid control status:stable Satisfied with current treatment? yes Medication side effects: no Medication compliance: good compliance Etiology of hypothyroidism: unknown Recent dose adjustment:no Fatigue: sometimes Cold intolerance: no Heat intolerance: no Weight gain: no Weight loss: no Constipation: sometimes Diarrhea/loose stools: sometimes Palpitations: no Lower extremity edema: no Anxiety/depressed mood: no   ASTHMA Continues on Albuterol as needed. She reports ongoing issues with allergies and eye discomfort to right side -- using Claritin and Pataday.  She reports this is not always helping. Asthma status: stable Satisfied with current treatment?: yes Albuterol/rescue inhaler frequency: 2 times a week Dyspnea frequency: none Wheezing frequency: none Cough frequency: none Nocturnal symptom frequency: none Limitation of activity: no Current upper respiratory symptoms: no Aerochamber/spacer use: no Visits to ER or Urgent Care in past year: no Pneumovax:  Not up to Date Influenza: Up to Date   HYPERLIPIDEMIA Currently taking Rosuvastatin 20 MG daily.   Hyperlipidemia status: good compliance Satisfied with current treatment?  yes Side effects:  no Medication compliance: good compliance Past cholesterol meds: none Supplements: none Aspirin:  no The ASCVD Risk score (Arnett DK, et al., 2019) failed to calculate for the following reasons:   The valid total cholesterol range is 130 to 320 mg/dL Chest pain:  no Coronary artery disease:  no Family history CAD:  no Family history early CAD:  no   BACK & HIP PAIN Ongoing back issues, but recent left hip pain started 2 weeks ago.  She is taking Gabapentin daily.  Had back imaging on 01/08/22 which showed no acute findings. Duration: months -- hip two weeks ago Mechanism of injury: unknown Location: Left, bilateral, and low back Onset: gradual Severity: 8/10 Quality: sharp, dull, aching, and throbbing Frequency: intermittent Radiation: L leg below the knee Aggravating factors: lifting, movement, and walking Alleviating factors: Voltaren and Gabapentin Status: fluctuating Treatments attempted: rest, creams, Gabapentin  Relief with NSAIDs?: No NSAIDs Taken Nighttime pain:   occasional Paresthesias / decreased sensation:  no Bowel / bladder incontinence:  no Fevers:  no Dysuria / urinary frequency:  no   DIZZINESS Started a few weeks ago, mostly at night time. When stands up will feel dizzy and have to sit down for awhile and feel better. Duration: weeks Description of symptoms: room spinning Duration of episode: seconds Dizziness frequency: no history of the  same Provoking factors:  standing up Aggravating factors:  standing up Triggered by rolling over in bed:  sometimes Triggered by bending over:  sometimes Aggravated by head movement: yes Aggravated by exertion, coughing, loud noises: no Recent head injury: no Recent or current viral symptoms: no History of vasovagal  episodes: no Nausea: no Vomiting: no Tinnitus: no Hearing loss: no Aural fullness: no Headache: no Photophobia/phonophobia: no Unsteady gait: no Postural instability: no Diplopia, dysarthria, dysphagia or weakness: no Related to exertion: no Pallor: no Diaphoresis: no Dyspnea: no Chest pain: no   Relevant past medical, surgical, family and social history reviewed and updated as indicated. Interim medical history since our last visit reviewed. Allergies and medications reviewed and updated.  Review of Systems  Constitutional:  Negative for activity change, appetite change, diaphoresis, fatigue and fever.  Respiratory:  Negative for cough, chest tightness and shortness of breath.   Cardiovascular:  Negative for chest pain, palpitations and leg swelling.  Gastrointestinal: Negative.   Musculoskeletal:  Positive for arthralgias and back pain.  Neurological:  Positive for dizziness. Negative for tremors, syncope, facial asymmetry, weakness, light-headedness and headaches.  Psychiatric/Behavioral: Negative.      Per HPI unless specifically indicated above     Objective:    BP 124/74   Pulse 69   Temp 98.4 F (36.9 C) (Oral)   Ht _0  (1.473 m)   Wt 115 lb 9.6 oz (52.4 kg)   LMP  (LMP Unknown)   SpO2 99%   BMI 24.16 kg/m   Wt Readings from Last 3 Encounters:  06/18/22 115 lb 9.6 oz (52.4 kg)  02/27/22 117 lb 3.2 oz (53.2 kg)  02/13/22 118 lb (53.5 kg)    Physical Exam Vitals and nursing note reviewed.  Constitutional:      General: She is awake. She is not in acute distress.    Appearance: She is well-developed and well-groomed. She is not ill-appearing or toxic-appearing.  HENT:     Head: Normocephalic.     Right Ear: Hearing, ear canal and external ear normal. A middle ear effusion is present.     Left Ear: Hearing, ear canal and external ear normal. A middle ear effusion is present.     Nose: Nose normal. No rhinorrhea.     Right Sinus: No maxillary sinus  tenderness or frontal sinus tenderness.     Left Sinus: No maxillary sinus tenderness or frontal sinus tenderness.     Mouth/Throat:     Mouth: Mucous membranes are moist.     Pharynx: Posterior oropharyngeal erythema (mild with cobblestone pattern) present. No pharyngeal swelling or oropharyngeal exudate.  Eyes:     General: Lids are normal.        Right eye: No discharge.        Left eye: No discharge.     Conjunctiva/sclera: Conjunctivae normal.     Pupils: Pupils are equal, round, and reactive to light.  Neck:     Thyroid: No thyromegaly.     Vascular: No carotid bruit.  Cardiovascular:     Rate and Rhythm: Normal rate and regular rhythm.     Heart sounds: Normal heart sounds. No murmur heard.    No gallop.  Pulmonary:     Effort: Pulmonary effort is normal. No accessory muscle usage or respiratory distress.     Breath sounds: Normal breath sounds.  Abdominal:     General: Bowel sounds are normal.     Palpations: Abdomen is soft. There is no hepatomegaly or  splenomegaly.  Musculoskeletal:     Cervical back: Normal range of motion and neck supple.     Lumbar back: Tenderness (to SI area left side and left lateral hip) present. No swelling, edema, spasms or bony tenderness. Normal range of motion. Negative right straight leg raise test and negative left straight leg raise test.     Right hip: Normal.     Left hip: Tenderness (lateral aspect) present. No bony tenderness or crepitus. Normal range of motion. Decreased strength.     Right lower leg: No edema.     Left lower leg: No edema.  Lymphadenopathy:     Cervical: No cervical adenopathy.  Skin:    General: Skin is warm and dry.  Neurological:     Mental Status: She is alert and oriented to [name], place, and time.     Cranial Nerves: Cranial nerves 2-12 are intact.     Motor: Motor function is intact.     Coordination: Coordination is intact.     Gait: Gait is intact.     Deep Tendon Reflexes: Reflexes are normal and  symmetric.     Reflex Scores:      Brachioradialis reflexes are 2+ on the right side and 2+ on the left side.      Patellar reflexes are 2+ on the right side and 2+ on the left side. Psychiatric:        Attention and Perception: Attention normal.        Mood and Affect: Mood normal.        Speech: Speech normal.        Behavior: Behavior normal. Behavior is cooperative.        Thought Content: Thought content normal.    Results for orders placed or performed in visit on 01/05/22  WET PREP FOR [place], YEAST, CLUE   Specimen: Sterile Swab   Sterile Swab  Result Value Ref Range   Trichomonas Exam Negative Negative   Yeast Exam Negative Negative   Clue Cell Exam Negative Negative  T4, free  Result Value Ref Range   Free T4 1.09 0.82 - 1.77 ng/dL  TSH  Result Value Ref Range   TSH 2.270 0.450 - 4.500 uIU/mL  Thyroid peroxidase antibody  Result Value Ref Range   Thyroperoxidase Ab SerPl-aCnc 13 0 - 34 IU/mL  CBC with Differential/Platelet  Result Value Ref Range   WBC 8.9 3.4 - 10.8 x10E3/uL   RBC 5.18 3.77 - 5.28 x10E6/uL   Hemoglobin 13.2 11.1 - 15.9 g/dL   Hematocrit 41.3 34.0 - 46.6 %   MCV 80 79 - 97 fL   MCH 25.5 (L) 26.6 - 33.0 pg   MCHC 32.0 31.5 - 35.7 g/dL   RDW 14.7 11.7 - 15.4 %   Platelets 252 150 - 450 x10E3/uL   Neutrophils 61 Not Estab. %   Lymphs 27 Not Estab. %   Monocytes 9 Not Estab. %   Eos 2 Not Estab. %   Basos 1 Not Estab. %   Neutrophils Absolute 5.5 1.4 - 7.0 x10E3/uL   Lymphocytes Absolute 2.4 0.7 - 3.1 x10E3/uL   Monocytes Absolute 0.8 0.1 - 0.9 x10E3/uL   EOS (ABSOLUTE) 0.2 0.0 - 0.4 x10E3/uL   Basophils Absolute 0.1 0.0 - 0.2 x10E3/uL   Immature Granulocytes 0 Not Estab. %   Immature Grans (Abs) 0.0 0.0 - 0.1 x10E3/uL  Comprehensive metabolic panel  Result Value Ref Range   Glucose 95 70 - 99 mg/dL   BUN 14  8 - 27 mg/dL   Creatinine, Ser 0.57 0.57 - 1.00 mg/dL   eGFR 102 >59 mL/min/1.73   BUN/Creatinine Ratio 25 12 - 28   Sodium 140  134 - 144 mmol/L   Potassium 4.2 3.5 - 5.2 mmol/L   Chloride 103 96 - 106 mmol/L   CO2 26 20 - 29 mmol/L   Calcium 9.3 8.7 - 10.3 mg/dL   Total Protein 7.9 6.0 - 8.5 g/dL   Albumin 4.1 3.8 - 4.8 g/dL   Globulin, Total 3.8 1.5 - 4.5 g/dL   Albumin/Globulin Ratio 1.1 (L) 1.2 - 2.2   Bilirubin Total 0.3 0.0 - 1.2 mg/dL   Alkaline Phosphatase 83 44 - 121 IU/L   AST 23 0 - 40 IU/L   ALT 23 0 - 32 IU/L  Lipid Panel w/o Chol/HDL Ratio  Result Value Ref Range   Cholesterol, Total 119 100 - 199 mg/dL   Triglycerides 184 (H) 0 - 149 mg/dL   HDL 46 >39 mg/dL   VLDL Cholesterol Cal 30 5 - 40 mg/dL   LDL Chol Calc (NIH) 43 0 - 99 mg/dL  VITAMIN D 25 Hydroxy (Vit-D Deficiency, Fractures)  Result Value Ref Range   Vit D, 25-Hydroxy 35.3 30.0 - 100.0 ng/mL  Vitamin B12  Result Value Ref Range   Vitamin B-12 607 232 - 1,245 pg/mL  Urinalysis, Routine w reflex microscopic  Result Value Ref Range   Specific Gravity, UA 1.010 1.005 - 1.030   pH, UA 5.5 5.0 - 7.5   Color, UA Yellow Yellow   Appearance Ur Clear Clear   Leukocytes,UA Negative Negative   Protein,UA Negative Negative/Trace   Glucose, UA Negative Negative   Ketones, UA Negative Negative   RBC, UA Negative Negative   Bilirubin, UA Negative Negative   Urobilinogen, Ur 0.2 0.2 - 1.0 mg/dL   Nitrite, UA Negative Negative      Assessment & Plan:   Problem List Items Addressed This Visit       Respiratory   Allergic rhinitis    Ongoing with underlying asthma.  Continue Claritin 10 MG daily and Pataday for eye irritation.  Albuterol as needed for cough.  If worsening consider allergy testing or ENT + consider Singulair.      Asthma    Chronic, ongoing, stable with minimal Albuterol use at baseline, unless pollen levels elevated.  Continue this regimen and adjust as needed.      Relevant Medications   albuterol (VENTOLIN HFA) 108 (90 Base) MCG/ACT inhaler   predniSONE (DELTASONE) 10 MG tablet     Endocrine   Hypothyroid     Chronic, ongoing.  Continue current medication regimen and adjust as needed.  Assess thyroid labs today.      Relevant Orders   TSH   T4, free     Other   Acute hip pain, left    Acute for two weeks -- ?related to back.  Will obtain left hip imaging to further assess.  At this time continue Gabapentin, but increase to 300 MG QHS.  Prednisone taper sent and educated her on this.  Recommend she continue Voltaren gel at home + use TENS machine as needed and Icy/Hot patches.  If any worsening then would consider MRI and possible PT/ortho referral.      Relevant Orders   DG Hip Unilat W OR W/O Pelvis 2-3 Views Left   Dizziness    Not able to trigger today in office.  Overall normal neuro exam with no  red flags.  ?related to allergies and effusion bilateral ears.  Will send in Prednisone taper for hip, which may also benefit dizziness and ears. Labs today.  Return in 2 weeks, may need ENT referral.      Relevant Orders   Basic metabolic panel   CBC with Differential/Platelet   Hyperlipidemia - Primary    Chronic, ongoing.  Continue current medication regimen and adjust as needed.  Labs up to date.      Low back pain    Chronic, ongoing with come improvement with Gabapentin.  Recent imaging no acute issues noted.  She did not tolerate Tizanidine, even at low dosing.  At this time continue Gabapentin, but increase to 300 MG QHS.  Recommend she continue Voltaren gel at home + use TENS machine as needed and Icy/Hot patches.  If any worsening then would consider MRI and possible PT/ortho referral.      Relevant Medications   predniSONE (DELTASONE) 10 MG tablet   Other Visit Diagnoses     Acute effusion of both middle ears       No infection present, suspect from allergies.  Prednisone sent in.        Follow up plan: Return in about 2 weeks (around 07/02/2022) for HIP PAIN AND DIZZINESS.

## 2022-06-18 NOTE — Assessment & Plan Note (Addendum)
Acute for two weeks -- ?related to back.  Will obtain left hip imaging to further assess.  At this time continue Gabapentin, but increase to 300 MG QHS.  Prednisone taper sent and educated her on this.  Recommend she continue Voltaren gel at home + use TENS machine as needed and Icy/Hot patches.  If any worsening then would consider MRI and possible PT/ortho referral.

## 2022-06-18 NOTE — Assessment & Plan Note (Signed)
Chronic, ongoing.  Continue current medication regimen and adjust as needed.  Labs up to date. 

## 2022-06-19 LAB — BASIC METABOLIC PANEL
BUN/Creatinine Ratio: 21 (ref 12–28)
BUN: 13 mg/dL (ref 8–27)
CO2: 21 mmol/L (ref 20–29)
Calcium: 9.3 mg/dL (ref 8.7–10.3)
Chloride: 105 mmol/L (ref 96–106)
Creatinine, Ser: 0.63 mg/dL (ref 0.57–1.00)
Glucose: 93 mg/dL (ref 70–99)
Potassium: 3.9 mmol/L (ref 3.5–5.2)
Sodium: 141 mmol/L (ref 134–144)
eGFR: 100 mL/min/{1.73_m2} (ref >59)

## 2022-06-19 LAB — CBC WITH DIFFERENTIAL/PLATELET
Basophils Absolute: 0.1 10*3/uL (ref 0.0–0.2)
Basos: 1 %
EOS (ABSOLUTE): 0.2 10*3/uL (ref 0.0–0.4)
Eos: 2 %
Hematocrit: 41.4 % (ref 34.0–46.6)
Hemoglobin: 13.4 g/dL (ref 11.1–15.9)
Immature Grans (Abs): 0 10*3/uL (ref 0.0–0.1)
Immature Granulocytes: 0 %
Lymphocytes Absolute: 2.3 10*3/uL (ref 0.7–3.1)
Lymphs: 30 %
MCH: 25.6 pg — ABNORMAL LOW (ref 26.6–33.0)
MCHC: 32.4 g/dL (ref 31.5–35.7)
MCV: 79 fL (ref 79–97)
Monocytes Absolute: 0.7 10*3/uL (ref 0.1–0.9)
Monocytes: 9 %
Neutrophils Absolute: 4.5 10*3/uL (ref 1.4–7.0)
Neutrophils: 58 %
Platelets: 257 10*3/uL (ref 150–450)
RBC: 5.23 x10E6/uL (ref 3.77–5.28)
RDW: 14 % (ref 11.7–15.4)
WBC: 7.8 10*3/uL (ref 3.4–10.8)

## 2022-06-19 LAB — T4, FREE: Free T4: 1.2 ng/dL (ref 0.82–1.77)

## 2022-06-19 LAB — TSH: TSH: 0.854 u[IU]/mL (ref 0.450–4.500)

## 2022-06-19 NOTE — Progress Notes (Signed)
Good afternoon, please let Katherine [name] know that her labs have returned and overall remain stable with no abnormal findings.  Kidney function remains stable.  CBC shows no anemia or infection.  Thyroid levels stable.  Can continue current thyroid dosing.  Any questions? Keep being amazing!!  Thank you for allowing me to participate in your care.  I appreciate you. Kindest regards, Josealberto Montalto

## 2022-07-01 NOTE — Patient Instructions (Signed)
Dizziness Dizziness is a common problem. It makes you feel unsteady or light-headed. You may feel like you are about to pass out (faint). Dizziness can lead to getting hurt if you stumble or fall. Dizziness can be caused by many things, including: Medicines. Not having enough water in your body (dehydration). Illness. Follow these instructions at home: Eating and drinking  Drink enough fluid to keep your pee (urine) pale yellow. This helps to keep you from getting dehydrated. Try to drink more clear fluids, such as water. Do not drink alcohol. Limit how much caffeine you drink or eat, if your doctor tells you to do that. Limit how much salt (sodium) you drink or eat, if your doctor tells you to do that. Activity  Avoid making quick movements. Stand up slowly from sitting in a chair, and steady yourself until you feel okay. In the morning, first sit up on the side of the bed. When you feel okay, stand up slowly while you hold onto something. Do this until you know that your balance is okay. If you need to stand in one place for a long time, move your legs often. Tighten and relax the muscles in your legs while you are standing. Do not drive or use machinery if you feel dizzy. Avoid bending down if you feel dizzy. Place items in your home so you can reach them easily without leaning over. Lifestyle Do not smoke or use any products that contain nicotine or tobacco. If you need help quitting, ask your doctor. Try to lower your stress level. You can do this by using methods such as yoga or meditation. Talk with your doctor if you need help. General instructions Watch your dizziness for any changes. Take over-the-counter and prescription medicines only as told by your doctor. Talk with your doctor if you think that you are dizzy because of a medicine that you are taking. Tell a friend or a family member that you are feeling dizzy. If he or she notices any changes in your behavior, have this  person call your doctor. Keep all follow-up visits. Contact a doctor if: Your dizziness does not go away. Your dizziness or light-headedness gets worse. You feel like you may vomit (are nauseous). You have trouble hearing. You have new symptoms. You are unsteady on your feet. You feel like the room is spinning. You have neck pain or a stiff neck. You have a fever. Get help right away if: You vomit or have watery poop (diarrhea), and you cannot eat or drink anything. You have trouble: Talking. Walking. Swallowing. Using your arms, hands, or legs. You feel generally weak. You are not thinking clearly, or you have trouble forming sentences. A friend or family member may notice this. You have: Chest pain. Pain in your belly (abdomen). Shortness of breath. Sweating. Your vision changes. You are bleeding. You have a very bad headache. These symptoms may be an emergency. Get help right away. Call your local emergency services (911 in the [place] Do not wait to see if the symptoms will go away. Do not drive yourself to the hospital. Summary Dizziness makes you feel unsteady or light-headed. You may feel like you are about to pass out (faint). Drink enough fluid to keep your pee (urine) pale yellow. Do not drink alcohol. Avoid making quick movements if you feel dizzy. Watch your dizziness for any changes. This information is not intended to replace advice given to you by your health care provider. Make sure you discuss any questions   you have with your health care provider. Document Revised: 10/17/2020 Document Reviewed: 10/17/2020 Elsevier Patient Education  2023 [employer]  

## 2022-07-05 ENCOUNTER — Encounter: Payer: Self-pay | Admitting: Nurse Practitioner

## 2022-07-05 ENCOUNTER — Ambulatory Visit (INDEPENDENT_AMBULATORY_CARE_PROVIDER_SITE_OTHER): Payer: 59 | Admitting: Nurse Practitioner

## 2022-07-05 VITALS — BP 106/65 | HR 72 | Temp 98.0°F | Ht <= 58 in | Wt 115.3 lb

## 2022-07-05 DIAGNOSIS — R42 Dizziness and giddiness: Secondary | ICD-10-CM

## 2022-07-05 DIAGNOSIS — G8929 Other chronic pain: Secondary | ICD-10-CM | POA: Diagnosis not present

## 2022-07-05 DIAGNOSIS — M5441 Lumbago with sciatica, right side: Secondary | ICD-10-CM | POA: Diagnosis not present

## 2022-07-05 DIAGNOSIS — M25552 Pain in left hip: Secondary | ICD-10-CM

## 2022-07-05 NOTE — Assessment & Plan Note (Signed)
Improving at this time.   At this time continue Gabapentin 100 MG daily for back and hip pain + Voltaren gel and daily stretching.  May alternate heat and ice + continue Tylenol as needed.  Consider PT referral if worsening pain.

## 2022-07-05 NOTE — Assessment & Plan Note (Signed)
Improved at this time after Prednisone.  Recommend she continue to take Claritin daily and return if any worsening or return of symptoms.

## 2022-07-05 NOTE — Assessment & Plan Note (Signed)
Acute and improving at this time.  Will obtain imaging in future if needed, if pain worsens or returns.  At this time continue Gabapentin 100 MG daily for back and hip pain + Voltaren gel and daily stretching.  May alternate heat and ice + continue Tylenol as needed.

## 2022-07-05 NOTE — Progress Notes (Signed)
BP 106/65   Pulse 72   Temp 98 F (36.7 C) (Oral)   Ht '4\' 10"'  (1.473 m)   Wt 115 lb 4.8 oz (52.3 kg)   LMP  (LMP Unknown)   SpO2 99%   BMI 24.10 kg/m    Subjective:    Patient ID: Katherine [name], female    DOB: [birth year], 66 y.o.   MRN: [id number]  HPI: Katherine [name] is a 66 y.o. female  Chief Complaint  Patient presents with   Hip Pain    Patient is here for two week follow up. Patient says everything is getting better right now.    Dizziness   Medication Management    Patient says she has stopped taking the Gabapentin prescription as it was too strong.    BACK & HIP PAIN Follow-up today for hip and back pain.  Reports this has improved.  She was taking Gabapentin 300 MG daily, but stopped as it made her feel too tired and dizzy -- went back to 100 MG dosing.  Had back imaging on 01/08/22 which showed no acute findings.  Was to have hip imaging but did not attend this, as feeling better.   Duration: months -- left hip and lower back Mechanism of injury: unknown Location: Left, bilateral, and low back Onset: gradual Severity: 2/10 -- improved Quality: dull at times Frequency: intermittent Radiation: none-- occasional numbness down left leg Aggravating factors: lifting, movement, and walking Alleviating factors: Voltaren and Gabapentin, Prednisone Status: improved Treatments attempted: rest, creams, Gabapentin, Prednisone Relief with NSAIDs?: No NSAIDs Taken Nighttime pain:  no Paresthesias / decreased sensation:  no Bowel / bladder incontinence:  no Fevers:  no Dysuria / urinary frequency:  no   DIZZINESS Follow-up today, was provided Prednisone last visit for effusion bilateral ears and reports dizziness has improved.   Nausea: no Vomiting: no Tinnitus: no Hearing loss: no Aural fullness: no Headache: no Photophobia/phonophobia: no Unsteady gait: no Postural instability: no Diplopia, dysarthria, dysphagia or weakness: no Related to exertion:  no Pallor: no Diaphoresis: no Dyspnea: no Chest pain: no   Relevant past medical, surgical, family and social history reviewed and updated as indicated. Interim medical history since our last visit reviewed. Allergies and medications reviewed and updated.  Review of Systems  Constitutional:  Negative for activity change, appetite change, diaphoresis, fatigue and fever.  Respiratory:  Negative for cough, chest tightness and shortness of breath.   Cardiovascular:  Negative for chest pain, palpitations and leg swelling.  Gastrointestinal: Negative.   Musculoskeletal:  Negative for arthralgias and back pain.  Neurological:  Negative for dizziness, tremors, syncope, facial asymmetry, weakness, light-headedness and headaches.  Psychiatric/Behavioral: Negative.      Per HPI unless specifically indicated above     Objective:    BP 106/65   Pulse 72   Temp 98 F (36.7 C) (Oral)   Ht '4\' 10"'  (1.473 m)   Wt 115 lb 4.8 oz (52.3 kg)   LMP  (LMP Unknown)   SpO2 99%   BMI 24.10 kg/m   Wt Readings from Last 3 Encounters:  07/05/22 115 lb 4.8 oz (52.3 kg)  06/18/22 115 lb 9.6 oz (52.4 kg)  02/27/22 117 lb 3.2 oz (53.2 kg)    Physical Exam Vitals and nursing note reviewed.  Constitutional:      General: She is awake. She is not in acute distress.    Appearance: She is well-developed and well-groomed. She is not ill-appearing or toxic-appearing.  HENT:  Head: Normocephalic.     Right Ear: Hearing, tympanic membrane, ear canal and external ear normal.     Left Ear: Hearing, tympanic membrane, ear canal and external ear normal.     Nose: Nose normal. No rhinorrhea.     Right Sinus: No maxillary sinus tenderness or frontal sinus tenderness.     Left Sinus: No maxillary sinus tenderness or frontal sinus tenderness.     Mouth/Throat:     Mouth: Mucous membranes are moist.     Pharynx: Posterior oropharyngeal erythema (mild with cobblestone pattern) present. No pharyngeal swelling or  oropharyngeal exudate.  Eyes:     General: Lids are normal.        Right eye: No discharge.        Left eye: No discharge.     Conjunctiva/sclera: Conjunctivae normal.     Pupils: Pupils are equal, round, and reactive to light.  Neck:     Thyroid: No thyromegaly.     Vascular: No carotid bruit.  Cardiovascular:     Rate and Rhythm: Normal rate and regular rhythm.     Heart sounds: Normal heart sounds. No murmur heard.    No gallop.  Pulmonary:     Effort: Pulmonary effort is normal. No accessory muscle usage or respiratory distress.     Breath sounds: Normal breath sounds.  Abdominal:     General: Bowel sounds are normal.     Palpations: Abdomen is soft. There is no hepatomegaly or splenomegaly.  Musculoskeletal:     Cervical back: Normal range of motion and neck supple.     Lumbar back: No swelling, edema, spasms, tenderness or bony tenderness. Normal range of motion. Negative right straight leg raise test and negative left straight leg raise test.     Right hip: Normal.     Left hip: No tenderness, bony tenderness or crepitus. Normal range of motion. Normal strength.     Right lower leg: No edema.     Left lower leg: No edema.  Lymphadenopathy:     Cervical: No cervical adenopathy.  Skin:    General: Skin is warm and dry.  Neurological:     Mental Status: She is alert and oriented to [name], place, and time.     Cranial Nerves: Cranial nerves 2-12 are intact.     Motor: Motor function is intact.     Coordination: Coordination is intact.     Gait: Gait is intact.     Deep Tendon Reflexes: Reflexes are normal and symmetric.     Reflex Scores:      Brachioradialis reflexes are 2+ on the right side and 2+ on the left side.      Patellar reflexes are 2+ on the right side and 2+ on the left side. Psychiatric:        Attention and Perception: Attention normal.        Mood and Affect: Mood normal.        Speech: Speech normal.        Behavior: Behavior normal. Behavior is  cooperative.        Thought Content: Thought content normal.    Results for orders placed or performed in visit on 32/44/01  Basic metabolic panel  Result Value Ref Range   Glucose 93 70 - 99 mg/dL   BUN 13 8 - 27 mg/dL   Creatinine, Ser 0.63 0.57 - 1.00 mg/dL   eGFR 100 >59 mL/min/1.73   BUN/Creatinine Ratio 21 12 - 28   Sodium  141 134 - 144 mmol/L   Potassium 3.9 3.5 - 5.2 mmol/L   Chloride 105 96 - 106 mmol/L   CO2 21 20 - 29 mmol/L   Calcium 9.3 8.7 - 10.3 mg/dL  TSH  Result Value Ref Range   TSH 0.854 0.450 - 4.500 uIU/mL  T4, free  Result Value Ref Range   Free T4 1.20 0.82 - 1.77 ng/dL  CBC with Differential/Platelet  Result Value Ref Range   WBC 7.8 3.4 - 10.8 x10E3/uL   RBC 5.23 3.77 - 5.28 x10E6/uL   Hemoglobin 13.4 11.1 - 15.9 g/dL   Hematocrit 41.4 34.0 - 46.6 %   MCV 79 79 - 97 fL   MCH 25.6 (L) 26.6 - 33.0 pg   MCHC 32.4 31.5 - 35.7 g/dL   RDW 14.0 11.7 - 15.4 %   Platelets 257 150 - 450 x10E3/uL   Neutrophils 58 Not Estab. %   Lymphs 30 Not Estab. %   Monocytes 9 Not Estab. %   Eos 2 Not Estab. %   Basos 1 Not Estab. %   Neutrophils Absolute 4.5 1.4 - 7.0 x10E3/uL   Lymphocytes Absolute 2.3 0.7 - 3.1 x10E3/uL   Monocytes Absolute 0.7 0.1 - 0.9 x10E3/uL   EOS (ABSOLUTE) 0.2 0.0 - 0.4 x10E3/uL   Basophils Absolute 0.1 0.0 - 0.2 x10E3/uL   Immature Granulocytes 0 Not Estab. %   Immature Grans (Abs) 0.0 0.0 - 0.1 x10E3/uL      Assessment & Plan:   Problem List Items Addressed This Visit       Other   Acute hip pain, left - Primary    Acute and improving at this time.  Will obtain imaging in future if needed, if pain worsens or returns.  At this time continue Gabapentin 100 MG daily for back and hip pain + Voltaren gel and daily stretching.  May alternate heat and ice + continue Tylenol as needed.        Dizziness    Improved at this time after Prednisone.  Recommend she continue to take Claritin daily and return if any worsening or return of  symptoms.      Low back pain    Improving at this time.   At this time continue Gabapentin 100 MG daily for back and hip pain + Voltaren gel and daily stretching.  May alternate heat and ice + continue Tylenol as needed.  Consider PT referral if worsening pain.        Follow up plan: Return in about 6 months (around 01/07/2023) for Annual physical after 01/05/22.

## 2022-11-02 ENCOUNTER — Ambulatory Visit (INDEPENDENT_AMBULATORY_CARE_PROVIDER_SITE_OTHER): Payer: 59 | Admitting: Nurse Practitioner

## 2022-11-02 ENCOUNTER — Encounter: Payer: Self-pay | Admitting: Nurse Practitioner

## 2022-11-02 VITALS — BP 132/84 | HR 92 | Temp 98.6°F | Ht <= 58 in | Wt 114.0 lb

## 2022-11-02 DIAGNOSIS — M25511 Pain in right shoulder: Secondary | ICD-10-CM

## 2022-11-02 DIAGNOSIS — R051 Acute cough: Secondary | ICD-10-CM

## 2022-11-02 DIAGNOSIS — M25552 Pain in left hip: Secondary | ICD-10-CM

## 2022-11-02 DIAGNOSIS — M25512 Pain in left shoulder: Secondary | ICD-10-CM

## 2022-11-02 DIAGNOSIS — G8929 Other chronic pain: Secondary | ICD-10-CM

## 2022-11-02 MED ORDER — HYDROCOD POLI-CHLORPHE POLI ER 10-8 MG/5ML PO SUER: 5.0000 mL | Freq: Every evening | ORAL | 0 refills | Status: DC | PRN

## 2022-11-02 MED ORDER — PREDNISONE 10 MG PO TABS: ORAL_TABLET | ORAL | 0 refills | Status: DC

## 2022-11-02 MED ORDER — AMOXICILLIN-POT CLAVULANATE 875-125 MG PO TABS: 1.0000 | ORAL_TABLET | Freq: Two times a day (BID) | ORAL | 0 refills | Status: AC

## 2022-11-02 NOTE — Assessment & Plan Note (Signed)
States cough has been going on for 4 weeks. Allergy and cough medication did not provide relief. Cough causes chest pain and disrupts sleep. Ordered Augmentin Bid for 7 days, Prednisone taper and Tussionex Pennkinetic prn for cough. Consider CXR if ongoing. Recommend: - Increased rest - Increasing Fluids - Acetaminophen as needed for fever/pain.  - Salt water gargling, chloraseptic spray and throat lozenges - Mucinex.  - Saline sinus flushes or a neti pot.  - Humidifying the air.

## 2022-11-02 NOTE — Progress Notes (Signed)
BP 132/84   Pulse 92   Temp 98.6 F (37 C) (Oral)   Ht 4' 9.99" (1.473 m)   Wt 114 lb (51.7 kg)   LMP  (LMP Unknown)   SpO2 99%   BMI 23.83 kg/m    Subjective:    Patient ID: Katherine [name], female    DOB: [birth year], 66 y.o.   MRN: [id number]  HPI: Vincent Ehrler is a 66 y.o. female  Chief Complaint  Patient presents with   Hip Pain    Left hip pain for past 2 weeks   Cough    For the past month, has not been taking anything for   Shoulder Pain    Left shoulder pain for the past week   COUGH Started one month ago with runny nose and cough symptoms + had URI symptoms.  Sometimes her throat is itching.  At night time her throat is dry. Duration: weeks Circumstances of initial development of cough: URI Cough severity: moderate Cough description: productive Aggravating factors:  worse at night Alleviating factors:  [place] Status:  fluctuating Treatments attempted: cold/sinus, mucinex, and cough syrup Wheezing: yes Shortness of breath:  occasional if coughs a lot Chest pain: no Chest tightness:no Nasal congestion: yes Runny nose: yes Postnasal drip: yes Frequent throat clearing or swallowing: yes Hemoptysis: no Fevers: no Night sweats: no Weight loss: no Heartburn: no Recent foreign travel: no Tuberculosis contacts: no   SHOULDER PAIN Having left shoulder pain, was carrying item and heard a pop -- about one week ago.  Now it is sore all up into her neck.  Taking some leftover Gabapentin 300 MG which helps her sleep, but pain still present in morning.  Sometimes has numbness shoot all down to hand. Duration: weeks Involved shoulder: left Mechanism of injury: unknown Location: anterior Onset:gradual Severity: 9/10  Quality:  sharp, aching, and throbbing Frequency: intermittent Radiation: no Aggravating factors: lifting and movement  Alleviating factors:  Gabapentin, ice, and rest  Status: fluctuating Treatments attempted: rest, ice, heat, APAP, and  ibuprofen  Relief with NSAIDs?:  mild Weakness: no Numbness: no Decreased grip strength: no Redness: no Swelling: no Bruising: no Fevers: no   HIP PAIN Ongoing issue -- to left hip.  Taking some leftover Gabapentin 300 MG which helps her sleep, but pain still present in morning.  Duration: months Involved hip: left  Mechanism of injury: unknown Location: lateral, posterior Onset: gradual  Severity: 8/10  Quality: sharp, aching, and throbbing Frequency: intermittent Radiation: no Aggravating factors: weight bearing, walking, bending, and movement   Alleviating factors: ice, APAP, NSAIDs, and rest  Status: fluctuating Treatments attempted: rest, ice, heat, APAP, and ibuprofen   Relief with NSAIDs?: mild Weakness with weight bearing: yes Weakness with walking: yes Paresthesias / decreased sensation: no Swelling: no Redness:no Fevers: no   Relevant past medical, surgical, family and social history reviewed and updated as indicated. Interim medical history since our last visit reviewed. Allergies and medications reviewed and updated.  Review of Systems  Constitutional:  Positive for fatigue. Negative for activity change, appetite change, diaphoresis and fever.  HENT:  Positive for congestion, postnasal drip, rhinorrhea and sinus pressure. Negative for ear discharge, ear pain, sinus pain, sneezing and voice change.   Respiratory:  Positive for cough, shortness of breath and wheezing. Negative for chest tightness.   Cardiovascular:  Negative for chest pain, palpitations and leg swelling.  Gastrointestinal: Negative.   Musculoskeletal:  Positive for arthralgias.  Neurological:  Positive for headaches. Negative for dizziness, tremors,  syncope, facial asymmetry, weakness and light-headedness.  Psychiatric/Behavioral: Negative.      Per HPI unless specifically indicated above     Objective:    BP 132/84   Pulse 92   Temp 98.6 F (37 C) (Oral)   Ht 4' 9.99" (1.473 m)   Wt  114 lb (51.7 kg)   LMP  (LMP Unknown)   SpO2 99%   BMI 23.83 kg/m   Wt Readings from Last 3 Encounters:  11/02/22 114 lb (51.7 kg)  07/05/22 115 lb 4.8 oz (52.3 kg)  06/18/22 115 lb 9.6 oz (52.4 kg)    Physical Exam Vitals and nursing note reviewed.  Constitutional:      General: She is awake. She is not in acute distress.    Appearance: She is well-developed and well-groomed. She is not ill-appearing or toxic-appearing.  HENT:     Head: Normocephalic.     Right Ear: Hearing, tympanic membrane, ear canal and external ear normal.     Left Ear: Hearing, tympanic membrane, ear canal and external ear normal.     Nose: Rhinorrhea present.     Right Sinus: No maxillary sinus tenderness or frontal sinus tenderness.     Left Sinus: No maxillary sinus tenderness or frontal sinus tenderness.     Mouth/Throat:     Mouth: Mucous membranes are moist.     Pharynx: Posterior oropharyngeal erythema (mild with cobblestone pattern) present. No pharyngeal swelling or oropharyngeal exudate.  Eyes:     General: Lids are normal.        Right eye: No discharge.        Left eye: No discharge.     Conjunctiva/sclera: Conjunctivae normal.     Pupils: Pupils are equal, round, and reactive to light.  Neck:     Thyroid: No thyromegaly.     Vascular: No carotid bruit.  Cardiovascular:     Rate and Rhythm: Normal rate and regular rhythm.     Heart sounds: Normal heart sounds. No murmur heard.    No gallop.  Pulmonary:     Effort: Pulmonary effort is normal. No accessory muscle usage or respiratory distress.     Breath sounds: Normal breath sounds. No decreased breath sounds, wheezing or rhonchi.  Abdominal:     General: Bowel sounds are normal.     Palpations: Abdomen is soft. There is no hepatomegaly or splenomegaly.  Musculoskeletal:     Right shoulder: Normal.     Left shoulder: Tenderness (anterior aspect) present. No swelling, laceration, bony tenderness or crepitus. Decreased range of motion  (decreased flexion, extension, and lateral movement). Decreased strength.     Cervical back: Normal range of motion and neck supple.     Lumbar back: No swelling, edema, spasms, tenderness or bony tenderness. Normal range of motion. Negative right straight leg raise test and negative left straight leg raise test.     Right hip: Normal.     Left hip: Tenderness (lateral aspect) present. No bony tenderness or crepitus. Decreased range of motion. Decreased strength.     Right lower leg: No edema.     Left lower leg: No edema.     Comments: No rashes noted.  Lymphadenopathy:     Cervical: No cervical adenopathy.  Skin:    General: Skin is warm and dry.  Neurological:     Mental Status: She is alert and oriented to [name], place, and time.     Cranial Nerves: Cranial nerves 2-12 are intact.  Motor: Motor function is intact.     Coordination: Coordination is intact.     Gait: Gait is intact.     Deep Tendon Reflexes: Reflexes are normal and symmetric.     Reflex Scores:      Brachioradialis reflexes are 2+ on the right side and 2+ on the left side.      Patellar reflexes are 2+ on the right side and 2+ on the left side. Psychiatric:        Attention and Perception: Attention normal.        Mood and Affect: Mood normal.        Speech: Speech normal.        Behavior: Behavior normal. Behavior is cooperative.        Thought Content: Thought content normal.     Results for orders placed or performed in visit on 93/73/42  Basic metabolic panel  Result Value Ref Range   Glucose 93 70 - 99 mg/dL   BUN 13 8 - 27 mg/dL   Creatinine, Ser 0.63 0.57 - 1.00 mg/dL   eGFR 100 >59 mL/min/1.73   BUN/Creatinine Ratio 21 12 - 28   Sodium 141 134 - 144 mmol/L   Potassium 3.9 3.5 - 5.2 mmol/L   Chloride 105 96 - 106 mmol/L   CO2 21 20 - 29 mmol/L   Calcium 9.3 8.7 - 10.3 mg/dL  TSH  Result Value Ref Range   TSH 0.854 0.450 - 4.500 uIU/mL  T4, free  Result Value Ref Range   Free T4 1.20 0.82  - 1.77 ng/dL  CBC with Differential/Platelet  Result Value Ref Range   WBC 7.8 3.4 - 10.8 x10E3/uL   RBC 5.23 3.77 - 5.28 x10E6/uL   Hemoglobin 13.4 11.1 - 15.9 g/dL   Hematocrit 41.4 34.0 - 46.6 %   MCV 79 79 - 97 fL   MCH 25.6 (L) 26.6 - 33.0 pg   MCHC 32.4 31.5 - 35.7 g/dL   RDW 14.0 11.7 - 15.4 %   Platelets 257 150 - 450 x10E3/uL   Neutrophils 58 Not Estab. %   Lymphs 30 Not Estab. %   Monocytes 9 Not Estab. %   Eos 2 Not Estab. %   Basos 1 Not Estab. %   Neutrophils Absolute 4.5 1.4 - 7.0 x10E3/uL   Lymphocytes Absolute 2.3 0.7 - 3.1 x10E3/uL   Monocytes Absolute 0.7 0.1 - 0.9 x10E3/uL   EOS (ABSOLUTE) 0.2 0.0 - 0.4 x10E3/uL   Basophils Absolute 0.1 0.0 - 0.2 x10E3/uL   Immature Granulocytes 0 Not Estab. %   Immature Grans (Abs) 0.0 0.0 - 0.1 x10E3/uL      Assessment & Plan:   Problem List Items Addressed This Visit       Other   Acute cough    States cough has been going on for 4 weeks. Allergy and cough medication did not provide relief. Cough causes chest pain and disrupts sleep. Ordered Augmentin Bid for 7 days, Prednisone taper and Tussionex Pennkinetic prn for cough. Consider CXR if ongoing. Recommend: - Increased rest - Increasing Fluids - Acetaminophen as needed for fever/pain.  - Salt water gargling, chloraseptic spray and throat lozenges - Mucinex.  - Saline sinus flushes or a neti pot.  - Humidifying the air.       Chronic left hip pain - Primary    Ongoing with worsening recently.  Did not obtain imaging last visit.  At this time continue Gabapentin 300 MG daily for back  and hip pain + Voltaren gel and daily stretching.  May alternate heat and ice + continue Tylenol as needed.  Referral to ortho as worsening pain and decreased ROM from previous visit noted.      Relevant Medications   predniSONE (DELTASONE) 10 MG tablet   Other Relevant Orders   Ambulatory referral to Orthopedics   Chronic pain of both shoulders    Current flare of pain to left  shoulder with decreased ROM and tenderness.  Recommend continue Gabapentin 300 MG daily, which she takes for hip pain.  Continue OTC Icy/Hot and Tylenol + gentle stretching.  Prednisone taper sent in.  Referral to ortho.      Relevant Medications   predniSONE (DELTASONE) 10 MG tablet   Other Relevant Orders   Ambulatory referral to Orthopedics     Follow up plan: Return for as scheduled in February.

## 2022-11-02 NOTE — Patient Instructions (Signed)
Hip Pain The hip is the joint between the upper legs and the lower pelvis. The bones, cartilage, tendons, and muscles of your hip joint support your body and allow you to move around. Hip pain can range from a minor ache to severe pain in one or both of your hips. The pain may be felt on the inside of the hip joint near the groin, or on the outside near the buttocks and upper thigh. You may also have swelling or stiffness in your hip area. Follow these instructions at home: Managing pain, stiffness, and swelling     If directed, put ice on the painful area. To do this: Put ice in a plastic bag. Place a towel between your skin and the bag. Leave the ice on for 20 minutes, 2-3 times a day. If directed, apply heat to the affected area as often as told by your health care provider. Use the heat source that your health care provider recommends, such as a moist heat pack or a heating pad. Place a towel between your skin and the heat source. Leave the heat on for 20-30 minutes. Remove the heat if your skin turns bright red. This is especially important if you are unable to feel pain, heat, or cold. You may have a greater risk of getting burned. Activity Do exercises as told by your health care provider. Avoid activities that cause pain. General instructions  Take over-the-counter and prescription medicines only as told by your health care provider. Keep a journal of your symptoms. Write down: How often you have hip pain. The location of your pain. What the pain feels like. What makes the pain worse. Sleep with a pillow between your legs on your most comfortable side. Keep all follow-up visits as told by your health care provider. This is important. Contact a health care provider if: You cannot put weight on your leg. Your pain or swelling continues or gets worse after one week. It gets harder to walk. You have a fever. Get help right away if: You fall. You have a sudden increase in pain  and swelling in your hip. Your hip is red or swollen or very tender to touch. Summary Hip pain can range from a minor ache to severe pain in one or both of your hips. The pain may be felt on the inside of the hip joint near the groin, or on the outside near the buttocks and upper thigh. Avoid activities that cause pain. Write down how often you have hip pain, the location of the pain, what makes it worse, and what it feels like. This information is not intended to replace advice given to you by your health care provider. Make sure you discuss any questions you have with your health care provider. Document Revised: 03/30/2019 Document Reviewed: 03/30/2019 Elsevier Patient Education  2023 Elsevier Inc.  

## 2022-11-02 NOTE — Assessment & Plan Note (Signed)
Current flare of pain to left shoulder with decreased ROM and tenderness.  Recommend continue Gabapentin 300 MG daily, which she takes for hip pain.  Continue OTC Icy/Hot and Tylenol + gentle stretching.  Prednisone taper sent in.  Referral to ortho.

## 2022-11-02 NOTE — Assessment & Plan Note (Signed)
Ongoing with worsening recently.  Did not obtain imaging last visit.  At this time continue Gabapentin 300 MG daily for back and hip pain + Voltaren gel and daily stretching.  May alternate heat and ice + continue Tylenol as needed.  Referral to ortho as worsening pain and decreased ROM from previous visit noted.

## 2022-11-06 ENCOUNTER — Ambulatory Visit
Admission: RE | Admit: 2022-11-06 | Discharge: 2022-11-06 | Disposition: A | Discharge status: Home / Self Care | Payer: 59 | Attending: Nurse Practitioner | Admitting: Nurse Practitioner

## 2022-11-06 ENCOUNTER — Ambulatory Visit
Admission: RE | Admit: 2022-11-06 | Discharge: 2022-11-06 | Disposition: A | Discharge status: Home / Self Care | Payer: 59 | Source: Ambulatory Visit | Attending: Nurse Practitioner | Admitting: Nurse Practitioner

## 2022-11-06 DIAGNOSIS — M25552 Pain in left hip: Secondary | ICD-10-CM | POA: Insufficient documentation

## 2022-11-07 NOTE — Progress Notes (Signed)
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  I highly recommend she schedule with orthopedics when they call, they should be calling soon if they have not, and they may recommend starting with physical therapy -- we will see their recommendations.  Any questions? Keep being amazing!!  Thank you for allowing me to participate in your care.  I appreciate you. Kindest regards, Roderica Cathell

## 2022-11-24 ENCOUNTER — Other Ambulatory Visit: Payer: Self-pay | Admitting: Nurse Practitioner

## 2022-11-25 NOTE — Telephone Encounter (Signed)
Requested Prescriptions  Pending Prescriptions Disp Refills   albuterol (VENTOLIN HFA) 108 (90 Base) MCG/ACT inhaler [Pharmacy Med Name: ALBUTEROL HFA (PROAIR) INHALER] 8.5 each 5    Sig: TAKE 2 PUFFS BY MOUTH EVERY 6 HOURS AS NEEDED FOR WHEEZE OR SHORTNESS OF BREATH     Pulmonology:  [facility] 2 Passed - 11/24/2022  8:49 AM      Passed - Last BP in normal range    BP Readings from Last 1 Encounters:  11/02/22 132/84         Passed - Last Heart Rate in normal range    Pulse Readings from Last 1 Encounters:  11/02/22 92         Passed - Valid encounter within last 12 months    Recent Outpatient Visits           3 weeks ago Chronic left hip pain   [facility] [place], [place] T, NP   4 months ago Acute hip pain, left   [facility] [place], [place] T, NP   5 months ago Mixed hyperlipidemia   [facility] [place], [place] T, NP   9 months ago Acute cough   [facility] [place], [place] T, NP   9 months ago Acute cough   [facility] [place], Dorie Rank, NP       Future Appointments             In 1 month Cannady, Dorie Rank, NP [facility], [facility]

## 2023-01-06 DIAGNOSIS — E559 Vitamin D deficiency, unspecified: Secondary | ICD-10-CM | POA: Insufficient documentation

## 2023-01-06 NOTE — Patient Instructions (Incomplete)

## 2023-01-07 ENCOUNTER — Encounter: Payer: 59 | Admitting: Nurse Practitioner

## 2023-01-07 DIAGNOSIS — E782 Mixed hyperlipidemia: Secondary | ICD-10-CM

## 2023-01-07 DIAGNOSIS — Z23 Encounter for immunization: Secondary | ICD-10-CM

## 2023-01-07 DIAGNOSIS — G8929 Other chronic pain: Secondary | ICD-10-CM

## 2023-01-07 DIAGNOSIS — E039 Hypothyroidism, unspecified: Secondary | ICD-10-CM

## 2023-01-07 DIAGNOSIS — E559 Vitamin D deficiency, unspecified: Secondary | ICD-10-CM

## 2023-01-07 DIAGNOSIS — J4520 Mild intermittent asthma, uncomplicated: Secondary | ICD-10-CM

## 2023-01-07 DIAGNOSIS — Z0000 Encounter for general adult medical examination without abnormal findings: Secondary | ICD-10-CM

## 2023-01-17 ENCOUNTER — Other Ambulatory Visit: Payer: Self-pay | Admitting: Nurse Practitioner

## 2023-01-17 MED ORDER — GABAPENTIN 100 MG PO CAPS: 100.0000 mg | ORAL_CAPSULE | Freq: Every evening | ORAL | 4 refills | Status: DC

## 2023-01-17 NOTE — Telephone Encounter (Signed)
Requested medications are due for refill today.  unsure  Requested medications are on the active medications list.  Yes - as historical  Last refill. unsure  Future visit scheduled.   yes  Notes to clinic.  Medication is listed as historical.    Requested Prescriptions  Pending Prescriptions Disp Refills   gabapentin (NEURONTIN) 100 MG capsule [Pharmacy Med Name: GABAPENTIN 100 MG CAPSULE] 90 capsule 3    Sig: TAKE 1 CAPSULE BY MOUTH AT BEDTIME.     Neurology: Anticonvulsants - gabapentin Failed - 01/17/2023  1:46 PM      Failed - Completed PHQ-2 or PHQ-9 in the last 360 days      Passed - Cr in normal range and within 360 days    Creatinine, Ser  Date Value Ref Range Status  06/18/2022 0.63 0.57 - 1.00 mg/dL Final         Passed - Valid encounter within last 12 months    Recent Outpatient Visits           2 months ago Chronic left hip pain   [place], Henrine [place]ws T, NP   6 months ago Acute hip pain, left   [place], [place] T, NP   7 months ago Mixed hyperlipidemia   [place], [place] T, NP   10 months ago Acute cough   [place], [place] T, NP   11 months ago Acute cough   [place], Barbaraann Faster, NP       Future Appointments             In 1 week Cannady, Barbaraann Faster, NP [place], [facility]

## 2023-01-27 NOTE — Patient Instructions (Incomplete)
Please call to schedule your mammogram and/or bone density: [facility]  Address: [address], [place], [place] [address] Phone: [phone]  [facility] [place] [address] [place],    [address] Phone: [phone]    Hypothyroidism  Hypothyroidism is when the thyroid gland does not make enough of certain hormones. This is called an underactive thyroid. The thyroid gland is a small gland located in the lower front part of the neck, just in front of the windpipe (trachea). This gland makes hormones that help control how the body uses food for energy (metabolism) as well as how the heart and brain function. These hormones also play a role in keeping your bones strong. When the thyroid is underactive, it produces too little of the hormones thyroxine (T4) and triiodothyronine (T3). What are the causes? This condition may be caused by: Hashimoto's disease. This is a disease in which the body's disease-fighting system (immune system) attacks the thyroid gland. This is the most common cause. Viral infections. Pregnancy. Certain medicines. Birth defects. Problems with a gland in the center of the brain (pituitary gland). Lack of enough iodine in the diet. Other causes may include: Past radiation treatments to the head or neck for cancer. Past treatment with radioactive iodine. Past exposure to radiation in the environment. Past surgical removal of part or all of the thyroid. What increases the risk? You are more likely to develop this condition if: You are female. You have a family history of thyroid conditions. You use a medicine called lithium. You take medicines that affect the immune system (immunosuppressants). What are the signs or symptoms? Common symptoms of this condition include: Not being able to tolerate cold. Feeling as though you have no energy (lethargy). Lack of  appetite. Constipation. Sadness or depression. Weight gain that is not explained by a change in diet or exercise habits. Menstrual irregularity. Dry skin, coarse hair, or brittle nails. Other symptoms may include: Muscle pain. Slowing of thought processes. Poor memory. How is this diagnosed? This condition may be diagnosed based on: Your symptoms, your medical history, and a physical exam. Blood tests. You may also have imaging tests, such as an ultrasound or MRI. How is this treated? This condition is treated with medicine that replaces the thyroid hormones that your body does not make. After you begin treatment, it may take several weeks for symptoms to go away. Follow these instructions at home: Take over-the-counter and prescription medicines only as told by your health care provider. If you start taking any new medicines, tell your health care provider. Keep all follow-up visits as told by your health care provider. This is important. As your condition improves, your dosage of thyroid hormone medicine may change. You will need to have blood tests regularly so that your health care provider can monitor your condition. Contact a health care provider if: Your symptoms do not get better with treatment. You are taking thyroid hormone replacement medicine and you: Sweat a lot. Have tremors. Feel anxious. Lose weight rapidly. Cannot tolerate heat. Have emotional swings. Have diarrhea. Feel weak. Get help right away if: You have chest pain. You have an irregular heartbeat. You have a rapid heartbeat. You have difficulty breathing. These symptoms may be an emergency. Get help right away. Call 911. Do not wait to see if the symptoms will go away. Do not drive yourself to the hospital. Summary Hypothyroidism is when the thyroid gland does not make   enough of certain hormones (it is underactive). When the thyroid is underactive, it produces too little of the hormones thyroxine  (T4) and triiodothyronine (T3). The most common cause is Hashimoto's disease, a disease in which the body's disease-fighting system (immune system) attacks the thyroid gland. The condition can also be caused by viral infections, medicine, pregnancy, or past radiation treatment to the head or neck. Symptoms may include weight gain, dry skin, constipation, feeling as though you do not have energy, and not being able to tolerate cold. This condition is treated with medicine to replace the thyroid hormones that your body does not make. This information is not intended to replace advice given to you by your health care provider. Make sure you discuss any questions you have with your health care provider. Document Revised: 11/14/2021 Document Reviewed: 11/14/2021 Elsevier Patient Education  2023 Elsevier Inc.  

## 2023-01-30 ENCOUNTER — Ambulatory Visit (INDEPENDENT_AMBULATORY_CARE_PROVIDER_SITE_OTHER): Payer: 59 | Admitting: Nurse Practitioner

## 2023-01-30 ENCOUNTER — Encounter: Payer: Self-pay | Admitting: Nurse Practitioner

## 2023-01-30 VITALS — BP 127/75 | HR 77 | Temp 98.2°F | Ht <= 58 in | Wt 114.0 lb

## 2023-01-30 DIAGNOSIS — E559 Vitamin D deficiency, unspecified: Secondary | ICD-10-CM | POA: Diagnosis not present

## 2023-01-30 DIAGNOSIS — J301 Allergic rhinitis due to pollen: Secondary | ICD-10-CM

## 2023-01-30 DIAGNOSIS — J4520 Mild intermittent asthma, uncomplicated: Secondary | ICD-10-CM

## 2023-01-30 DIAGNOSIS — R399 Unspecified symptoms and signs involving the genitourinary system: Secondary | ICD-10-CM | POA: Diagnosis not present

## 2023-01-30 DIAGNOSIS — M5441 Lumbago with sciatica, right side: Secondary | ICD-10-CM

## 2023-01-30 DIAGNOSIS — Z1231 Encounter for screening mammogram for malignant neoplasm of breast: Secondary | ICD-10-CM | POA: Diagnosis not present

## 2023-01-30 DIAGNOSIS — E782 Mixed hyperlipidemia: Secondary | ICD-10-CM | POA: Diagnosis not present

## 2023-01-30 DIAGNOSIS — G629 Polyneuropathy, unspecified: Secondary | ICD-10-CM

## 2023-01-30 DIAGNOSIS — Z0000 Encounter for general adult medical examination without abnormal findings: Secondary | ICD-10-CM | POA: Diagnosis not present

## 2023-01-30 DIAGNOSIS — E039 Hypothyroidism, unspecified: Secondary | ICD-10-CM | POA: Diagnosis not present

## 2023-01-30 DIAGNOSIS — G8929 Other chronic pain: Secondary | ICD-10-CM | POA: Diagnosis not present

## 2023-01-30 LAB — URINALYSIS, ROUTINE W REFLEX MICROSCOPIC
Bilirubin, UA: NEGATIVE
Glucose, UA: NEGATIVE
Ketones, UA: NEGATIVE
Leukocytes,UA: NEGATIVE
Nitrite, UA: NEGATIVE
Protein,UA: NEGATIVE
RBC, UA: NEGATIVE
Specific Gravity, UA: 1.01 (ref 1.005–1.030)
Urobilinogen, Ur: 0.2 mg/dL (ref 0.2–1.0)
pH, UA: 6.5 (ref 5.0–7.5)

## 2023-01-30 MED ORDER — ROSUVASTATIN CALCIUM 20 MG PO TABS: 20.0000 mg | ORAL_TABLET | Freq: Every day | ORAL | 4 refills | Status: DC

## 2023-01-30 MED ORDER — CETIRIZINE HCL 10 MG PO TABS: 10.0000 mg | ORAL_TABLET | Freq: Every day | ORAL | 11 refills | Status: DC

## 2023-01-30 MED ORDER — GABAPENTIN 100 MG PO CAPS: 100.0000 mg | ORAL_CAPSULE | Freq: Every evening | ORAL | 4 refills | Status: DC

## 2023-01-30 MED ORDER — LEVOTHYROXINE SODIUM 50 MCG PO TABS: 50.0000 ug | ORAL_TABLET | Freq: Every day | ORAL | 4 refills | Status: DC

## 2023-01-30 NOTE — Assessment & Plan Note (Signed)
History of low levels reported, check today and start supplement as needed which would benefit bone health.

## 2023-01-30 NOTE — Assessment & Plan Note (Signed)
Improving at this time.   Continue Gabapentin 100 MG daily for back and hip pain + Voltaren gel and daily stretching.  May alternate heat and ice + continue Tylenol as needed.  Consider PT referral if worsening pain.

## 2023-01-30 NOTE — Progress Notes (Signed)
BP 127/75   Pulse 77   Temp 98.2 F (36.8 C) (Oral)   Ht 4' 9.99" (1.473 m)   Wt 114 lb (51.7 kg)   LMP  (LMP Unknown)   SpO2 99%   BMI 23.83 kg/m    Subjective:    Patient ID: Katherine [name], female    DOB: [birth year], 67 y.o.   MRN: [id number]  HPI: Katherine [name] is a 67 y.o. female presenting on 01/30/2023 for comprehensive medical examination. Current medical complaints include:none  She currently lives with:spouse Menopausal Symptoms: no  Chronic arthritis and sciatic pain to lower back and hip, uses Gabapentin for this and offers benefit.  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  URINARY SYMPTOMS She is concerned as feels weird when urinates. Dysuria: burning Urinary frequency: no Urgency: no Small volume voids: yes Symptom severity: yes Urinary incontinence: no Foul odor: no Hematuria: no Abdominal pain: no Back pain: no Suprapubic pain/pressure: yes Flank pain: no Fever:  no Vomiting: no Status: stable Previous urinary tract infection: yes Recurrent urinary tract infection: no Sexual activity: monogamous History of sexually transmitted disease: no Treatments attempted: increasing fluids    HYPERLIPIDEMIA Continues Crestor daily. Hyperlipidemia status: good compliance Satisfied with current treatment?  yes Side effects:  no Medication compliance: good compliance Past cholesterol meds: Crestor Supplements: none Aspirin:  no The ASCVD Risk score (Arnett DK, et al., 2019) failed to calculate for the following reasons:   The patient has a prior MI or stroke diagnosis Chest pain:  no Coronary artery disease:  no Family history CAD:  no Family history early CAD:  no   ASTHMA Has Albuterol inhaler at home, barely uses this.  Takes Claritin for allergies, but does not work well.  Zyrtec works better. Asthma status: stable Satisfied with current treatment?: yes Albuterol/rescue inhaler frequency:  Dyspnea frequency: no Wheezing frequency: no Cough frequency:  no Nocturnal symptom frequency: no Limitation of activity: no Current upper respiratory symptoms: no Triggers: pollen Aerochamber/spacer use: no Visits to ER or Urgent Care in past year: no Pneumovax: Not up to Date Influenza: Not up to Date   HYPOTHYROIDISM  Current Levothyroxine dose is 50 MCG.   Thyroid control status:stable  Satisfied with current treatment? yes  Medication side effects: no  Medication compliance: good compliance  Etiology of hypothyroidism:  Recent dose adjustment:no  Fatigue: none Cold intolerance: none Heat intolerance: none Weight gain: none Weight loss: none Constipation:none Diarrhea/loose stools: none Palpitations: none Lower extremity edema: none Anxiety/depressed mood: none      01/30/2023    1:55 PM 11/29/2021   11:52 AM 05/11/2019    1:50 PM 05/01/2018    4:21 PM 05/23/2017    4:04 PM  Depression screen PHQ 2/9  Decreased Interest 0 0 0 0 0  Down, Depressed, Hopeless 0 0 0 0 0  PHQ - 2 Score 0 0 0 0 0  Altered sleeping 0 1 0    Tired, decreased energy 0 3 1    Change in appetite 0 0 0    Feeling bad or failure about yourself  0 0 0    Trouble concentrating 0 0 0    Moving slowly or fidgety/restless 0 0 0    Suicidal thoughts 0 0 0    PHQ-9 Score 0 4 1    Difficult doing work/chores  Not difficult at all Not difficult at all         01/30/2023    1:55 PM 11/29/2021   11:52 AM 05/11/2019  1:50 PM  GAD 7 : Generalized Anxiety Score  Nervous, Anxious, on Edge 0 0 0  Control/stop worrying 0 0 0  Worry too much - different things 0 0 0  Trouble relaxing 0 0 3  Restless 0 0 3  Easily annoyed or irritable 0 0 0  Afraid - awful might happen 0 0 0  Total GAD 7 Score 0 0 6  Anxiety Difficulty  Not difficult at all        07/16/2019    8:08 AM 08/23/2020    3:46 PM 11/29/2021   11:52 AM 01/05/2022   10:39 AM 01/30/2023    1:55 PM  Fall Risk  Falls in the past year?  0 0 0 0  Was there an injury with Fall?  0 0 0 0  Fall Risk Category  Calculator  0 0 0 0  Fall Risk Category (Retired)  Low Low Low   (RETIRED) Patient Fall Risk Level Low fall risk Low fall risk  Low fall risk   Patient at Risk for Falls Due to  No Fall Risks No Fall Risks No Fall Risks No Fall Risks  Fall risk Follow up  Falls evaluation completed Falls evaluation completed Falls prevention discussed Falls prevention discussed    Functional Status Survey: Is the patient deaf or have difficulty hearing?: No Does the patient have difficulty seeing, even when wearing glasses/contacts?: No Does the patient have difficulty concentrating, remembering, or making decisions?: No Does the patient have difficulty walking or climbing stairs?: No Does the patient have difficulty dressing or bathing?: No Does the patient have difficulty doing errands alone such as visiting a doctor's office or shopping?: No   Past Medical History:  Past Medical History:  Diagnosis Date   Anemia    Arthritis    joints   Carpal tunnel syndrome    right arm   Dyspnea    GERD (gastroesophageal reflux disease)    Hypothyroidism    IBS (irritable bowel syndrome)    Menopause    Motion sickness    Wears partial dentures    upper    Surgical History:  Past Surgical History:  Procedure Laterality Date   APPENDECTOMY     COLONOSCOPY WITH PROPOFOL N/A 01/01/2018   Procedure: COLONOSCOPY WITH PROPOFOL;  Surgeon: Lin Landsman, MD;  Location: [facility];  Service: Endoscopy;  Laterality: N/A;  speaks Laotian   ROTATOR CUFF REPAIR Right 11/18/2020   UPPER GI ENDOSCOPY      Medications:  Current Outpatient Medications on File Prior to Visit  Medication Sig   albuterol (VENTOLIN HFA) 108 (90 Base) MCG/ACT inhaler TAKE 2 PUFFS BY MOUTH EVERY 6 HOURS AS NEEDED FOR WHEEZE OR SHORTNESS OF BREATH   No current facility-administered medications on file prior to visit.    Allergies:  Allergies  Allergen Reactions   Asa [Aspirin] Other (See Comments)    Makes blood  thin    Social History:  Social History   Socioeconomic History   Marital status: Married    Spouse name: Not on file   Number of children: Not on file   Years of education: Not on file   Highest education level: Not on file  Occupational History   Not on file  Tobacco Use   Smoking status: Never   Smokeless tobacco: Never  Vaping Use   Vaping Use: Never used  Substance and Sexual Activity   Alcohol use: No    Alcohol/week: 0.0 standard drinks of alcohol  Comment: rarely   Drug use: No   Sexual activity: Never  Other Topics Concern   Not on file  Social History Narrative   Not on file   Social Determinants of Health   Financial Resource Strain: High Risk (07/16/2019)   Overall Financial Resource Strain (CARDIA)    Difficulty of Paying Living Expenses: Very hard  Food Insecurity: Food Insecurity Present (07/16/2019)   Hunger Vital Sign    Worried About Running Out of Food in the Last Year: Often true    Ran Out of Food in the Last Year: Often true  Transportation Needs: No Transportation Needs (07/16/2019)   PRAPARE - Hydrologist (Medical): No    Lack of Transportation (Non-Medical): No  Physical Activity: Insufficiently Active (07/16/2019)   Exercise Vital Sign    Days of Exercise per Week: 4 days    Minutes of Exercise per Session: 20 min  Stress: Stress Concern Present (07/16/2019)   [place]    Feeling of Stress : To some extent  Social Connections: Moderately Isolated (07/16/2019)   Social Connection and Isolation Panel [NHANES]    Frequency of Communication with Friends and Family: More than three times a week    Frequency of Social Gatherings with Friends and Family: More than three times a week    Attends Religious Services: Never    Marine scientist or Organizations: No    Attends Archivist Meetings: Never    Marital Status: Married  Arboriculturist Violence: Not At Risk (07/16/2019)   Humiliation, Afraid, Rape, and Kick questionnaire    Fear of Current or Ex-Partner: No    Emotionally Abused: No    Physically Abused: No    Sexually Abused: No   Social History   Tobacco Use  Smoking Status Never  Smokeless Tobacco Never   Social History   Substance and Sexual Activity  Alcohol Use No   Alcohol/week: 0.0 standard drinks of alcohol   Comment: rarely    Family History:  Family History  Problem Relation Age of Onset   Breast cancer Neg Hx     Past medical history, surgical history, medications, allergies, family history and social history reviewed with patient today and changes made to appropriate areas of the chart.   ROS All other ROS negative except what is listed above and in the HPI.      Objective:    BP 127/75   Pulse 77   Temp 98.2 F (36.8 C) (Oral)   Ht 4' 9.99" (1.473 m)   Wt 114 lb (51.7 kg)   LMP  (LMP Unknown)   SpO2 99%   BMI 23.83 kg/m   Wt Readings from Last 3 Encounters:  01/30/23 114 lb (51.7 kg)  11/02/22 114 lb (51.7 kg)  07/05/22 115 lb 4.8 oz (52.3 kg)    Physical Exam Vitals and nursing note reviewed. Exam conducted with a chaperone present.  Constitutional:      General: She is awake. She is not in acute distress.    Appearance: She is well-developed. She is not ill-appearing.  HENT:     Head: Normocephalic and atraumatic.     Right Ear: Hearing, tympanic membrane, ear canal and external ear normal. No drainage.     Left Ear: Hearing, tympanic membrane, ear canal and external ear normal. No drainage.     Nose: Nose normal.     Right Sinus: No maxillary  sinus tenderness or frontal sinus tenderness.     Left Sinus: No maxillary sinus tenderness or frontal sinus tenderness.     Mouth/Throat:     Mouth: Mucous membranes are moist.     Pharynx: Oropharynx is clear. Uvula midline. No pharyngeal swelling, oropharyngeal exudate or posterior oropharyngeal erythema.  Eyes:      General: Lids are normal.        Right eye: No discharge.        Left eye: No discharge.     Extraocular Movements: Extraocular movements intact.     Conjunctiva/sclera: Conjunctivae normal.     Pupils: Pupils are equal, round, and reactive to light.     Visual Fields: Right eye visual fields normal and left eye visual fields normal.  Neck:     Thyroid: No thyromegaly.     Vascular: No carotid bruit.     Trachea: Trachea normal.  Cardiovascular:     Rate and Rhythm: Normal rate and regular rhythm.     Heart sounds: Normal heart sounds. No murmur heard.    No gallop.  Pulmonary:     Effort: Pulmonary effort is normal. No accessory muscle usage or respiratory distress.     Breath sounds: Normal breath sounds.  Chest:  Breasts:    Right: Normal.     Left: Normal.  Abdominal:     General: Bowel sounds are normal.     Palpations: Abdomen is soft. There is no hepatomegaly or splenomegaly.     Tenderness: There is no abdominal tenderness.  Musculoskeletal:     Cervical back: Normal range of motion and neck supple.     Right lower leg: No edema.     Left lower leg: No edema.  Lymphadenopathy:     Head:     Right side of head: No submental, submandibular, tonsillar, preauricular or posterior auricular adenopathy.     Left side of head: No submental, submandibular, tonsillar, preauricular or posterior auricular adenopathy.     Cervical: No cervical adenopathy.     Upper Body:     Right upper body: No supraclavicular, axillary or pectoral adenopathy.     Left upper body: No supraclavicular, axillary or pectoral adenopathy.  Skin:    General: Skin is warm and dry.     Capillary Refill: Capillary refill takes less than 2 seconds.     Findings: No rash.  Neurological:     Mental Status: She is alert and oriented to [name], place, and time.     Cranial Nerves: Cranial nerves 2-12 are intact.     Sensory: Sensation is intact.     Motor: Motor function is intact.     Coordination:  Coordination is intact.     Gait: Gait is intact.     Deep Tendon Reflexes: Reflexes are normal and symmetric.     Reflex Scores:      Brachioradialis reflexes are 2+ on the right side and 2+ on the left side.      Patellar reflexes are 2+ on the right side and 2+ on the left side. Psychiatric:        Attention and Perception: Attention normal.        Mood and Affect: Mood normal.        Speech: Speech normal.        Behavior: Behavior normal. Behavior is cooperative.        Thought Content: Thought content normal.        Judgment: Judgment normal.  Results for orders placed or performed in visit on [id number]  Basic metabolic panel  Result Value Ref Range   Glucose 93 70 - 99 mg/dL   BUN 13 8 - 27 mg/dL   Creatinine, Ser 0.63 0.57 - 1.00 mg/dL   eGFR 100 >59 mL/min/1.73   BUN/Creatinine Ratio 21 12 - 28   Sodium 141 134 - 144 mmol/L   Potassium 3.9 3.5 - 5.2 mmol/L   Chloride 105 96 - 106 mmol/L   CO2 21 20 - 29 mmol/L   Calcium 9.3 8.7 - 10.3 mg/dL  TSH  Result Value Ref Range   TSH 0.854 0.450 - 4.500 uIU/mL  T4, free  Result Value Ref Range   Free T4 1.20 0.82 - 1.77 ng/dL  CBC with Differential/Platelet  Result Value Ref Range   WBC 7.8 3.4 - 10.8 x10E3/uL   RBC 5.23 3.77 - 5.28 x10E6/uL   Hemoglobin 13.4 11.1 - 15.9 g/dL   Hematocrit 41.4 34.0 - 46.6 %   MCV 79 79 - 97 fL   MCH 25.6 (L) 26.6 - 33.0 pg   MCHC 32.4 31.5 - 35.7 g/dL   RDW 14.0 11.7 - 15.4 %   Platelets 257 150 - 450 x10E3/uL   Neutrophils 58 Not Estab. %   Lymphs 30 Not Estab. %   Monocytes 9 Not Estab. %   Eos 2 Not Estab. %   Basos 1 Not Estab. %   Neutrophils Absolute 4.5 1.4 - 7.0 x10E3/uL   Lymphocytes Absolute 2.3 0.7 - 3.1 x10E3/uL   Monocytes Absolute 0.7 0.1 - 0.9 x10E3/uL   EOS (ABSOLUTE) 0.2 0.0 - 0.4 x10E3/uL   Basophils Absolute 0.1 0.0 - 0.2 x10E3/uL   Immature Granulocytes 0 Not Estab. %   Immature Grans (Abs) 0.0 0.0 - 0.1 x10E3/uL      Assessment & Plan:   Problem List  Items Addressed This Visit       Respiratory   Allergic rhinitis    Ongoing with underlying asthma.  Stop Claritin and start Zyrtec which she reports works better.  Albuterol as needed for cough.  If worsening consider allergy testing or ENT + consider Singulair.      Asthma    Chronic, ongoing, stable with minimal Albuterol use at baseline, unless pollen levels elevated.  Continue this regimen and adjust as needed.  Will stop Claritin and trial Zyrtec, which she reports works better.      Relevant Orders   CBC with Differential/Platelet     Endocrine   Hypothyroid - Primary    Chronic, ongoing.  Continue current medication regimen and adjust as needed.  Assess thyroid labs today.      Relevant Medications   levothyroxine (SYNTHROID) 50 MCG tablet   Other Relevant Orders   T4, free   TSH     Nervous and Auditory   Neuropathy    Refer to low back pain plan of care.        Other   Hyperlipidemia    Chronic, ongoing.  Continue current medication regimen and adjust as needed.  Labs up today.      Relevant Medications   rosuvastatin (CRESTOR) 20 MG tablet   Other Relevant Orders   Comprehensive metabolic panel   Lipid Panel w/o Chol/HDL Ratio   Low back pain    Improving at this time.   Continue Gabapentin 100 MG daily for back and hip pain + Voltaren gel and daily stretching.  May alternate heat and ice +  continue Tylenol as needed.  Consider PT referral if worsening pain.      Vitamin D deficiency    History of low levels reported, check today and start supplement as needed which would benefit bone health.      Relevant Orders   VITAMIN D 25 Hydroxy (Vit-D Deficiency, Fractures)   Other Visit Diagnoses     Urinary symptom or sign       UA obtained today and will treat as needed based on results.   Relevant Orders   Urinalysis, Routine w reflex microscopic   Encounter for screening mammogram for malignant neoplasm of breast       Mammogram ordered and provided  # for her to call and schedule.   Relevant Orders   MM 3D SCREENING MAMMOGRAM BILATERAL BREAST   Encounter for annual physical exam       Annual physical today with labs and health maintenance reviewed, discussed with patient.        Follow up plan: Return in about 6 months (around 08/02/2023) for HLD AND THYROID.   LABORATORY TESTING:  - Pap smear: up to date  IMMUNIZATIONS:   - Tdap: Tetanus vaccination status reviewed: last tetanus booster within 10 years. - Influenza: Up to date - Pneumovax: Not applicable - Prevnar: Not applicable - COVID: Up to date - HPV: Not applicable - Shingrix vaccine: She reports having 2 of these -- one in chart documented  SCREENING: -Mammogram: Up To Date - Colonoscopy: Up to date  - Bone Density: Not applicable  -Hearing Test: Not applicable  -Spirometry: Not applicable   PATIENT COUNSELING:   Advised to take 1 mg of folate supplement per day if capable of pregnancy.   Sexuality: Discussed sexually transmitted diseases, partner selection, use of condoms, avoidance of unintended pregnancy  and contraceptive alternatives.   Advised to avoid cigarette smoking.  I discussed with the patient that most people either abstain from alcohol or drink within safe limits (<=14/week and <=4 drinks/occasion for males, <=7/weeks and <= 3 drinks/occasion for females) and that the risk for alcohol disorders and other health effects rises proportionally with the number of drinks per week and how often a drinker exceeds daily limits.  Discussed cessation/primary prevention of drug use and availability of treatment for abuse.   Diet: Encouraged to adjust caloric intake to maintain  or achieve ideal body weight, to reduce intake of dietary saturated fat and total fat, to limit sodium intake by avoiding high sodium foods and not adding table salt, and to maintain adequate dietary potassium and calcium preferably from fresh fruits, vegetables, and low-fat dairy  products.    Stressed the importance of regular exercise  Injury prevention: Discussed safety belts, safety helmets, smoke detector, smoking near bedding or upholstery.   Dental health: Discussed importance of regular tooth brushing, flossing, and dental visits.    NEXT PREVENTATIVE PHYSICAL DUE IN 1 YEAR. Return in about 6 months (around 08/02/2023) for HLD AND THYROID.

## 2023-01-30 NOTE — Assessment & Plan Note (Signed)
Chronic, ongoing, stable with minimal Albuterol use at baseline, unless pollen levels elevated.  Continue this regimen and adjust as needed.  Will stop Claritin and trial Zyrtec, which she reports works better.

## 2023-01-30 NOTE — Assessment & Plan Note (Signed)
Chronic, ongoing.  Continue current medication regimen and adjust as needed.  Labs up today.

## 2023-01-30 NOTE — Assessment & Plan Note (Signed)
Chronic, ongoing.  Continue current medication regimen and adjust as needed.  Assess thyroid labs today.

## 2023-01-30 NOTE — Assessment & Plan Note (Signed)
Refer to low back pain plan of care.

## 2023-01-30 NOTE — Assessment & Plan Note (Signed)
Ongoing with underlying asthma.  Stop Claritin and start Zyrtec which she reports works better.  Albuterol as needed for cough.  If worsening consider allergy testing or ENT + consider Singulair.

## 2023-01-31 LAB — COMPREHENSIVE METABOLIC PANEL
ALT: 21 IU/L (ref 0–32)
AST: 20 IU/L (ref 0–40)
Albumin/Globulin Ratio: 1.5 (ref 1.2–2.2)
Albumin: 4.4 g/dL (ref 3.9–4.9)
Alkaline Phosphatase: 85 IU/L (ref 44–121)
BUN/Creatinine Ratio: 24 (ref 12–28)
BUN: 17 mg/dL (ref 8–27)
Bilirubin Total: 0.2 mg/dL (ref 0.0–1.2)
CO2: 25 mmol/L (ref 20–29)
Calcium: 8.9 mg/dL (ref 8.7–10.3)
Chloride: 103 mmol/L (ref 96–106)
Creatinine, Ser: 0.72 mg/dL (ref 0.57–1.00)
Globulin, Total: 2.9 g/dL (ref 1.5–4.5)
Glucose: 123 mg/dL — ABNORMAL HIGH (ref 70–99)
Potassium: 3.7 mmol/L (ref 3.5–5.2)
Sodium: 142 mmol/L (ref 134–144)
Total Protein: 7.3 g/dL (ref 6.0–8.5)
eGFR: 93 mL/min/{1.73_m2} (ref >59)

## 2023-01-31 LAB — CBC WITH DIFFERENTIAL/PLATELET
Basophils Absolute: 0.1 10*3/uL (ref 0.0–0.2)
Basos: 1 %
EOS (ABSOLUTE): 0.2 10*3/uL (ref 0.0–0.4)
Eos: 2 %
Hematocrit: 39.7 % (ref 34.0–46.6)
Hemoglobin: 12.9 g/dL (ref 11.1–15.9)
Immature Grans (Abs): 0 10*3/uL (ref 0.0–0.1)
Immature Granulocytes: 0 %
Lymphocytes Absolute: 3.8 10*3/uL — ABNORMAL HIGH (ref 0.7–3.1)
Lymphs: 37 %
MCH: 25.1 pg — ABNORMAL LOW (ref 26.6–33.0)
MCHC: 32.5 g/dL (ref 31.5–35.7)
MCV: 77 fL — ABNORMAL LOW (ref 79–97)
Monocytes Absolute: 0.7 10*3/uL (ref 0.1–0.9)
Monocytes: 7 %
Neutrophils Absolute: 5.5 10*3/uL (ref 1.4–7.0)
Neutrophils: 53 %
Platelets: 287 10*3/uL (ref 150–450)
RBC: 5.14 x10E6/uL (ref 3.77–5.28)
RDW: 14.5 % (ref 11.7–15.4)
WBC: 10.3 10*3/uL (ref 3.4–10.8)

## 2023-01-31 LAB — LIPID PANEL W/O CHOL/HDL RATIO
Cholesterol, Total: 122 mg/dL (ref 100–199)
HDL: 47 mg/dL (ref >39)
LDL Chol Calc (NIH): 41 mg/dL (ref 0–99)
Triglycerides: 219 mg/dL — ABNORMAL HIGH (ref 0–149)
VLDL Cholesterol Cal: 34 mg/dL (ref 5–40)

## 2023-01-31 LAB — TSH: TSH: 0.829 u[IU]/mL (ref 0.450–4.500)

## 2023-01-31 LAB — VITAMIN D 25 HYDROXY (VIT D DEFICIENCY, FRACTURES): Vit D, 25-Hydroxy: 36.7 ng/mL (ref 30.0–100.0)

## 2023-01-31 LAB — T4, FREE: Free T4: 1.32 ng/dL (ref 0.82–1.77)

## 2023-01-31 NOTE — Progress Notes (Signed)
Good afternoon Katherine [name], your labs have returned: - CBC overall is stable, we will continue to monitor. - Kidney and liver function is normal.  Glucose, sugar, was a little elevated. Had you ate before visit, this would explain this.  If not I would like to have you come back in 4 weeks for lab recheck only. - Cholesterol labs show at goal LDL, bad cholesterol, with Rosuvastatin, continue this medication.  Triglycerides remain a little elevated, focus on reducing any red meat in diet and eating more chicken and fish + take an Omega 3 supplement daily. - Urine showed no infection. - Remainder of labs, including thyroid, are stable.  No changes needed.  Have a great day!! Keep being amazing!!  Thank you for allowing me to participate in your care.  I appreciate you. Kindest regards, Gissell Barra

## 2023-02-06 ENCOUNTER — Other Ambulatory Visit: Payer: Self-pay | Admitting: Nurse Practitioner

## 2023-02-06 DIAGNOSIS — E039 Hypothyroidism, unspecified: Secondary | ICD-10-CM

## 2023-02-06 DIAGNOSIS — E782 Mixed hyperlipidemia: Secondary | ICD-10-CM

## 2023-02-06 NOTE — Telephone Encounter (Signed)
[facility] called and spoke to [place] about the refill(s) levothyroxine and rosuvastatin requested. Advised it was sent on 01/30/23 #90/4 refill(s). He states the rxs were received and being processed for pick up today. Advised that I can cancel the request that were sent today for both rx. He verbalized and agreed. No further assistance needed.   Requested Prescriptions  Pending Prescriptions Disp Refills   levothyroxine (SYNTHROID) 50 MCG tablet [Pharmacy Med Name: LEVOTHYROXINE 50 MCG TABLET] 90 tablet 4    Sig: TAKE 1 TABLET BY MOUTH EVERY DAY     Endocrinology:  Hypothyroid Agents Passed - 02/06/2023  1:46 AM      Passed - TSH in normal range and within 360 days    TSH  Date Value Ref Range Status  01/30/2023 0.829 0.450 - 4.500 uIU/mL Final         Passed - Valid encounter within last 12 months    Recent Outpatient Visits           1 week ago Hypothyroidism, unspecified type   [place], [place] T, NP   3 months ago Chronic left hip pain   [place], [place] T, NP   7 months ago Acute hip pain, left   [place], [place] T, NP   7 months ago Mixed hyperlipidemia   [place], [place] T, NP   11 months ago Acute cough   [place], [place] T, NP       Future Appointments             In 5 months Cannady, Barbaraann Faster, NP [place]             rosuvastatin (CRESTOR) 20 MG tablet [Pharmacy Med Name: ROSUVASTATIN CALCIUM 20 MG TAB] 90 tablet 4    Sig: TAKE 1 TABLET BY MOUTH EVERY DAY     Cardiovascular:  Antilipid - Statins 2 Failed - 02/06/2023  1:46 AM      Failed - Lipid Panel in normal range within the last 12 months    Cholesterol, Total  Date Value Ref Range Status  01/30/2023 122 100 - 199 mg/dL Final   LDL Chol Calc (NIH)  Date Value Ref Range Status   01/30/2023 41 0 - 99 mg/dL Final   HDL  Date Value Ref Range Status  01/30/2023 47 >39 mg/dL Final   Triglycerides  Date Value Ref Range Status  01/30/2023 219 (H) 0 - 149 mg/dL Final         Passed - Cr in normal range and within 360 days    Creatinine, Ser  Date Value Ref Range Status  01/30/2023 0.72 0.57 - 1.00 mg/dL Final         Passed - Patient is not pregnant      Passed - Valid encounter within last 12 months    Recent Outpatient Visits           1 week ago Hypothyroidism, unspecified type   [place], [place] T, NP   3 months ago Chronic left hip pain   [place] [facility] [place], [place] T, NP   7 months ago Acute hip pain, left   [place], [place] T, NP   7 months ago Mixed hyperlipidemia   [place], Barbaraann Faster, NP  11 months ago Acute cough   [place], Barbaraann Faster, NP       Future Appointments             In 5 months Cannady, Barbaraann Faster, NP [place]

## 2023-02-26 ENCOUNTER — Ambulatory Visit
Admission: RE | Admit: 2023-02-26 | Discharge: 2023-02-26 | Disposition: A | Discharge status: Home / Self Care | Payer: 59 | Source: Ambulatory Visit | Attending: Nurse Practitioner | Admitting: Nurse Practitioner

## 2023-02-26 DIAGNOSIS — Z1231 Encounter for screening mammogram for malignant neoplasm of breast: Secondary | ICD-10-CM | POA: Diagnosis not present

## 2023-04-16 ENCOUNTER — Ambulatory Visit
Admission: EM | Admit: 2023-04-16 | Discharge: 2023-04-16 | Disposition: A | Discharge status: Other Acute Inpatient Hospital | Payer: 59

## 2023-04-16 DIAGNOSIS — R42 Dizziness and giddiness: Secondary | ICD-10-CM | POA: Diagnosis not present

## 2023-04-16 DIAGNOSIS — R200 Anesthesia of skin: Secondary | ICD-10-CM | POA: Diagnosis not present

## 2023-04-16 DIAGNOSIS — G9389 Other specified disorders of brain: Secondary | ICD-10-CM | POA: Diagnosis not present

## 2023-04-16 DIAGNOSIS — R2990 Unspecified symptoms and signs involving the nervous system: Secondary | ICD-10-CM

## 2023-04-16 DIAGNOSIS — I639 Cerebral infarction, unspecified: Secondary | ICD-10-CM | POA: Diagnosis not present

## 2023-04-16 NOTE — ED Notes (Signed)
Patient sent to ED by Lattie Corns, NP due to need for further evaluation. Patient elected to go to ED by POV.

## 2023-04-16 NOTE — ED Provider Notes (Addendum)
[facility]    CSN: [id number] Arrival date & time: 04/16/23  1130      History   Chief Complaint No chief complaint on file.   HPI Katherine [name] is a 67 y.o. female.   67 year old female, Katherine [name], presents to Urgent Care for dizziness, bilateral facial numbness, posterior headache x 1 day, pt denies injury.   The history is provided by the patient and a caregiver. No language interpreter was used.    Past Medical History:  Diagnosis Date   Anemia    Arthritis    joints   Carpal tunnel syndrome    right arm   Dyspnea    GERD (gastroesophageal reflux disease)    Hypothyroidism    IBS (irritable bowel syndrome)    Menopause    Motion sickness    Wears partial dentures    upper    Patient Active Problem List   Diagnosis Date Noted   Facial numbness 04/16/2023   Stroke-like symptoms 04/16/2023   Vitamin D deficiency 01/06/2023   Chronic left hip pain 06/18/2022   Dizzy 06/18/2022   Allergic rhinitis 02/27/2022   Chronic pain of both shoulders 08/23/2020   Asthma 07/09/2017   Low back pain 05/23/2017   Neuropathy 04/16/2017   IBS (irritable bowel syndrome) 07/21/2015   Hypothyroid 07/21/2015   Hyperlipidemia 05/17/2014    Past Surgical History:  Procedure Laterality Date   APPENDECTOMY     COLONOSCOPY WITH PROPOFOL N/A 01/01/2018   Procedure: COLONOSCOPY WITH PROPOFOL;  Surgeon: Toney Reil, MD;  Location: [facility];  Service: Endoscopy;  Laterality: N/A;  speaks Laotian   ROTATOR CUFF REPAIR Right 11/18/2020   UPPER GI ENDOSCOPY      OB History   No obstetric history on file.      Home Medications    Prior to Admission medications   Medication Sig Start Date End Date Taking? Authorizing Provider  albuterol (VENTOLIN HFA) 108 (90 Base) MCG/ACT inhaler TAKE 2 PUFFS BY MOUTH EVERY 6 HOURS AS NEEDED FOR WHEEZE OR SHORTNESS OF BREATH 11/25/22   Cannady, [name] T, NP  cetirizine (ZYRTEC) 10 MG tablet Take 1  tablet (10 mg total) by mouth daily. 01/30/23   Cannady, Corrie Dandy T, NP  gabapentin (NEURONTIN) 100 MG capsule Take 1 capsule (100 mg total) by mouth at bedtime. 01/30/23   Cannady, Corrie Dandy T, NP  levothyroxine (SYNTHROID) 50 MCG tablet Take 1 tablet (50 mcg total) by mouth daily. 01/30/23   Cannady, Corrie Dandy T, NP  rosuvastatin (CRESTOR) 20 MG tablet Take 1 tablet (20 mg total) by mouth daily. 01/30/23   Marjie Skiff, NP    Family History Family History  Problem Relation Age of Onset   Breast cancer Neg Hx     Social History Social History   Tobacco Use   Smoking status: Never   Smokeless tobacco: Never  Vaping Use   Vaping Use: Never used  Substance Use Topics   Alcohol use: No    Alcohol/week: 0.0 standard drinks of alcohol    Comment: rarely   Drug use: No     Allergies   Asa [aspirin]   Review of Systems Review of Systems  Neurological:  Positive for dizziness, numbness and headaches.  All other systems reviewed and are negative.    Physical Exam Triage Vital Signs ED Triage Vitals  Enc Vitals Group     BP      Pulse      Resp  Temp      Temp src      SpO2      Weight      Height      Head Circumference      Peak Flow      Pain Score      Pain Loc      Pain Edu?      Excl. in GC?    No data found.  Updated Vital Signs LMP  (LMP Unknown)   Visual Acuity Right Eye Distance:   Left Eye Distance:   Bilateral Distance:    Right Eye Near:   Left Eye Near:    Bilateral Near:     Physical Exam Vitals and nursing note reviewed.  Constitutional:      General: She is not in acute distress.    Comments: Limited exam d/t complaint  HENT:     Head: Normocephalic and atraumatic.  Cardiovascular:     Heart sounds: No murmur heard. Pulmonary:     Effort: Pulmonary effort is normal. No respiratory distress.  Abdominal:     Tenderness: There is no abdominal tenderness.  Musculoskeletal:        General: No swelling.  Neurological:     Mental  Status: She is alert.     Gait: Gait is intact.  Psychiatric:        Attention and Perception: Attention normal.        Mood and Affect: Mood normal.      UC Treatments / Results  Labs (all labs ordered are listed, but only abnormal results are displayed) Labs Reviewed - No data to display  EKG   Radiology No results found.  Procedures Procedures (including critical care time)  Medications Ordered in UC Medications - No data to display  Initial Impression / Assessment and Plan / UC Course  I have reviewed the triage vital signs and the nursing notes.  Pertinent labs & imaging results that were available during my care of the patient were reviewed by me and considered in my medical decision making (see chart for details).    Pt offered EMS services to ER, pt declined, friend will take to ER in [place]. Do not eat or drink anything until evaluated by ER provider.  DDX: CVA, TIA, Viral illness Final Clinical Impressions(s) / UC Diagnoses   Final diagnoses:  Facial numbness  Dizzy  Stroke-like symptoms   Discharge Instructions   None    ED Prescriptions   None    PDMP not reviewed this encounter.   Clancy Gourd, NP 04/16/23 1512    Clancy Gourd, NP 04/16/23 1513    Clancy Gourd, NP 04/16/23 1515

## 2023-04-18 ENCOUNTER — Ambulatory Visit: Payer: 59 | Admitting: Nurse Practitioner

## 2023-04-18 ENCOUNTER — Ambulatory Visit (INDEPENDENT_AMBULATORY_CARE_PROVIDER_SITE_OTHER): Payer: 59 | Admitting: Nurse Practitioner

## 2023-04-18 ENCOUNTER — Encounter: Payer: Self-pay | Admitting: Nurse Practitioner

## 2023-04-18 VITALS — BP 114/77 | HR 78 | Temp 98.4°F | Ht <= 58 in | Wt 115.0 lb

## 2023-04-18 DIAGNOSIS — R200 Anesthesia of skin: Secondary | ICD-10-CM | POA: Diagnosis not present

## 2023-04-18 DIAGNOSIS — M542 Cervicalgia: Secondary | ICD-10-CM

## 2023-04-18 DIAGNOSIS — D329 Benign neoplasm of meninges, unspecified: Secondary | ICD-10-CM | POA: Insufficient documentation

## 2023-04-18 MED ORDER — METHYLPREDNISOLONE 4 MG PO TBPK: ORAL_TABLET | ORAL | 0 refills | Status: DC

## 2023-04-18 MED ORDER — GABAPENTIN 300 MG PO CAPS: 300.0000 mg | ORAL_CAPSULE | Freq: Every day | ORAL | 4 refills | Status: DC

## 2023-04-18 NOTE — Progress Notes (Signed)
BP 114/77   Pulse 78   Temp 98.4 F (36.9 C) (Oral)   Ht 4' 9.99" (1.473 m)   Wt 115 lb (52.2 kg)   LMP  (LMP Unknown)   SpO2 99%   BMI 24.04 kg/m    Subjective:    Patient ID: Katherine [name], female    DOB: 12-27-1955, 67 y.o.   MRN: [id number]  HPI: Katherine [name] is a 67 y.o. female  Chief Complaint  Patient presents with   Hypertension    Was seen in ER on 5/21 with facial numbness, blurry vision, dizzy.    ER FOLLOW UP Seen in ER at [facility] on 04/16/23 for facial numbness, BP was elevated 159/87. Her labs were overall reassuring.  Imaging was done with no acute findings on MRI brain, there was an incidental finding of a small left occipital convexity 1 cm dural based lesion most compatible with a meningioma -- she states this has been present since she lived in [place]. MRA neck was overall reassuring with no stenosis or occlusion.  Prior to ER had been eating and her face began to feel numb, had ate nothing new.  Her vision became blurry and she felt dizzy, reports had headache but was not too bad.  She had recently added new product to her cherry drink recently - coffee.  No other changes at home recently.  When she same home from ER she took some apple cider vinegar and this helped her blood pressure + symptoms.  BP at home has been <130/80.  Does continue on Gabapentin at night for back comfort -- taking 300 MG at night.  States having some neck pain still and some numbness down arms -- she did have imaging in 2021 that noted moderate multilevel cervical degenerative disc disease most prominent at C5-C6.   Time since discharge: 2 days Hospital/facility: [facility] Diagnosis: facial numbness Procedures/tests: MRI, MRA, and labs Consultants: none New medications: none Discharge instructions:  Follow-up with PCP Status:  improving    Relevant past medical, surgical, family and social history reviewed and updated as indicated. Interim medical history since our last  visit reviewed. Allergies and medications reviewed and updated.  Review of Systems  Constitutional:  Negative for activity change, appetite change, diaphoresis, fatigue and fever.  Respiratory:  Negative for cough, chest tightness, shortness of breath and wheezing.   Cardiovascular:  Negative for chest pain, palpitations and leg swelling.  Gastrointestinal: Negative.   Endocrine: Negative for cold intolerance and heat intolerance.  Musculoskeletal:  Positive for neck pain.  Neurological:  Positive for dizziness (occasional, improved) and numbness (from neck area to arms). Negative for tremors, syncope, facial asymmetry, speech difficulty, weakness, light-headedness and headaches.  Psychiatric/Behavioral: Negative.      Per HPI unless specifically indicated above     Objective:    BP 114/77   Pulse 78   Temp 98.4 F (36.9 C) (Oral)   Ht 4' 9.99" (1.473 m)   Wt 115 lb (52.2 kg)   LMP  (LMP Unknown)   SpO2 99%   BMI 24.04 kg/m   Wt Readings from Last 3 Encounters:  04/18/23 115 lb (52.2 kg)  01/30/23 114 lb (51.7 kg)  11/02/22 114 lb (51.7 kg)    Physical Exam Vitals and nursing note reviewed.  Constitutional:      General: She is awake. She is not in acute distress.    Appearance: She is well-developed and well-groomed. She is not ill-appearing or toxic-appearing.  HENT:  Head: Normocephalic.     Right Ear: Hearing and external ear normal.     Left Ear: Hearing and external ear normal.  Eyes:     General: Lids are normal.        Right eye: No discharge.        Left eye: No discharge.     Extraocular Movements: Extraocular movements intact.     Conjunctiva/sclera: Conjunctivae normal.     Pupils: Pupils are equal, round, and reactive to light.     Visual Fields: Right eye visual fields normal and left eye visual fields normal.  Neck:     Thyroid: No thyromegaly.     Vascular: No carotid bruit.  Cardiovascular:     Rate and Rhythm: Normal rate and regular rhythm.      Heart sounds: Normal heart sounds. No murmur heard.    No gallop.  Pulmonary:     Effort: Pulmonary effort is normal. No accessory muscle usage or respiratory distress.     Breath sounds: Normal breath sounds.  Abdominal:     General: Bowel sounds are normal. There is no distension.     Palpations: Abdomen is soft.     Tenderness: There is no abdominal tenderness.  Musculoskeletal:     Cervical back: Normal range of motion and neck supple. No edema or crepitus. Pain with movement (mild to lateral left) and muscular tenderness present. No spinous process tenderness. Normal range of motion.     Right lower leg: No edema.     Left lower leg: No edema.  Lymphadenopathy:     Cervical: No cervical adenopathy.  Skin:    General: Skin is warm and dry.  Neurological:     Mental Status: She is alert and oriented to person, place, and time.     Cranial Nerves: Cranial nerves 2-12 are intact.     Sensory: Sensation is intact.     Motor: Motor function is intact.     Coordination: Coordination is intact.     Gait: Gait is intact.     Deep Tendon Reflexes: Reflexes are normal and symmetric.     Reflex Scores:      Brachioradialis reflexes are 2+ on the right side and 2+ on the left side.      Patellar reflexes are 2+ on the right side and 2+ on the left side. Psychiatric:        Attention and Perception: Attention normal.        Mood and Affect: Mood normal.        Speech: Speech normal.        Behavior: Behavior normal. Behavior is cooperative.        Thought Content: Thought content normal.    Results for orders placed or performed in visit on 01/30/23  T4, free  Result Value Ref Range   Free T4 1.32 0.82 - 1.77 ng/dL  CBC with Differential/Platelet  Result Value Ref Range   WBC 10.3 3.4 - 10.8 x10E3/uL   RBC 5.14 3.77 - 5.28 x10E6/uL   Hemoglobin 12.9 11.1 - 15.9 g/dL   Hematocrit 95.6 21.3 - 46.6 %   MCV 77 (L) 79 - 97 fL   MCH 25.1 (L) 26.6 - 33.0 pg   MCHC 32.5 31.5 -  35.7 g/dL   RDW 08.6 57.8 - 46.9 %   Platelets 287 150 - 450 x10E3/uL   Neutrophils 53 Not Estab. %   Lymphs 37 Not Estab. %   Monocytes 7 Not Estab. %  Eos 2 Not Estab. %   Basos 1 Not Estab. %   Neutrophils Absolute 5.5 1.4 - 7.0 x10E3/uL   Lymphocytes Absolute 3.8 (H) 0.7 - 3.1 x10E3/uL   Monocytes Absolute 0.7 0.1 - 0.9 x10E3/uL   EOS (ABSOLUTE) 0.2 0.0 - 0.4 x10E3/uL   Basophils Absolute 0.1 0.0 - 0.2 x10E3/uL   Immature Granulocytes 0 Not Estab. %   Immature Grans (Abs) 0.0 0.0 - 0.1 x10E3/uL  Comprehensive metabolic panel  Result Value Ref Range   Glucose 123 (H) 70 - 99 mg/dL   BUN 17 8 - 27 mg/dL   Creatinine, Ser 4.40 0.57 - 1.00 mg/dL   eGFR 93 >34 VQ/QVZ/5.63   BUN/Creatinine Ratio 24 12 - 28   Sodium 142 134 - 144 mmol/L   Potassium 3.7 3.5 - 5.2 mmol/L   Chloride 103 96 - 106 mmol/L   CO2 25 20 - 29 mmol/L   Calcium 8.9 8.7 - 10.3 mg/dL   Total Protein 7.3 6.0 - 8.5 g/dL   Albumin 4.4 3.9 - 4.9 g/dL   Globulin, Total 2.9 1.5 - 4.5 g/dL   Albumin/Globulin Ratio 1.5 1.2 - 2.2   Bilirubin Total 0.2 0.0 - 1.2 mg/dL   Alkaline Phosphatase 85 44 - 121 IU/L   AST 20 0 - 40 IU/L   ALT 21 0 - 32 IU/L  Lipid Panel w/o Chol/HDL Ratio  Result Value Ref Range   Cholesterol, Total 122 100 - 199 mg/dL   Triglycerides 875 (H) 0 - 149 mg/dL   HDL 47 >64 mg/dL   VLDL Cholesterol Cal 34 5 - 40 mg/dL   LDL Chol Calc (NIH) 41 0 - 99 mg/dL  TSH  Result Value Ref Range   TSH 0.829 0.450 - 4.500 uIU/mL  VITAMIN D 25 Hydroxy (Vit-D Deficiency, Fractures)  Result Value Ref Range   Vit D, 25-Hydroxy 36.7 30.0 - 100.0 ng/mL  Urinalysis, Routine w reflex microscopic  Result Value Ref Range   Specific Gravity, UA 1.010 1.005 - 1.030   pH, UA 6.5 5.0 - 7.5   Color, UA Yellow Yellow   Appearance Ur Clear Clear   Leukocytes,UA Negative Negative   Protein,UA Negative Negative/Trace   Glucose, UA Negative Negative   Ketones, UA Negative Negative   RBC, UA Negative Negative    Bilirubin, UA Negative Negative   Urobilinogen, Ur 0.2 0.2 - 1.0 mg/dL   Nitrite, UA Negative Negative   Microscopic Examination Comment       Assessment & Plan:   Problem List Items Addressed This Visit       Nervous and Auditory   Meningioma (HCC) - Primary    Noted on imaging at [facility] 04/16/23.  She reports this has been present since she lived in [place] many years ago, no records for this to review. Currently her neuro symptoms are improving.  Will place referral for neurology locally to establish care and obtain recommendations for patient on monitoring of area.      Relevant Orders   Ambulatory referral to Neurology     Other   Facial numbness    Improved at this time with overall reassuring work-up in ER.  Referral to neurology placed for further recommendations.      Neck pain    Acute at this time, has chronic neck issues on review.  ?occipital neuralgia at this time vs muscle pain.  Will send in steroid taper for pain and recommend plenty of rest.  May take Tylenol at home  as needed + use heating pad or Voltaren gel.  Had imaging of neck in 2021 noted moderate arthritic changes.  Will plan on repeat imaging if ongoing pain.        Follow up plan: Return in about 4 weeks (around 05/16/2023) for NECK PAIN AND DIZZINESS.

## 2023-04-18 NOTE — Assessment & Plan Note (Signed)
Improved at this time with overall reassuring work-up in ER.  Referral to neurology placed for further recommendations.

## 2023-04-18 NOTE — Assessment & Plan Note (Signed)
Acute at this time, has chronic neck issues on review.  ?occipital neuralgia at this time vs muscle pain.  Will send in steroid taper for pain and recommend plenty of rest.  May take Tylenol at home as needed + use heating pad or Voltaren gel.  Had imaging of neck in 2021 noted moderate arthritic changes.  Will plan on repeat imaging if ongoing pain.

## 2023-04-18 NOTE — Assessment & Plan Note (Addendum)
Noted on imaging at [facility] 04/16/23.  She reports this has been present since she lived in [place] many years ago, no records for this to review. Currently her neuro symptoms are improving.  Will place referral for neurology locally to establish care and obtain recommendations for patient on monitoring of area.

## 2023-04-18 NOTE — Patient Instructions (Signed)
Meningioma  A meningioma is an abnormal growth of cells (tumor) that occurs in the meninges. The meninges are the thin tissues that cover the brain and spinal cord. A meningioma is usually not cancer (benign). In rare cases, a meningioma is cancerous (malignant). What are the causes? This condition may be caused by: Being exposed to ionizing radiation. This type of energy occurs naturally in the environment. It also comes from artificial sources, such as from X-rays and some medical devices. Neurofibromatosis 2. This is a genetic disorder that causes many soft tumors. Genetic mutations. These are changes in certain genes. Many times, the cause of this condition is not known. What increases the risk? The following factors may make you more likely to develop this condition: Being exposed to radiation, especially as a child. Being female. There may be a greater risk linked to having female hormones. Older people who are female have a higher risk of meningiomas than people who are female or children. People who are female have a higher risk of malignant meningiomas. Being obese. What are the signs or symptoms? Common symptoms of this condition include: Headaches. Nausea and vomiting. Vision changes. Hearing changes. Loss of the sense of smell. Other symptoms include: Seizures. Weakness or numbness on one side of the body, or in an arm or a leg. Problems with memory or thinking. Mood or personality changes. Symptoms of this condition usually begin very slowly. The symptoms may depend on the size and location of your tumor. How is this diagnosed? This condition is diagnosed based on: Results of brain imaging tests, such as a CT scan or an MRI. Removing a tissue sample to look at it under a microscope (biopsy). This may be done to confirm the diagnosis and to help determine the best treatment for your condition. How is this treated? This condition may be treated with: Medicines, such as  steroids. These may help lessen brain swelling and and improve your symptoms. Radiation therapy. This uses high-energy rays to shrink or kill your tumor. Surgery to remove as much of your tumor as possible. You may not have treatment until your symptoms start to affect your daily activities. This is because meningiomas grow very slowly. Your health care provider may prefer to watch for growth of your benign tumor before starting treatment. Follow these instructions at home: Take over-the-counter and prescription medicines only as told by your health care provider. Follow instructions from your health care provider about what you may eat and drink. Drink enough fluid to keep your urine pale yellow. Return to your normal activities as told by your health care provider. Ask your health care provider what activities are safe for you. Keep all follow-up visits. You may need regular visits with your health care provider to watch the growth of your tumor. Contact a health care provider if: You have symptoms that come back after treatment. You have headaches. You vomit. You have changes in your vision. You are weaker or more tired than usual. Get help right away if: You have sudden changes in vision. You have a seizure or you are told you have had a seizure. You have new weakness or numbness on one side of your body. Your vomiting does not go away, or you cannot eat or drink without vomiting. You have trouble walking. This information is not intended to replace advice given to you by your health care provider. Make sure you discuss any questions you have with your health care provider. Document Revised: 03/19/2022 Document Reviewed:   03/19/2022 Elsevier Patient Education  2023 [employer]  

## 2023-05-16 ENCOUNTER — Ambulatory Visit: Payer: 59 | Admitting: Nurse Practitioner

## 2023-05-19 NOTE — Patient Instructions (Incomplete)
Cervical Radiculopathy  Cervical radiculopathy happens when a nerve in the neck (a cervical nerve) is pinched or bruised. This condition can happen because of an injury to the cervical spine (vertebrae) in the neck, or as part of the normal aging process. Pressure on the cervical nerves can cause pain or numbness that travels from the neck all the way down to the arm and fingers. This condition usually gets better with rest. Treatment may be needed if the condition does not improve. What are the causes? This condition may be caused by: A neck injury. A bulging (herniated) disk. Muscle spasms. Muscle tightness in the neck due to overuse. Arthritis. Breakdown or degeneration in the bones and joints of the spine (spondylosis) due to aging. Bone spurs that may develop near the cervical nerves. What are the signs or symptoms? Symptoms of this condition include: Pain. The pain may travel from the neck to the arm and hand. The pain can be severe or irritating. It may get worse when you move your neck. Numbness or tingling in your arm or hand. Weakness in the affected arm and hand, in severe cases. How is this diagnosed? This condition may be diagnosed based on your symptoms, your medical history, and a physical exam. You may also have tests, including: X-rays. CT scan. MRI. Electromyogram (EMG). Nerve conduction tests. How is this treated? In many cases, treatment is not needed for this condition. With rest, the condition usually gets better over time. If treatment is needed, options may include: Wearing a soft neck collar (cervical collar) for short periods of time. Doing physical therapy to strengthen your neck muscles. Taking medicines. These may include NSAIDs, such as ibuprofen, or oral corticosteroids. Having spinal injections, in severe cases. Having surgery. This may be needed if other treatments do not help. Different types of surgery may be done depending on the cause of this  condition. Follow these instructions at home: If you have a cervical collar: Wear it as told by your health care provider. Remove it only as told by your health care provider. Ask your health care provider if you can remove the cervical collar for cleaning and bathing. If you are allowed to remove the collar for cleaning or bathing: Follow instructions from your health care provider about how to remove the collar safely. Clean the collar by wiping it with mild soap and water and drying it completely. Take out any removable pads in the collar every 1-2 days, and wash them by hand with soap and water. Let them air-dry completely before you put them back in the collar. Check your skin under the collar for irritation or sores. If you see any, tell your health care provider. Managing pain     Take over-the-counter and prescription medicines only as told by your health care provider. If directed, put ice on the affected area. To do this: If you have a soft neck collar, remove it as told by your health care provider. Put ice in a plastic bag. Place a towel between your skin and the bag. Leave the ice on for 20 minutes, 2-3 times a day. Remove the ice if your skin turns bright red. This is very important. If you cannot feel pain, heat, or cold, you have a greater risk of damage to the area. If applying ice does not help, you can try using heat. Use the heat source that your health care provider recommends, such as a moist heat pack or a heating pad. Place a towel between   your skin and the heat source. Leave the heat on for 20-30 minutes. Remove the heat if your skin turns bright red. This is especially important if you are unable to feel pain, heat, or cold. You have a greater risk of getting burned. Try a gentle neck and shoulder massage to help relieve symptoms. Activity Rest as needed. Return to your normal activities as told by your health care provider. Ask your health care provider what  activities are safe for you. Do stretching and strengthening exercises as told by your health care provider or your physical therapist. You may have to avoid lifting. Ask your health care provider how much you can safely lift. General instructions Use a flat pillow when you sleep. Do not drive while wearing a cervical collar. If you do not have a cervical collar, ask your health care provider if it is safe to drive while your neck heals. Ask your health care provider if the medicine prescribed to you requires you to avoid driving or using machinery. Do not use any products that contain nicotine or tobacco. These products include cigarettes, chewing tobacco, and vaping devices, such as e-cigarettes. If you need help quitting, ask your health care provider. Keep all follow-up visits. This is important. Contact a health care provider if: Your condition does not improve with treatment. Get help right away if: Your pain gets much worse and is not controlled with medicines. You have weakness or numbness in your hand, arm, face, or leg. You have a high fever. You have a stiff, rigid neck. You lose control of your bowels or your bladder (have incontinence). You have trouble with walking, balance, or speaking. Summary Cervical radiculopathy happens when a nerve in the neck is pinched or bruised. A nerve can get pinched from a bulging disk, arthritis, muscle spasms, or an injury to the neck. Symptoms include pain, tingling, or numbness radiating from the neck to the arm or hand. Weakness can also occur in severe cases. Treatment may include rest, wearing a cervical collar, and physical therapy. Medicines may be prescribed to help with pain. In severe cases, injections or surgery may be needed. This information is not intended to replace advice given to you by your health care provider. Make sure you discuss any questions you have with your health care provider. Document Revised: 05/18/2021 Document  Reviewed: 05/18/2021 Elsevier Patient Education  2024 Elsevier Inc.  

## 2023-05-20 ENCOUNTER — Ambulatory Visit (INDEPENDENT_AMBULATORY_CARE_PROVIDER_SITE_OTHER): Payer: 59 | Admitting: Nurse Practitioner

## 2023-05-20 ENCOUNTER — Encounter: Payer: Self-pay | Admitting: Nurse Practitioner

## 2023-05-20 VITALS — BP 128/84 | HR 77 | Temp 98.6°F | Ht <= 58 in | Wt 118.6 lb

## 2023-05-20 DIAGNOSIS — M542 Cervicalgia: Secondary | ICD-10-CM

## 2023-05-20 NOTE — Progress Notes (Signed)
BP 128/84   Pulse 77   Temp 98.6 F (37 C) (Oral)   Ht 4' 9.99" (1.473 m)   Wt 118 lb 9.6 oz (53.8 kg)   LMP  (LMP Unknown)   SpO2 98%   BMI 24.79 kg/m    Subjective:    Patient ID: Katherine [name], female    DOB: [birth year], 67 y.o.   MRN: [id number]  HPI: Katherine [name] is a 67 y.o. female  Chief Complaint  Patient presents with   Neck Pain    F/u on neck pain, patient states that the pain Is a little better    NECK PAIN FOLLOW UP Was seen ER at [facility] on 04/16/23 for facial numbness. Imaging was done with no acute findings on MRI brain, there was an incidental finding of a small left occipital convexity 1 cm dural based lesion most compatible with a meningioma -- she states this has been present since she lived in [place]. MRA neck was overall reassuring with no stenosis or occlusion.  Does continue on Gabapentin at night for back comfort -- taking 300 MG at night.  Had neck pain during ER visit, which she reports is improving (Prednisone given last visit) -- she did have imaging in 2021 that noted moderate multilevel cervical degenerative disc disease most prominent at C5-C6.  A referral was placed last visit to neurology for ongoing recommendations for meningioma. She has not heard from them per report.    She reports a little ear pressure to right side and would like ear looked at today.  Continues on Zyrtec daily. Status: resolved Treatments attempted: Gabapentin  Compliant with recommended treatment: yes Relief with NSAIDs?:  No NSAIDs Taken Location:midline Duration:months Severity: mild Weakness:  no Paresthesias / decreased sensation:  no  Fevers:  no     05/20/2023    3:36 PM 01/30/2023    1:55 PM 11/29/2021   11:52 AM 05/11/2019    1:50 PM 05/01/2018    4:21 PM  Depression screen PHQ 2/9  Decreased Interest 0 0 0 0 0  Down, Depressed, Hopeless 0 0 0 0 0  PHQ - 2 Score 0 0 0 0 0  Altered sleeping 1 0 1 0   Tired, decreased energy 2 0 3 1   Change in  appetite 1 0 0 0   Feeling bad or failure about yourself  0 0 0 0   Trouble concentrating 0 0 0 0   Moving slowly or fidgety/restless 0 0 0 0   Suicidal thoughts 0 0 0 0   PHQ-9 Score 4 0 4 1   Difficult doing work/chores   Not difficult at all Not difficult at all        05/20/2023    3:37 PM 01/30/2023    1:55 PM 11/29/2021   11:52 AM 05/11/2019    1:50 PM  GAD 7 : Generalized Anxiety Score  Nervous, Anxious, on Edge 0 0 0 0  Control/stop worrying 0 0 0 0  Worry too much - different things 0 0 0 0  Trouble relaxing 0 0 0 3  Restless 0 0 0 3  Easily annoyed or irritable 0 0 0 0  Afraid - awful might happen 0 0 0 0  Total GAD 7 Score 0 0 0 6  Anxiety Difficulty Not difficult at all  Not difficult at all     Relevant past medical, surgical, family and social history reviewed and updated as indicated. Interim medical history since our last  visit reviewed. Allergies and medications reviewed and updated.  Review of Systems  Constitutional:  Negative for activity change, appetite change, diaphoresis, fatigue and fever.  Respiratory:  Negative for cough, chest tightness, shortness of breath and wheezing.   Cardiovascular:  Negative for chest pain, palpitations and leg swelling.  Gastrointestinal: Negative.   Endocrine: Negative for cold intolerance and heat intolerance.  Musculoskeletal:  Positive for neck pain.  Neurological: Negative.   Psychiatric/Behavioral: Negative.     Per HPI unless specifically indicated above     Objective:    BP 128/84   Pulse 77   Temp 98.6 F (37 C) (Oral)   Ht 4' 9.99" (1.473 m)   Wt 118 lb 9.6 oz (53.8 kg)   LMP  (LMP Unknown)   SpO2 98%   BMI 24.79 kg/m   Wt Readings from Last 3 Encounters:  05/20/23 118 lb 9.6 oz (53.8 kg)  04/18/23 115 lb (52.2 kg)  01/30/23 114 lb (51.7 kg)    Physical Exam Vitals and nursing note reviewed.  Constitutional:      General: She is awake. She is not in acute distress.    Appearance: She is  well-developed and well-groomed. She is not ill-appearing or toxic-appearing.  HENT:     Head: Normocephalic.     Right Ear: Hearing and external ear normal.     Left Ear: Hearing and external ear normal.  Eyes:     General: Lids are normal.        Right eye: No discharge.        Left eye: No discharge.     Extraocular Movements: Extraocular movements intact.     Conjunctiva/sclera: Conjunctivae normal.     Pupils: Pupils are equal, round, and reactive to light.     Visual Fields: Right eye visual fields normal and left eye visual fields normal.  Neck:     Thyroid: No thyromegaly.     Vascular: No carotid bruit.  Cardiovascular:     Rate and Rhythm: Normal rate and regular rhythm.     Heart sounds: Normal heart sounds. No murmur heard.    No gallop.  Pulmonary:     Effort: Pulmonary effort is normal. No accessory muscle usage or respiratory distress.     Breath sounds: Normal breath sounds.  Abdominal:     General: Bowel sounds are normal. There is no distension.     Palpations: Abdomen is soft.     Tenderness: There is no abdominal tenderness.  Musculoskeletal:     Cervical back: Normal range of motion and neck supple. No edema or crepitus. No pain with movement, spinous process tenderness or muscular tenderness. Normal range of motion.     Right lower leg: No edema.     Left lower leg: No edema.  Lymphadenopathy:     Cervical: No cervical adenopathy.  Skin:    General: Skin is warm and dry.  Neurological:     Mental Status: She is alert and oriented to person, place, and time.     Cranial Nerves: Cranial nerves 2-12 are intact.     Sensory: Sensation is intact.     Motor: Motor function is intact.     Coordination: Coordination is intact.     Gait: Gait is intact.     Deep Tendon Reflexes: Reflexes are normal and symmetric.     Reflex Scores:      Brachioradialis reflexes are 2+ on the right side and 2+ on the left side.  Patellar reflexes are 2+ on the right side  and 2+ on the left side. Psychiatric:        Attention and Perception: Attention normal.        Mood and Affect: Mood normal.        Speech: Speech normal.        Behavior: Behavior normal. Behavior is cooperative.        Thought Content: Thought content normal.    Results for orders placed or performed in visit on 01/30/23  T4, free  Result Value Ref Range   Free T4 1.32 0.82 - 1.77 ng/dL  CBC with Differential/Platelet  Result Value Ref Range   WBC 10.3 3.4 - 10.8 x10E3/uL   RBC 5.14 3.77 - 5.28 x10E6/uL   Hemoglobin 12.9 11.1 - 15.9 g/dL   Hematocrit 44.0 10.2 - 46.6 %   MCV 77 (L) 79 - 97 fL   MCH 25.1 (L) 26.6 - 33.0 pg   MCHC 32.5 31.5 - 35.7 g/dL   RDW 72.5 36.6 - 44.0 %   Platelets 287 150 - 450 x10E3/uL   Neutrophils 53 Not Estab. %   Lymphs 37 Not Estab. %   Monocytes 7 Not Estab. %   Eos 2 Not Estab. %   Basos 1 Not Estab. %   Neutrophils Absolute 5.5 1.4 - 7.0 x10E3/uL   Lymphocytes Absolute 3.8 (H) 0.7 - 3.1 x10E3/uL   Monocytes Absolute 0.7 0.1 - 0.9 x10E3/uL   EOS (ABSOLUTE) 0.2 0.0 - 0.4 x10E3/uL   Basophils Absolute 0.1 0.0 - 0.2 x10E3/uL   Immature Granulocytes 0 Not Estab. %   Immature Grans (Abs) 0.0 0.0 - 0.1 x10E3/uL  Comprehensive metabolic panel  Result Value Ref Range   Glucose 123 (H) 70 - 99 mg/dL   BUN 17 8 - 27 mg/dL   Creatinine, Ser 3.47 0.57 - 1.00 mg/dL   eGFR 93 >42 VZ/DGL/8.75   BUN/Creatinine Ratio 24 12 - 28   Sodium 142 134 - 144 mmol/L   Potassium 3.7 3.5 - 5.2 mmol/L   Chloride 103 96 - 106 mmol/L   CO2 25 20 - 29 mmol/L   Calcium 8.9 8.7 - 10.3 mg/dL   Total Protein 7.3 6.0 - 8.5 g/dL   Albumin 4.4 3.9 - 4.9 g/dL   Globulin, Total 2.9 1.5 - 4.5 g/dL   Albumin/Globulin Ratio 1.5 1.2 - 2.2   Bilirubin Total 0.2 0.0 - 1.2 mg/dL   Alkaline Phosphatase 85 44 - 121 IU/L   AST 20 0 - 40 IU/L   ALT 21 0 - 32 IU/L  Lipid Panel w/o Chol/HDL Ratio  Result Value Ref Range   Cholesterol, Total 122 100 - 199 mg/dL   Triglycerides  643 (H) 0 - 149 mg/dL   HDL 47 >32 mg/dL   VLDL Cholesterol Cal 34 5 - 40 mg/dL   LDL Chol Calc (NIH) 41 0 - 99 mg/dL  TSH  Result Value Ref Range   TSH 0.829 0.450 - 4.500 uIU/mL  VITAMIN D 25 Hydroxy (Vit-D Deficiency, Fractures)  Result Value Ref Range   Vit D, 25-Hydroxy 36.7 30.0 - 100.0 ng/mL  Urinalysis, Routine w reflex microscopic  Result Value Ref Range   Specific Gravity, UA 1.010 1.005 - 1.030   pH, UA 6.5 5.0 - 7.5   Color, UA Yellow Yellow   Appearance Ur Clear Clear   Leukocytes,UA Negative Negative   Protein,UA Negative Negative/Trace   Glucose, UA Negative Negative   Ketones, UA Negative  Negative   RBC, UA Negative Negative   Bilirubin, UA Negative Negative   Urobilinogen, Ur 0.2 0.2 - 1.0 mg/dL   Nitrite, UA Negative Negative   Microscopic Examination Comment       Assessment & Plan:   Problem List Items Addressed This Visit       Other   Neck pain - Primary    Has chronic neck issues at baseline, but acute symptoms improved.  May take Tylenol at home as needed + use heating pad or Voltaren gel.  Had imaging of neck in 2021 noted moderate arthritic changes.  Continue Gabapentin as ordered.        Follow up plan: Return for as scheduled in September for follow-up.

## 2023-05-20 NOTE — Assessment & Plan Note (Signed)
Has chronic neck issues at baseline, but acute symptoms improved.  May take Tylenol at home as needed + use heating pad or Voltaren gel.  Had imaging of neck in 2021 noted moderate arthritic changes.  Continue Gabapentin as ordered.

## 2023-06-27 DIAGNOSIS — D329 Benign neoplasm of meninges, unspecified: Secondary | ICD-10-CM | POA: Diagnosis not present

## 2023-06-27 DIAGNOSIS — R202 Paresthesia of skin: Secondary | ICD-10-CM | POA: Diagnosis not present

## 2023-06-27 DIAGNOSIS — G479 Sleep disorder, unspecified: Secondary | ICD-10-CM | POA: Diagnosis not present

## 2023-06-27 DIAGNOSIS — M542 Cervicalgia: Secondary | ICD-10-CM | POA: Diagnosis not present

## 2023-06-27 DIAGNOSIS — R200 Anesthesia of skin: Secondary | ICD-10-CM | POA: Diagnosis not present

## 2023-06-27 DIAGNOSIS — R4586 Emotional lability: Secondary | ICD-10-CM | POA: Diagnosis not present

## 2023-07-12 ENCOUNTER — Other Ambulatory Visit: Payer: Self-pay | Admitting: Nurse Practitioner

## 2023-07-12 DIAGNOSIS — E039 Hypothyroidism, unspecified: Secondary | ICD-10-CM

## 2023-07-15 NOTE — Telephone Encounter (Signed)
Last RF 01/30/23 #90 4 RF  Requested Prescriptions  Refused Prescriptions Disp Refills   levothyroxine (SYNTHROID) 50 MCG tablet [Pharmacy Med Name: LEVOTHYROXINE 50 MCG TABLET] 90 tablet 4    Sig: TAKE 1 TABLET BY MOUTH EVERY DAY     Endocrinology:  Hypothyroid Agents Passed - 07/12/2023  1:03 PM      Passed - TSH in normal range and within 360 days    TSH  Date Value Ref Range Status  01/30/2023 0.829 0.450 - 4.500 uIU/mL Final         Passed - Valid encounter within last 12 months    Recent Outpatient Visits           1 month ago Neck pain   [place] [facility] Bee, [place] T, NP   2 months ago Meningioma [facility])   [place] [facility] Bryant, Corrie Dandy T, NP   5 months ago Hypothyroidism, unspecified type   [place] [facility] Eldridge, Corrie Dandy T, NP   8 months ago Chronic left hip pain   [place] [facility] Guy, Corrie Dandy T, NP   1 year ago Acute hip pain, left   [place] [facility] [place], Dorie Rank, NP       Future Appointments             In 2 weeks Cannady, Dorie Rank, NP  [facility]

## 2023-07-29 NOTE — Patient Instructions (Signed)
Be Involved in Caring For Your Health:  Taking Medications When medications are taken as directed, they can greatly improve your health. But if they are not taken as prescribed, they may not work. In some cases, not taking them correctly can be harmful. To help ensure your treatment remains effective and safe, understand your medications and how to take them. Bring your medications to each visit for review by your provider.  Your lab results, notes, and after visit summary will be available on My Chart. We strongly encourage you to use this feature. If lab results are abnormal the clinic will contact you with the appropriate steps. If the clinic does not contact you assume the results are satisfactory. You can always view your results on My Chart. If you have questions regarding your health or results, please contact the clinic during office hours. You can also ask questions on My Chart.  We at [facility] are grateful that you chose [place] to provide your care. We strive to provide evidence-based and compassionate care and are always looking for feedback. If you get a survey from the clinic please complete this so we can hear your opinions.  Cervical Radiculopathy  Cervical radiculopathy means that a nerve in the neck (a cervical nerve) is pinched or bruised. This can happen because of an injury to the cervical spine (vertebrae) in the neck, or as a normal part of getting older. This condition can cause pain or loss of feeling (numbness) that runs from your neck all the way down to your arm and fingers. Often, this condition gets better with rest. Treatment may be needed if the condition does not get better. What are the causes? A neck injury. A bulging disk in your spine. Sudden muscle tightening (muscle spasms). Tight muscles in your neck due to overuse. Arthritis. Breakdown in the bones and joints of the spine (spondylosis) due to getting older. Bone spurs that form near the nerves in  the neck. What are the signs or symptoms? Pain. The pain may: Run from the neck to the arm and hand. Be very bad or irritating. Get worse when you move your neck. Loss of feeling or tingling in your arm or hand. Weakness in your arm or hand, in very bad cases. How is this treated? In many cases, treatment is not needed for this condition. With rest, the condition often gets better over time. If treatment is needed, options may include: Wearing a soft neck collar (cervical collar) for short periods of time. Doing exercises (physical therapy) to strengthen your neck muscles. Taking medicines. Having shots (injections) in your spine, in very bad cases. Having surgery. This may be needed if other treatments do not help. The type of surgery that is used will depend on the cause of your condition. Follow these instructions at home: If you have a soft neck collar: Wear it as told by your doctor. Take it off only as told by your doctor. Ask your doctor if you can take the collar off for cleaning and bathing. If you are allowed to take the collar off for cleaning or bathing: Follow instructions from your doctor about how to take off the collar safely. Clean the collar by wiping it with mild soap and water and drying it completely. Take out any removable pads in the collar every 1-2 days. Wash them by hand with soap and water. Let them air-dry completely before you put them back in the collar. Check your skin under the collar for  redness or sores. If you see any, tell your doctor. Managing pain     Take over-the-counter and prescription medicines only as told by your doctor. If told, put ice on the painful area. To do this: If you have a soft neck collar, take if off as told by your doctor. Put ice in a plastic bag. Place a towel between your skin and the bag. Leave the ice on for 20 minutes, 2-3 times a day. Take off the ice if your skin turns bright red. This is very important. If you cannot  feel pain, heat, or cold, you have a greater risk of damage to the area. If using ice does not help, you can try using heat. Use the heat source that your doctor recommends, such as a moist heat pack or a heating pad. Place a towel between your skin and the heat source. Leave the heat on for 20-30 minutes. Take off the heat if your skin turns bright red. This is very important. If you cannot feel pain, heat, or cold, you have a greater risk of getting burned. You may try a gentle neck and shoulder rub (massage). Activity Rest as needed. Return to your normal activities when your doctor says that it is safe. Do exercises as told by your doctor or physical therapist. You may have to avoid lifting. Ask your doctor how much you can safely lift. General instructions Use a flat pillow when you sleep. Do not drive while wearing a soft neck collar. If you do not have a soft neck collar, ask your doctor if it is safe to drive while your neck heals. Ask your doctor if you should avoid driving or using machines while you are taking your medicine. Do not smoke or use any products that contain nicotine or tobacco. If you need help quitting, ask your doctor. Keep all follow-up visits. Contact a doctor if: Your condition does not get better with treatment. Get help right away if: Your pain gets worse and medicine does not help. You lose feeling or feel weak in your hand, arm, face, or leg. You have a high fever. Your neck is stiff. You cannot control when you poop or pee (have incontinence). You have trouble with walking, balance, or talking. Summary Cervical radiculopathy means that a nerve in the neck is pinched or bruised. A nerve can get pinched from a bulging disk, arthritis, an injury to the neck, or other causes. Symptoms include pain, tingling, or loss of feeling that goes from the neck to the arm or hand. Weakness in your arm or hand can happen in very bad cases. Treatment may include  resting, wearing a soft neck collar, and doing exercises. You might need to take medicines for pain. In very bad cases, shots or surgery may be needed. This information is not intended to replace advice given to you by your health care provider. Make sure you discuss any questions you have with your health care provider. Document Revised: 05/18/2021 Document Reviewed: 05/18/2021 Elsevier Patient Education  2024 [employer].

## 2023-08-02 ENCOUNTER — Ambulatory Visit (INDEPENDENT_AMBULATORY_CARE_PROVIDER_SITE_OTHER): Payer: 59 | Admitting: Nurse Practitioner

## 2023-08-02 ENCOUNTER — Encounter: Payer: Self-pay | Admitting: Nurse Practitioner

## 2023-08-02 VITALS — BP 133/84 | HR 85 | Temp 98.1°F | Wt 117.2 lb

## 2023-08-02 DIAGNOSIS — E782 Mixed hyperlipidemia: Secondary | ICD-10-CM | POA: Diagnosis not present

## 2023-08-02 DIAGNOSIS — E039 Hypothyroidism, unspecified: Secondary | ICD-10-CM

## 2023-08-02 DIAGNOSIS — Z23 Encounter for immunization: Secondary | ICD-10-CM

## 2023-08-02 DIAGNOSIS — J4520 Mild intermittent asthma, uncomplicated: Secondary | ICD-10-CM

## 2023-08-02 DIAGNOSIS — M542 Cervicalgia: Secondary | ICD-10-CM | POA: Diagnosis not present

## 2023-08-02 DIAGNOSIS — D329 Benign neoplasm of meninges, unspecified: Secondary | ICD-10-CM

## 2023-08-02 NOTE — Assessment & Plan Note (Addendum)
Chronic, ongoing.  Continue current medication regimen and adjust as needed.  Labs today. 

## 2023-08-02 NOTE — Assessment & Plan Note (Signed)
Noted on imaging at [facility] 04/16/23.  She reports this has been present since she lived in [place] many years ago, no records for this to review. Currently no neuro symptoms.  She saw neurology, recent note reviewed.  Reports she will only return there as needed.

## 2023-08-02 NOTE — Assessment & Plan Note (Signed)
Has chronic neck issues at baseline.  May take Tylenol at home as needed + use heating pad or Voltaren gel.  Had imaging of neck in 2021 noted moderate arthritic changes.  Continue Gabapentin as ordered.

## 2023-08-02 NOTE — Progress Notes (Signed)
BP 133/84 (BP Location: Left Arm, Cuff Size: Normal)   Pulse 85   Temp 98.1 F (36.7 C) (Oral)   Wt 117 lb 3.2 oz (53.2 kg)   LMP  (LMP Unknown)   SpO2 98%   BMI 24.50 kg/m    Subjective:    Patient ID: Katherine [name], female    DOB: 06/29/1956, 67 y.o.   MRN: [id number]  HPI: Katherine [name] is a 67 y.o. female  Chief Complaint  Patient presents with   Hyperlipidemia   Hypothyroidism   Had a root canal yesterday and is having some pain from this and did not sleep well last night. Has been taking Ibuprofen.  HYPERLIPIDEMIA Continues Crestor daily. Hyperlipidemia status: good compliance Satisfied with current treatment?  yes Side effects:  no Medication compliance: good compliance Past cholesterol meds: Crestor Supplements: none Aspirin:  no The ASCVD Risk score (Arnett DK, et al., 2019) failed to calculate for the following reasons:   The patient has a prior MI or stroke diagnosis Chest pain:  no Coronary artery disease:  no Family history CAD:  no Family history early CAD:  no   ASTHMA Has Albuterol inhaler at home, barely uses this -- uses more in winter with cold weather.  Takes Zyrtec for allergies. Asthma status: stable Satisfied with current treatment?: yes Albuterol/rescue inhaler frequency:  Dyspnea frequency: no Wheezing frequency: no Cough frequency: no Nocturnal symptom frequency: no Limitation of activity: no Current upper respiratory symptoms: no Triggers: pollen Aerochamber/spacer use: no Visits to ER or Urgent Care in past year: no Pneumovax: Not up to Date Influenza: Not up to Date   HYPOTHYROIDISM  Current Levothyroxine dose is 50 MCG.   Thyroid control status:stable  Satisfied with current treatment? yes  Medication side effects: no  Medication compliance: good compliance  Etiology of hypothyroidism:  Recent dose adjustment:no  Fatigue: none Cold intolerance: none Heat intolerance: none Weight gain: none Weight loss:  none Constipation:none Diarrhea/loose stools: none Palpitations: none Lower extremity edema: none Anxiety/depressed mood: none    08/02/2023   10:56 AM 05/20/2023    3:36 PM 01/30/2023    1:55 PM 11/29/2021   11:52 AM 05/11/2019    1:50 PM  Depression screen PHQ 2/9  Decreased Interest 0 0 0 0 0  Down, Depressed, Hopeless 0 0 0 0 0  PHQ - 2 Score 0 0 0 0 0  Altered sleeping 0 1 0 1 0  Tired, decreased energy 0 2 0 3 1  Change in appetite 0 1 0 0 0  Feeling bad or failure about yourself  0 0 0 0 0  Trouble concentrating 0 0 0 0 0  Moving slowly or fidgety/restless 0 0 0 0 0  Suicidal thoughts 0 0 0 0 0  PHQ-9 Score 0 4 0 4 1  Difficult doing work/chores Not difficult at all   Not difficult at all Not difficult at all       08/02/2023   10:56 AM 05/20/2023    3:37 PM 01/30/2023    1:55 PM 11/29/2021   11:52 AM  GAD 7 : Generalized Anxiety Score  Nervous, Anxious, on Edge 0 0 0 0  Control/stop worrying 0 0 0 0  Worry too much - different things 0 0 0 0  Trouble relaxing 0 0 0 0  Restless 0 0 0 0  Easily annoyed or irritable 0 0 0 0  Afraid - awful might happen 0 0 0 0  Total GAD 7 Score 0  0 0 0  Anxiety Difficulty Not difficult at all Not difficult at all  Not difficult at all      NECK PAIN/MENINGIOMA Imaging was done in ER 04/16/23 with no acute findings on MRI brain, there was an incidental finding of a small left occipital convexity 1 cm dural based lesion most compatible with a meningioma -- this has been present since she lived in [place] per patient. MRA neck was overall reassuring with no stenosis or occlusion. Did have imaging in 2021 that noted moderate multilevel cervical degenerative disc disease most prominent at C5-C6.  Does continue on Gabapentin at night for back comfort -- taking 300 MG at night. Saw neurology on on 06/27/23, but reports she will not return as no benefit. Status: stable Treatments attempted: Gabapentin  Compliant with recommended treatment:  yes Relief with NSAIDs?:  No NSAIDs Taken Location:midline Duration:months Severity: mild Weakness:  no Paresthesias / decreased sensation:  no  Fevers:  no   Relevant past medical, surgical, family and social history reviewed and updated as indicated. Interim medical history since our last visit reviewed. Allergies and medications reviewed and updated.  Review of Systems  Constitutional:  Negative for activity change, appetite change, diaphoresis, fatigue and fever.  Respiratory:  Negative for cough, chest tightness, shortness of breath and wheezing.   Cardiovascular:  Negative for chest pain, palpitations and leg swelling.  Gastrointestinal: Negative.   Endocrine: Negative for cold intolerance and heat intolerance.  Musculoskeletal:  Positive for neck pain (occasional).  Neurological: Negative.   Psychiatric/Behavioral: Negative.      Per HPI unless specifically indicated above     Objective:    BP 133/84 (BP Location: Left Arm, Cuff Size: Normal)   Pulse 85   Temp 98.1 F (36.7 C) (Oral)   Wt 117 lb 3.2 oz (53.2 kg)   LMP  (LMP Unknown)   SpO2 98%   BMI 24.50 kg/m   Wt Readings from Last 3 Encounters:  08/02/23 117 lb 3.2 oz (53.2 kg)  05/20/23 118 lb 9.6 oz (53.8 kg)  04/18/23 115 lb (52.2 kg)    Physical Exam Vitals and nursing note reviewed.  Constitutional:      General: She is awake. She is not in acute distress.    Appearance: She is well-developed and well-groomed. She is not ill-appearing or toxic-appearing.  HENT:     Head: Normocephalic.     Right Ear: Hearing and external ear normal.     Left Ear: Hearing and external ear normal.  Eyes:     General: Lids are normal.        Right eye: No discharge.        Left eye: No discharge.     Extraocular Movements: Extraocular movements intact.     Conjunctiva/sclera: Conjunctivae normal.     Pupils: Pupils are equal, round, and reactive to light.     Visual Fields: Right eye visual fields normal and left  eye visual fields normal.  Neck:     Thyroid: No thyromegaly.     Vascular: No carotid bruit.  Cardiovascular:     Rate and Rhythm: Normal rate and regular rhythm.     Heart sounds: Normal heart sounds. No murmur heard.    No gallop.  Pulmonary:     Effort: Pulmonary effort is normal. No accessory muscle usage or respiratory distress.     Breath sounds: Normal breath sounds.  Abdominal:     General: Bowel sounds are normal. There is no distension.  Palpations: Abdomen is soft.     Tenderness: There is no abdominal tenderness.  Musculoskeletal:     Cervical back: Normal range of motion and neck supple. No edema or crepitus. Muscular tenderness present. No pain with movement or spinous process tenderness. Normal range of motion.     Right lower leg: No edema.     Left lower leg: No edema.  Lymphadenopathy:     Cervical: No cervical adenopathy.  Skin:    General: Skin is warm and dry.  Neurological:     Mental Status: She is alert and oriented to person, place, and time.     Cranial Nerves: Cranial nerves 2-12 are intact.     Sensory: Sensation is intact.     Motor: Motor function is intact.     Coordination: Coordination is intact.     Gait: Gait is intact.     Deep Tendon Reflexes: Reflexes are normal and symmetric.     Reflex Scores:      Brachioradialis reflexes are 2+ on the right side and 2+ on the left side.      Patellar reflexes are 2+ on the right side and 2+ on the left side. Psychiatric:        Attention and Perception: Attention normal.        Mood and Affect: Mood normal.        Speech: Speech normal.        Behavior: Behavior normal. Behavior is cooperative.        Thought Content: Thought content normal.     Results for orders placed or performed in visit on 01/30/23  T4, free  Result Value Ref Range   Free T4 1.32 0.82 - 1.77 ng/dL  CBC with Differential/Platelet  Result Value Ref Range   WBC 10.3 3.4 - 10.8 x10E3/uL   RBC 5.14 3.77 - 5.28 x10E6/uL    Hemoglobin 12.9 11.1 - 15.9 g/dL   Hematocrit 74.2 59.5 - 46.6 %   MCV 77 (L) 79 - 97 fL   MCH 25.1 (L) 26.6 - 33.0 pg   MCHC 32.5 31.5 - 35.7 g/dL   RDW 63.8 75.6 - 43.3 %   Platelets 287 150 - 450 x10E3/uL   Neutrophils 53 Not Estab. %   Lymphs 37 Not Estab. %   Monocytes 7 Not Estab. %   Eos 2 Not Estab. %   Basos 1 Not Estab. %   Neutrophils Absolute 5.5 1.4 - 7.0 x10E3/uL   Lymphocytes Absolute 3.8 (H) 0.7 - 3.1 x10E3/uL   Monocytes Absolute 0.7 0.1 - 0.9 x10E3/uL   EOS (ABSOLUTE) 0.2 0.0 - 0.4 x10E3/uL   Basophils Absolute 0.1 0.0 - 0.2 x10E3/uL   Immature Granulocytes 0 Not Estab. %   Immature Grans (Abs) 0.0 0.0 - 0.1 x10E3/uL  Comprehensive metabolic panel  Result Value Ref Range   Glucose 123 (H) 70 - 99 mg/dL   BUN 17 8 - 27 mg/dL   Creatinine, Ser 2.95 0.57 - 1.00 mg/dL   eGFR 93 >18 AC/ZYS/0.63   BUN/Creatinine Ratio 24 12 - 28   Sodium 142 134 - 144 mmol/L   Potassium 3.7 3.5 - 5.2 mmol/L   Chloride 103 96 - 106 mmol/L   CO2 25 20 - 29 mmol/L   Calcium 8.9 8.7 - 10.3 mg/dL   Total Protein 7.3 6.0 - 8.5 g/dL   Albumin 4.4 3.9 - 4.9 g/dL   Globulin, Total 2.9 1.5 - 4.5 g/dL   Albumin/Globulin Ratio 1.5 1.2 -  2.2   Bilirubin Total 0.2 0.0 - 1.2 mg/dL   Alkaline Phosphatase 85 44 - 121 IU/L   AST 20 0 - 40 IU/L   ALT 21 0 - 32 IU/L  Lipid Panel w/o Chol/HDL Ratio  Result Value Ref Range   Cholesterol, Total 122 100 - 199 mg/dL   Triglycerides 409 (H) 0 - 149 mg/dL   HDL 47 >81 mg/dL   VLDL Cholesterol Cal 34 5 - 40 mg/dL   LDL Chol Calc (NIH) 41 0 - 99 mg/dL  TSH  Result Value Ref Range   TSH 0.829 0.450 - 4.500 uIU/mL  VITAMIN D 25 Hydroxy (Vit-D Deficiency, Fractures)  Result Value Ref Range   Vit D, 25-Hydroxy 36.7 30.0 - 100.0 ng/mL  Urinalysis, Routine w reflex microscopic  Result Value Ref Range   Specific Gravity, UA 1.010 1.005 - 1.030   pH, UA 6.5 5.0 - 7.5   Color, UA Yellow Yellow   Appearance Ur Clear Clear   Leukocytes,UA Negative  Negative   Protein,UA Negative Negative/Trace   Glucose, UA Negative Negative   Ketones, UA Negative Negative   RBC, UA Negative Negative   Bilirubin, UA Negative Negative   Urobilinogen, Ur 0.2 0.2 - 1.0 mg/dL   Nitrite, UA Negative Negative   Microscopic Examination Comment       Assessment & Plan:   Problem List Items Addressed This Visit       Respiratory   Asthma    Chronic, ongoing, stable with minimal Albuterol use at baseline, unless pollen levels elevated or cold weather.  Continue this regimen and adjust as needed.  Will continue Zyrtec, which she reports works best.        Endocrine   Hypothyroid    Chronic, ongoing.  Continue current medication regimen and adjust as needed.  Assess thyroid labs at physical in 6 months.        Nervous and Auditory   Meningioma [facility]) - Primary    Noted on imaging at [facility] 04/16/23.  She reports this has been present since she lived in [place] many years ago, no records for this to review. Currently no neuro symptoms.  She saw neurology, recent note reviewed.  Reports she will only return there as needed.        Other   Hyperlipidemia    Chronic, ongoing.  Continue current medication regimen and adjust as needed.  Labs today.      Relevant Orders   Comprehensive metabolic panel   Lipid Panel w/o Chol/HDL Ratio   Neck pain    Has chronic neck issues at baseline.  May take Tylenol at home as needed + use heating pad or Voltaren gel.  Had imaging of neck in 2021 noted moderate arthritic changes.  Continue Gabapentin as ordered.      Other Visit Diagnoses     Flu vaccine need       Flu vaccine in office today, educated patient on this.   Relevant Orders   Flu vaccine trivalent PF, 6mos and older(Flulaval,Afluria,Fluarix,Fluzone) (Completed)        Follow up plan: Return in about 6 months (around 01/30/2024) for Annual Physical after 01/30/24.

## 2023-08-02 NOTE — Assessment & Plan Note (Signed)
Chronic, ongoing.  Continue current medication regimen and adjust as needed.  Assess thyroid labs at physical in 6 months.

## 2023-08-02 NOTE — Assessment & Plan Note (Signed)
Chronic, ongoing, stable with minimal Albuterol use at baseline, unless pollen levels elevated or cold weather.  Continue this regimen and adjust as needed.  Will continue Zyrtec, which she reports works best.

## 2023-08-03 LAB — LIPID PANEL W/O CHOL/HDL RATIO
Cholesterol, Total: 153 mg/dL (ref 100–199)
HDL: 51 mg/dL (ref >39)
LDL Chol Calc (NIH): 51 mg/dL (ref 0–99)
Triglycerides: 336 mg/dL — ABNORMAL HIGH (ref 0–149)
VLDL Cholesterol Cal: 51 mg/dL — ABNORMAL HIGH (ref 5–40)

## 2023-08-03 LAB — COMPREHENSIVE METABOLIC PANEL
ALT: 19 IU/L (ref 0–32)
AST: 25 IU/L (ref 0–40)
Albumin: 4.2 g/dL (ref 3.9–4.9)
Alkaline Phosphatase: 100 IU/L (ref 44–121)
BUN/Creatinine Ratio: 19 (ref 12–28)
BUN: 12 mg/dL (ref 8–27)
Bilirubin Total: 0.3 mg/dL (ref 0.0–1.2)
CO2: 20 mmol/L (ref 20–29)
Calcium: 9.4 mg/dL (ref 8.7–10.3)
Chloride: 105 mmol/L (ref 96–106)
Creatinine, Ser: 0.63 mg/dL (ref 0.57–1.00)
Globulin, Total: 3.1 g/dL (ref 1.5–4.5)
Glucose: 105 mg/dL — ABNORMAL HIGH (ref 70–99)
Potassium: 4.2 mmol/L (ref 3.5–5.2)
Sodium: 140 mmol/L (ref 134–144)
Total Protein: 7.3 g/dL (ref 6.0–8.5)
eGFR: 99 mL/min/{1.73_m2} (ref >59)

## 2023-08-03 NOTE — Progress Notes (Signed)
Good morning, please let Katherine [name] know her labs have returned and kidney + liver function remain stable + cholesterol levels remain in good range with exception of mild elevation in triglycerides.  Continue to take Rosuvastatin daily and monitor diet, choosing healthy options.  Any questions? Keep being amazing!!  Thank you for allowing me to participate in your care.  I appreciate you. Kindest regards, [name]

## 2023-08-15 IMAGING — MG MM DIGITAL DIAGNOSTIC UNILAT*L* W/ TOMO W/ CAD
4 series · 4 of 12 positions shown · non-contrast
Comparison: Previous exam(s).

CLINICAL DATA: 65-year-old female presenting as a recall from
screening for possible left breast asymmetry.

EXAM:
DIGITAL DIAGNOSTIC UNILATERAL LEFT MAMMOGRAM WITH TOMOSYNTHESIS AND
CAD
TECHNIQUE: Left digital diagnostic mammography and breast tomosynthesis was
performed. The images were evaluated with computer-aided detection.

[L ML synth-2D]
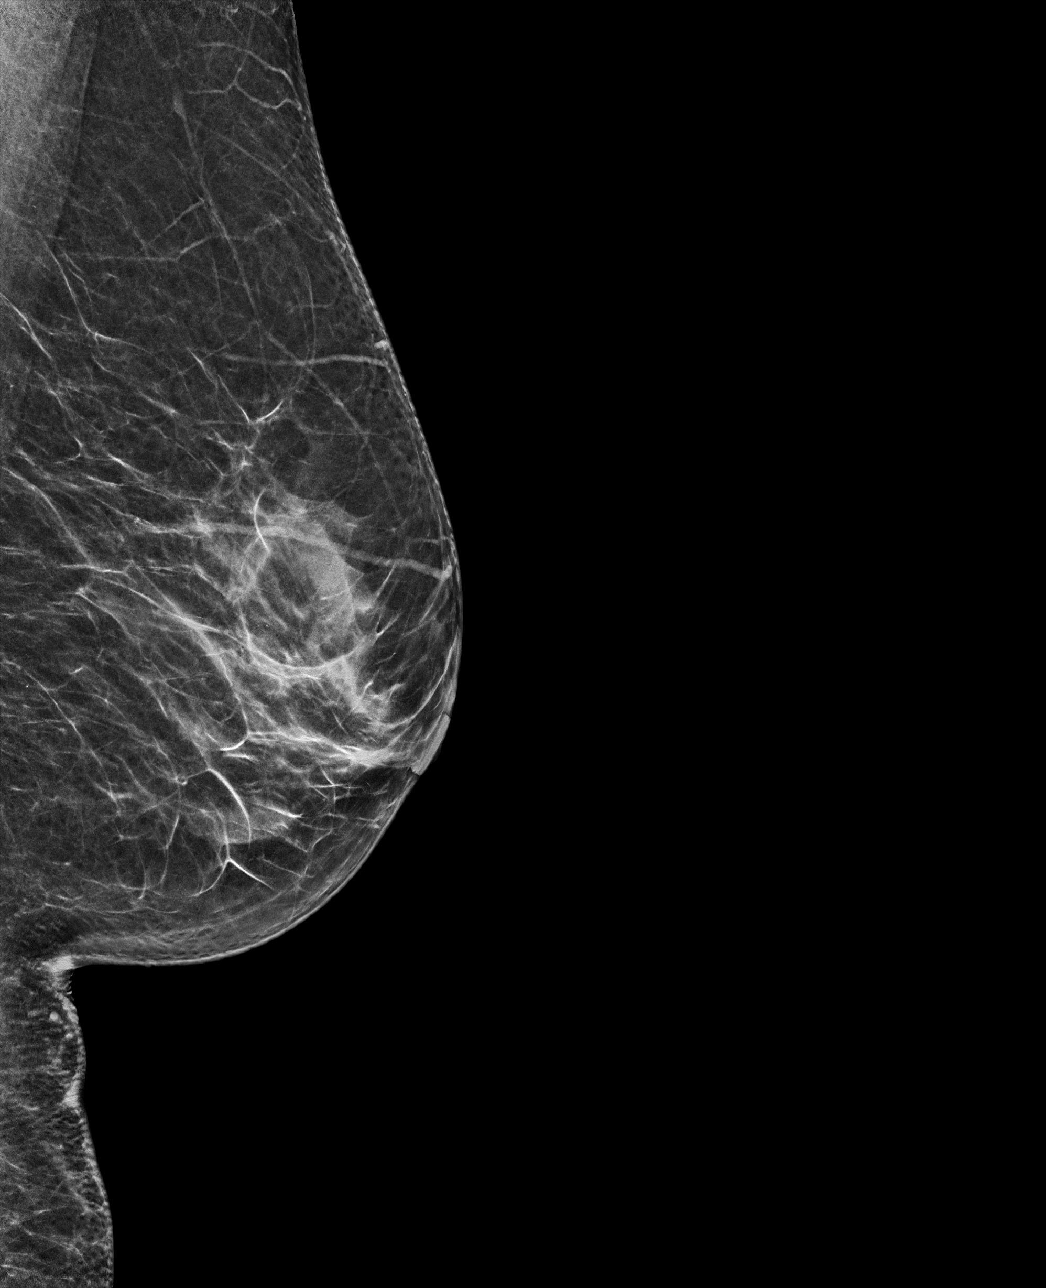

[L CC synth-2D]
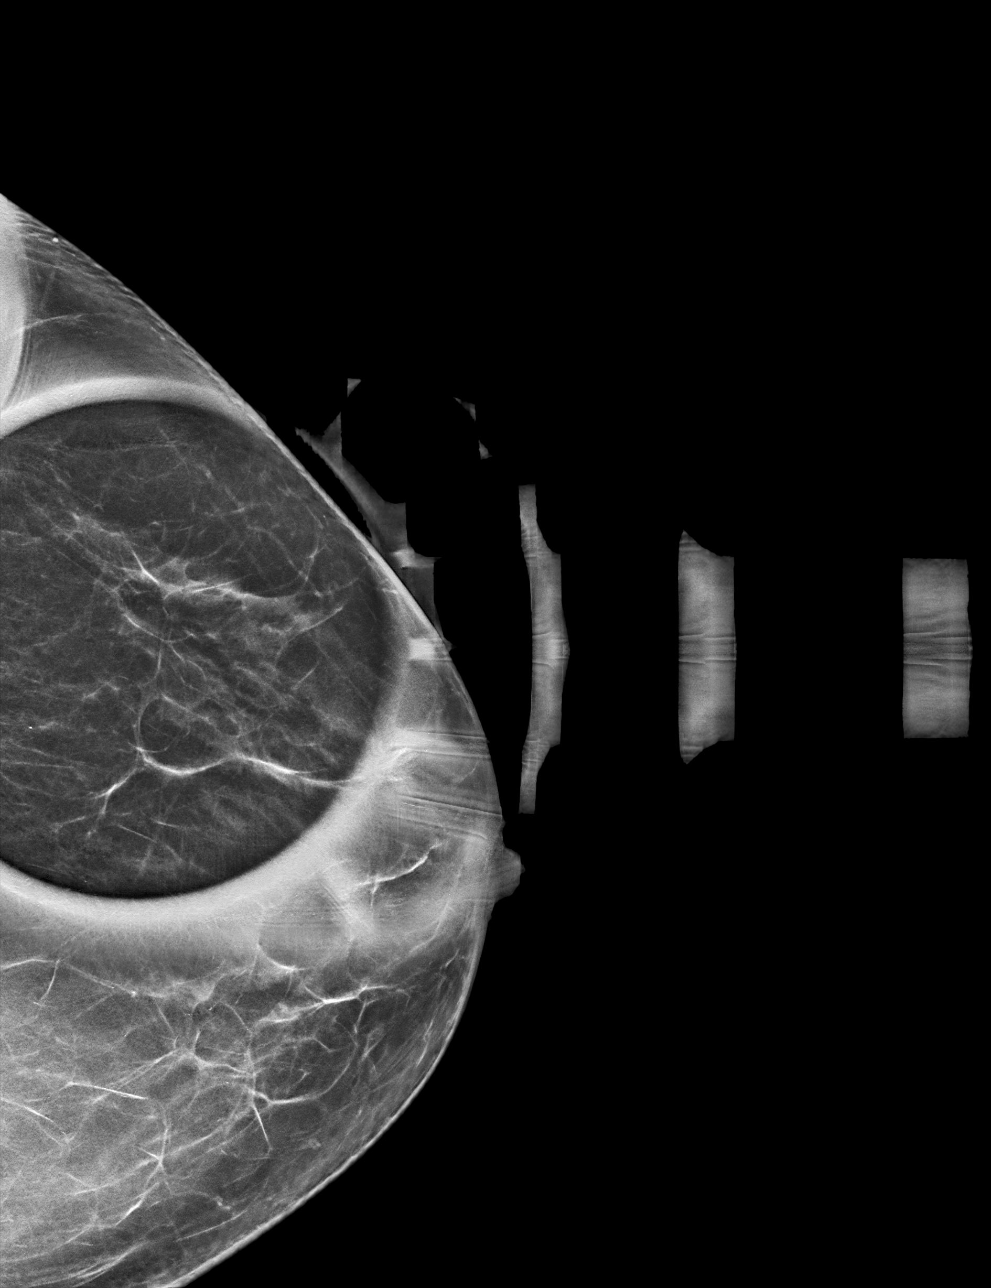

[L CC tomo · tomo slice 26/51.0]
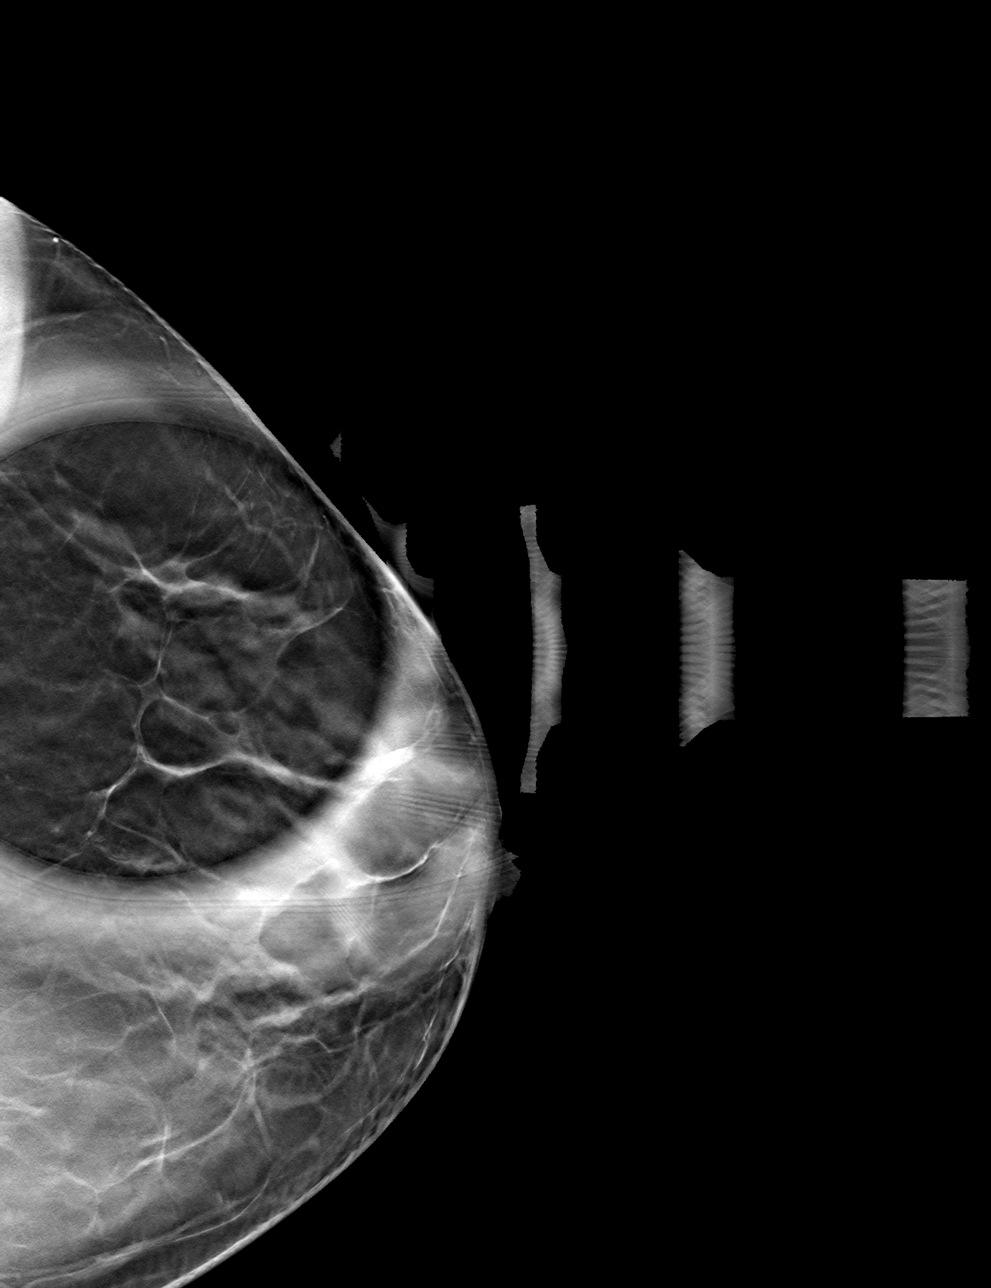

[L ML tomo · tomo slice 31/60.0]
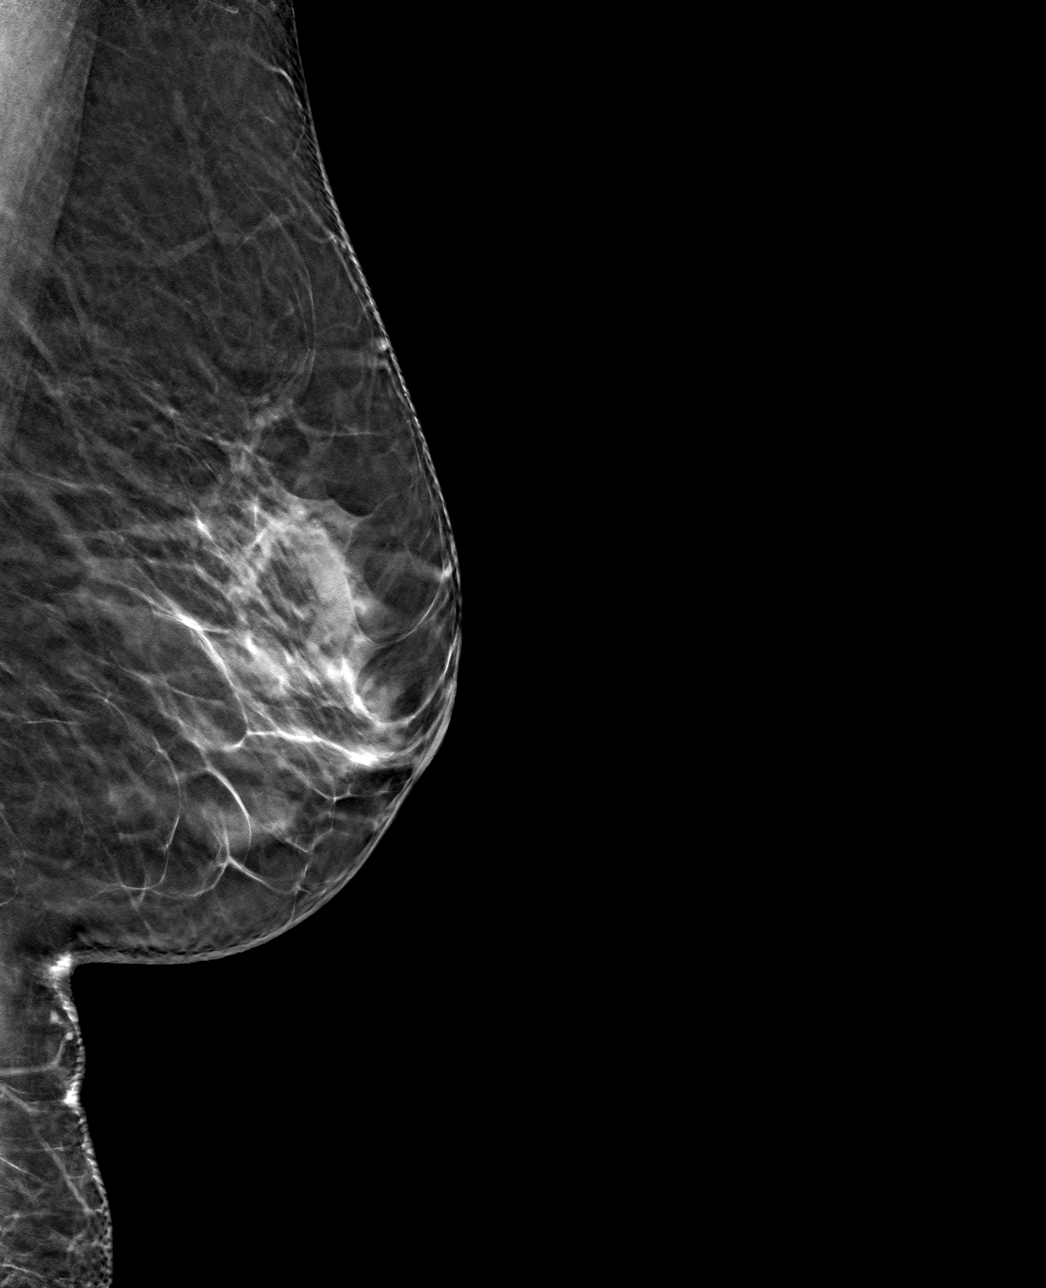

[4 of 12 positions shown; findings below may reference images not displayed]

ACR Breast Density Category b: There are scattered areas of
fibroglandular density.
FINDINGS: Spot compression tomosynthesis cc and full field mL tomosynthesis
views of the left breast performed for a questioned asymmetry seen
only on CC view in the outer left breast. On the spot imaging the
tissue in this area disperses without persistent asymmetry, mass, or
distortion. The finding on screening mammogram is most consistent
with normal fibroglandular tissue.
IMPRESSION: No mammographic evidence of malignancy in the left breast.

RECOMMENDATION:
Screening mammogram in one year.(Code:[id number]-[id number])

I have discussed the findings and recommendations with the patient.
If applicable, a reminder letter will be sent to the patient
regarding the next appointment.

BI-RADS CATEGORY  1: Negative.

## 2023-11-05 DIAGNOSIS — Z0100 Encounter for examination of eyes and vision without abnormal findings: Secondary | ICD-10-CM | POA: Diagnosis not present

## 2024-01-03 ENCOUNTER — Other Ambulatory Visit: Payer: Self-pay

## 2024-01-03 DIAGNOSIS — E782 Mixed hyperlipidemia: Secondary | ICD-10-CM

## 2024-01-03 MED ORDER — ROSUVASTATIN CALCIUM 20 MG PO TABS: 20.0000 mg | ORAL_TABLET | Freq: Every day | ORAL | 1 refills | Status: DC

## 2024-01-24 ENCOUNTER — Other Ambulatory Visit: Payer: Self-pay | Admitting: Nurse Practitioner

## 2024-01-24 DIAGNOSIS — Z1231 Encounter for screening mammogram for malignant neoplasm of breast: Secondary | ICD-10-CM

## 2024-02-02 NOTE — Patient Instructions (Signed)
 Please call to schedule your mammogram and/or bone density: [facility]  Address: [address], [place], [place] [address] Phone: [phone]  [facility] [address] [place],  [place]  [address] Phone: [phone]   Healthy Eating, Adult Healthy eating may help you get and keep a healthy body weight, reduce the risk of chronic disease, and live a long and productive life. It is important to follow a healthy eating pattern. Your nutritional and calorie needs should be met mainly by different nutrient-rich foods. What are tips for following this plan? Reading food labels Read labels and choose the following: Reduced or low sodium products. Juices with 100% fruit juice. Foods with low saturated fats (<3 g per serving) and high polyunsaturated and monounsaturated fats. Foods with whole grains, such as whole wheat, cracked wheat, brown rice, and wild rice. Whole grains that are fortified with folic acid. This is recommended for females who are pregnant or who want to become pregnant. Read labels and do not eat or drink the following: Foods or drinks with added sugars. These include foods that contain brown sugar, corn sweetener, corn syrup, dextrose, fructose, glucose, high-fructose corn syrup, honey, invert sugar, lactose, malt syrup, maltose, molasses, raw sugar, sucrose, trehalose, or turbinado sugar. Limit your intake of added sugars to less than 10% of your total daily calories. Do not eat more than the following amounts of added sugar per day: 6 teaspoons (25 g) for females. 9 teaspoons (38 g) for males. Foods that contain processed or refined starches and grains. Refined grain products, such as white flour, degermed cornmeal, white bread, and white rice. Shopping Choose nutrient-rich snacks, such as vegetables, whole fruits, and nuts. Avoid high-calorie and high-sugar snacks, such as potato chips, fruit  snacks, and candy. Use oil-based dressings and spreads on foods instead of solid fats such as butter, margarine, sour cream, or cream cheese. Limit pre-made sauces, mixes, and "instant" products such as flavored rice, instant noodles, and ready-made pasta. Try more plant-protein sources, such as tofu, tempeh, black beans, edamame, lentils, nuts, and seeds. Explore eating plans such as the Mediterranean diet or vegetarian diet. Try heart-healthy dips made with beans and healthy fats like hummus and guacamole. Vegetables go great with these. Cooking Use oil to saut or stir-fry foods instead of solid fats such as butter, margarine, or lard. Try baking, boiling, grilling, or broiling instead of frying. Remove the fatty part of meats before cooking. Steam vegetables in water or broth. Meal planning  At meals, imagine dividing your plate into fourths: One-half of your plate is fruits and vegetables. One-fourth of your plate is whole grains. One-fourth of your plate is protein, especially lean meats, poultry, eggs, tofu, beans, or nuts. Include low-fat dairy as part of your daily diet. Lifestyle Choose healthy options in all settings, including home, work, school, restaurants, or stores. Prepare your food safely: Wash your hands after handling raw meats. Where you prepare food, keep surfaces clean by regularly washing with hot, soapy water. Keep raw meats separate from ready-to-eat foods, such as fruits and vegetables. Cook seafood, meat, poultry, and eggs to the recommended temperature. Get a food thermometer. Store foods at safe temperatures. In general: Keep cold foods at 75F (4.4C) or below. Keep hot foods at 175F (60C) or above. Keep your freezer at [facility] (-17.8C) or below. Foods are not safe to eat if they have been between the temperatures of 40-175F (4.4-60C) for more than  2 hours. What foods should I eat? Fruits Aim to eat 1-2 cups of fresh, canned (in natural juice), or  frozen fruits each day. One cup of fruit equals 1 small apple, 1 large banana, 8 large strawberries, 1 cup (237 g) canned fruit,  cup (82 g) dried fruit, or 1 cup (240 mL) 100% juice. Vegetables Aim to eat 2-4 cups of fresh and frozen vegetables each day, including different varieties and colors. One cup of vegetables equals 1 cup (91 g) broccoli or cauliflower florets, 2 medium carrots, 2 cups (150 g) raw, leafy greens, 1 large tomato, 1 large bell pepper, 1 large sweet potato, or 1 medium white potato. Grains Aim to eat 5-10 ounce-equivalents of whole grains each day. Examples of 1 ounce-equivalent of grains include 1 slice of bread, 1 cup (40 g) ready-to-eat cereal, 3 cups (24 g) popcorn, or  cup (93 g) cooked rice. Meats and other proteins Try to eat 5-7 ounce-equivalents of protein each day. Examples of 1 ounce-equivalent of protein include 1 egg,  oz nuts (12 almonds, 24 pistachios, or 7 walnut halves), 1/4 cup (90 g) cooked beans, 6 tablespoons (90 g) hummus or 1 tablespoon (16 g) peanut butter. A cut of meat or fish that is the size of a deck of cards is about 3-4 ounce-equivalents (85 g). Of the protein you eat each week, try to have at least 8 sounce (227 g) of seafood. This is about 2 servings per week. This includes salmon, trout, herring, sardines, and anchovies. Dairy Aim to eat 3 cup-equivalents of fat-free or low-fat dairy each day. Examples of 1 cup-equivalent of dairy include 1 cup (240 mL) milk, 8 ounces (250 g) yogurt, 1 ounces (44 g) natural cheese, or 1 cup (240 mL) fortified soy milk. Fats and oils Aim for about 5 teaspoons (21 g) of fats and oils per day. Choose monounsaturated fats, such as canola and olive oils, mayonnaise made with olive oil or avocado oil, avocados, peanut butter, and most nuts, or polyunsaturated fats, such as sunflower, corn, and soybean oils, walnuts, pine nuts, sesame seeds, sunflower seeds, and flaxseed. Beverages Aim for 6 eight-ounce glasses of  water per day. Limit coffee to 3-5 eight-ounce cups per day. Limit caffeinated beverages that have added calories, such as soda and energy drinks. If you drink alcohol: Limit how much you have to: 0-1 drink a day if you are female. 0-2 drinks a day if you are female. Know how much alcohol is in your drink. In the [place] one drink is one 12 oz bottle of beer (355 mL), one 5 oz glass of wine (148 mL), or one 1 oz glass of hard liquor (44 mL). Seasoning and other foods Try not to add too much salt to your food. Try using herbs and spices instead of salt. Try not to add sugar to food. This information is based on [place] nutrition guidelines. To learn more, visit [employer]posableN[employer].be. Exact amounts may vary. You may need different amounts. This information is not intended to replace advice given to you by your health care provider. Make sure you discuss any questions you have with your health care provider. Document Revised: 08/13/2022 Document Reviewed: 08/13/2022 Elsevier Patient Education  2024 [employer].

## 2024-02-04 ENCOUNTER — Encounter: Payer: Self-pay | Admitting: Nurse Practitioner

## 2024-02-04 ENCOUNTER — Ambulatory Visit (INDEPENDENT_AMBULATORY_CARE_PROVIDER_SITE_OTHER): Payer: Self-pay | Admitting: Nurse Practitioner

## 2024-02-04 VITALS — BP 125/75 | HR 79 | Temp 98.1°F | Ht 58.3 in | Wt 122.8 lb

## 2024-02-04 DIAGNOSIS — D329 Benign neoplasm of meninges, unspecified: Secondary | ICD-10-CM

## 2024-02-04 DIAGNOSIS — E559 Vitamin D deficiency, unspecified: Secondary | ICD-10-CM | POA: Diagnosis not present

## 2024-02-04 DIAGNOSIS — Z780 Asymptomatic menopausal state: Secondary | ICD-10-CM | POA: Diagnosis not present

## 2024-02-04 DIAGNOSIS — E039 Hypothyroidism, unspecified: Secondary | ICD-10-CM

## 2024-02-04 DIAGNOSIS — E782 Mixed hyperlipidemia: Secondary | ICD-10-CM

## 2024-02-04 DIAGNOSIS — Z23 Encounter for immunization: Secondary | ICD-10-CM

## 2024-02-04 DIAGNOSIS — Z0000 Encounter for general adult medical examination without abnormal findings: Secondary | ICD-10-CM | POA: Diagnosis not present

## 2024-02-04 DIAGNOSIS — J301 Allergic rhinitis due to pollen: Secondary | ICD-10-CM | POA: Diagnosis not present

## 2024-02-04 DIAGNOSIS — J4520 Mild intermittent asthma, uncomplicated: Secondary | ICD-10-CM

## 2024-02-04 LAB — URINALYSIS, ROUTINE W REFLEX MICROSCOPIC
Bilirubin, UA: NEGATIVE
Glucose, UA: NEGATIVE
Ketones, UA: NEGATIVE
Leukocytes,UA: NEGATIVE
Nitrite, UA: NEGATIVE
Protein,UA: NEGATIVE
RBC, UA: NEGATIVE
Specific Gravity, UA: 1.01 (ref 1.005–1.030)
Urobilinogen, Ur: 0.2 mg/dL (ref 0.2–1.0)
pH, UA: 6 (ref 5.0–7.5)

## 2024-02-04 MED ORDER — GABAPENTIN 300 MG PO CAPS: 300.0000 mg | ORAL_CAPSULE | Freq: Every day | ORAL | 4 refills | Status: DC

## 2024-02-04 MED ORDER — AZITHROMYCIN 250 MG PO TABS
ORAL_TABLET | ORAL | 0 refills | Status: AC
Start: 2024-02-04 — End: 2024-02-09

## 2024-02-04 MED ORDER — LEVOTHYROXINE SODIUM 50 MCG PO TABS: 50.0000 ug | ORAL_TABLET | Freq: Every day | ORAL | 4 refills | Status: AC

## 2024-02-04 MED ORDER — CETIRIZINE HCL 10 MG PO TABS: 10.0000 mg | ORAL_TABLET | Freq: Every day | ORAL | 11 refills | Status: DC

## 2024-02-04 MED ORDER — HYDROCOD POLI-CHLORPHE POLI ER 10-8 MG/5ML PO SUER: 5.0000 mL | Freq: Every evening | ORAL | 0 refills | Status: DC | PRN

## 2024-02-04 MED ORDER — ALBUTEROL SULFATE HFA 108 (90 BASE) MCG/ACT IN AERS: 2.0000 | INHALATION_SPRAY | Freq: Four times a day (QID) | RESPIRATORY_TRACT | 5 refills | Status: AC | PRN

## 2024-02-04 MED ORDER — ROSUVASTATIN CALCIUM 20 MG PO TABS: 20.0000 mg | ORAL_TABLET | Freq: Every day | ORAL | 1 refills | Status: DC

## 2024-02-04 NOTE — Assessment & Plan Note (Signed)
 Ongoing with underlying asthma.  Continue Zyrtec which she reports works better.  Albuterol as needed for cough.  If worsening consider allergy testing or ENT + consider Singulair.

## 2024-02-04 NOTE — Assessment & Plan Note (Signed)
 Chronic, ongoing.  Continue current medication regimen and adjust as needed.  Labs today.

## 2024-02-04 NOTE — Progress Notes (Signed)
 BP 125/75 (BP Location: Left Arm, Cuff Size: Normal)   Pulse 79   Temp 98.1 F (36.7 C) (Oral)   Ht 4' 10.3" (1.481 m)   Wt 122 lb 12.8 oz (55.7 kg)   LMP  (LMP Unknown)   SpO2 98%   BMI 25.40 kg/m    Subjective:    Patient ID: Katherine [name], female    DOB: 04-Feb-1956, 68 y.o.   MRN: [id number]  HPI: Katherine [name] is a 68 y.o. female presenting on 02/04/2024 for comprehensive medical examination. Current medical complaints include:none  She currently lives with:spouse Menopausal Symptoms: no  Continues Gabapentin at night for back pain.  Saw neurology last 06/27/23 for meningioma, to return as needed.  HYPERLIPIDEMIA Taking Crestor daily. Hyperlipidemia status: good compliance Satisfied with current treatment?  yes Side effects:  no Medication compliance: good compliance Past cholesterol meds: Crestor Supplements: none Aspirin:  no The ASCVD Risk score (Arnett DK, et al., 2019) failed to calculate for the following reasons:   Risk score cannot be calculated because patient has a medical history suggesting prior/existing ASCVD Chest pain:  no Coronary artery disease:  no Family history CAD:  no Family history early CAD:  no   ASTHMA Taking Zyrtec.  Has had a cough for over one week, productive.  Out of Albuterol inhaler at home. Took OTC cough medicine and this is not working.  Has had some rhinorrhea with this and throat is scratchy.  No congestion or fever. Asthma status: stable Satisfied with current treatment?: yes Albuterol/rescue inhaler frequency:  Dyspnea frequency: occasional recently with cough Wheezing frequency: no Cough frequency: yes Nocturnal symptom frequency: no Limitation of activity: no Current upper respiratory symptoms: no Triggers: pollen Aerochamber/spacer use: no Visits to ER or Urgent Care in past year: no Pneumovax: Not up to Date - provide today Influenza: yes  HYPOTHYROIDISM  Current Levothyroxine dose is 50 MCG.   Thyroid  control status:stable  Satisfied with current treatment? yes  Medication side effects: no  Medication compliance: good compliance  Etiology of hypothyroidism:  Recent dose adjustment:no  Fatigue: none Cold intolerance: none Heat intolerance: none Weight gain: none Weight loss: none Constipation:none Diarrhea/loose stools: none Palpitations: none Lower extremity edema: none Anxiety/depressed mood: none      02/04/2024   10:28 AM 08/02/2023   10:56 AM 05/20/2023    3:36 PM 01/30/2023    1:55 PM 11/29/2021   11:52 AM  Depression screen PHQ 2/9  Decreased Interest 0 0 0 0 0  Down, Depressed, Hopeless 0 0 0 0 0  PHQ - 2 Score 0 0 0 0 0  Altered sleeping 0 0 1 0 1  Tired, decreased energy 0 0 2 0 3  Change in appetite 0 0 1 0 0  Feeling bad or failure about yourself  0 0 0 0 0  Trouble concentrating 0 0 0 0 0  Moving slowly or fidgety/restless 0 0 0 0 0  Suicidal thoughts 0 0 0 0 0  PHQ-9 Score 0 0 4 0 4  Difficult doing work/chores Not difficult at all Not difficult at all   Not difficult at all       02/04/2024   10:28 AM 08/02/2023   10:56 AM 05/20/2023    3:37 PM 01/30/2023    1:55 PM  GAD 7 : Generalized Anxiety Score  Nervous, Anxious, on Edge 0 0 0 0  Control/stop worrying 0 0 0 0  Worry too much - different things 0 0 0 0  Trouble relaxing 0 0 0 0  Restless 0 0 0 0  Easily annoyed or irritable 0 0 0 0  Afraid - awful might happen 0 0 0 0  Total GAD 7 Score 0 0 0 0  Anxiety Difficulty Not difficult at all Not difficult at all Not difficult at all        11/29/2021   11:52 AM 01/05/2022   10:39 AM 01/30/2023    1:55 PM 08/02/2023   10:56 AM 02/04/2024   10:28 AM  Fall Risk  Falls in the past year? 0 0 0 0 0  Was there an injury with Fall? 0 0 0 0 0  Fall Risk Category Calculator 0 0 0 0 0  Fall Risk Category (Retired) Low Low     (RETIRED) Patient Fall Risk Level  Low fall risk     Patient at Risk for Falls Due to No Fall Risks No Fall Risks No Fall Risks No Fall Risks  No Fall Risks  Fall risk Follow up Falls evaluation completed Falls prevention discussed Falls prevention discussed Falls evaluation completed Falls evaluation completed    Functional Status Survey: Is the patient deaf or have difficulty hearing?: No Does the patient have difficulty seeing, even when wearing glasses/contacts?: No Does the patient have difficulty concentrating, remembering, or making decisions?: No Does the patient have difficulty walking or climbing stairs?: No Does the patient have difficulty dressing or bathing?: No Does the patient have difficulty doing errands alone such as visiting a doctor's office or shopping?: No   Past Medical History:  Past Medical History:  Diagnosis Date   Anemia    Arthritis    joints   Carpal tunnel syndrome    right arm   Dyspnea    GERD (gastroesophageal reflux disease)    Hypothyroidism    IBS (irritable bowel syndrome)    Menopause    Motion sickness    Wears partial dentures    upper    Surgical History:  Past Surgical History:  Procedure Laterality Date   APPENDECTOMY     COLONOSCOPY WITH PROPOFOL N/A 01/01/2018   Procedure: COLONOSCOPY WITH PROPOFOL;  Surgeon: Toney Reil, MD;  [facility];  Service: Endoscopy;  Laterality: N/A;  speaks Laotian   ROTATOR CUFF REPAIR Right 11/18/2020   UPPER GI ENDOSCOPY      Medications:  Current Outpatient Medications on File Prior to Visit  Medication Sig   cyanocobalamin (VITAMIN B12) 1000 MCG tablet Take 1,000 mcg by mouth daily.   ferrous sulfate 325 (65 FE) MG EC tablet Take 325 mg by mouth daily with breakfast.   magnesium oxide (MAG-OX) 400 MG tablet Take 400 mg by mouth daily.   No current facility-administered medications on file prior to visit.    Allergies:  Allergies  Allergen Reactions   Asa [Aspirin] Other (See Comments)    Makes blood thin    Social History:  Social History   Socioeconomic History   Marital status: Married     Spouse name: Not on file   Number of children: Not on file   Years of education: Not on file   Highest education level: Not on file  Occupational History   Not on file  Tobacco Use   Smoking status: Never   Smokeless tobacco: Never  Vaping Use   Vaping status: Never Used  Substance and Sexual Activity   Alcohol use: No    Alcohol/week: 0.0 standard drinks of alcohol    Comment:  rarely   Drug use: No   Sexual activity: Never  Other Topics Concern   Not on file  Social History Narrative   Not on file   Social Drivers of Health   Financial Resource Strain: High Risk (07/16/2019)   Overall Financial Resource Strain (CARDIA)    Difficulty of Paying Living Expenses: Very hard  Food Insecurity: No Food Insecurity (07/28/2019)   Received from [facility], [facility]   Hunger Vital Sign    Worried About Running Out of Food in the Last Year: Never true    Ran Out of Food in the Last Year: Never true  Recent Concern: Food Insecurity - Food Insecurity Present (07/16/2019)   Hunger Vital Sign    Worried About Running Out of Food in the Last Year: Often true    Ran Out of Food in the Last Year: Often true  Transportation Needs: No Transportation Needs (07/28/2019)   Received from [facility], [facility]   [facility] - Transportation    Lack of Transportation (Medical): No    Lack of Transportation (Non-Medical): No  Physical Activity: Insufficiently Active (07/16/2019)   Exercise Vital Sign    Days of Exercise per Week: 4 days    Minutes of Exercise per Session: 20 min  Stress: Stress Concern Present (07/16/2019)   [employer] of Occupational Health - Occupational Stress Questionnaire    Feeling of Stress : To some extent  Social Connections: Moderately Isolated (07/16/2019)   Social Connection and Isolation Panel [NHANES]    Frequency of Communication with Friends and Family: More than three times a week    Frequency of Social Gatherings with Friends and Family: More  than three times a week    Attends Religious Services: Never    Database administrator or Organizations: No    Attends Banker Meetings: Never    Marital Status: Married  Catering manager Violence: Not At Risk (07/16/2019)   Humiliation, Afraid, Rape, and Kick questionnaire    Fear of Current or Ex-Partner: No    Emotionally Abused: No    Physically Abused: No    Sexually Abused: No   Social History   Tobacco Use  Smoking Status Never  Smokeless Tobacco Never   Social History   Substance and Sexual Activity  Alcohol Use No   Alcohol/week: 0.0 standard drinks of alcohol   Comment: rarely    Family History:  Family History  Problem Relation Age of Onset   Breast cancer Neg Hx     Past medical history, surgical history, medications, allergies, family history and social history reviewed with patient today and changes made to appropriate areas of the chart.   ROS All other ROS negative except what is listed above and in the HPI.      Objective:    BP 125/75 (BP Location: Left Arm, Cuff Size: Normal)   Pulse 79   Temp 98.1 F (36.7 C) (Oral)   Ht 4' 10.3" (1.481 m)   Wt 122 lb 12.8 oz (55.7 kg)   LMP  (LMP Unknown)   SpO2 98%   BMI 25.40 kg/m   Wt Readings from Last 3 Encounters:  02/04/24 122 lb 12.8 oz (55.7 kg)  08/02/23 117 lb 3.2 oz (53.2 kg)  05/20/23 118 lb 9.6 oz (53.8 kg)    Physical Exam Vitals and nursing note reviewed. Exam conducted with a chaperone present.  Constitutional:      General: She is awake. She  is not in acute distress.    Appearance: She is well-developed. She is not ill-appearing.  HENT:     Head: Normocephalic and atraumatic.     Right Ear: Hearing, tympanic membrane, ear canal and external ear normal. No drainage.     Left Ear: Hearing, tympanic membrane, ear canal and external ear normal. No drainage.     Nose: Nose normal.     Right Sinus: No maxillary sinus tenderness or frontal sinus tenderness.     Left Sinus:  No maxillary sinus tenderness or frontal sinus tenderness.     Mouth/Throat:     Mouth: Mucous membranes are moist.     Pharynx: Oropharynx is clear. Uvula midline. No pharyngeal swelling, oropharyngeal exudate or posterior oropharyngeal erythema.  Eyes:     General: Lids are normal.        Right eye: No discharge.        Left eye: No discharge.     Extraocular Movements: Extraocular movements intact.     Conjunctiva/sclera: Conjunctivae normal.     Pupils: Pupils are equal, round, and reactive to light.     Visual Fields: Right eye visual fields normal and left eye visual fields normal.  Neck:     Thyroid: No thyromegaly.     Vascular: No carotid bruit.     Trachea: Trachea normal.  Cardiovascular:     Rate and Rhythm: Normal rate and regular rhythm.     Heart sounds: Normal heart sounds. No murmur heard.    No gallop.  Pulmonary:     Effort: Pulmonary effort is normal. No accessory muscle usage or respiratory distress.     Breath sounds: Normal breath sounds.  Chest:  Breasts:    Right: Normal.     Left: Normal.  Abdominal:     General: Bowel sounds are normal.     Palpations: Abdomen is soft. There is no hepatomegaly or splenomegaly.     Tenderness: There is no abdominal tenderness.  Musculoskeletal:     Cervical back: Normal range of motion and neck supple.     Right lower leg: No edema.     Left lower leg: No edema.  Lymphadenopathy:     Head:     Right side of head: No submental, submandibular, tonsillar, preauricular or posterior auricular adenopathy.     Left side of head: No submental, submandibular, tonsillar, preauricular or posterior auricular adenopathy.     Cervical: No cervical adenopathy.     Upper Body:     Right upper body: No supraclavicular, axillary or pectoral adenopathy.     Left upper body: No supraclavicular, axillary or pectoral adenopathy.  Skin:    General: Skin is warm and dry.     Capillary Refill: Capillary refill takes less than 2 seconds.      Findings: No rash.  Neurological:     Mental Status: She is alert and oriented to person, place, and time.     Cranial Nerves: Cranial nerves 2-12 are intact.     Sensory: Sensation is intact.     Motor: Motor function is intact.     Coordination: Coordination is intact.     Gait: Gait is intact.     Deep Tendon Reflexes: Reflexes are normal and symmetric.     Reflex Scores:      Brachioradialis reflexes are 2+ on the right side and 2+ on the left side.      Patellar reflexes are 2+ on the right side and 2+ on  the left side. Psychiatric:        Attention and Perception: Attention normal.        Mood and Affect: Mood normal.        Speech: Speech normal.        Behavior: Behavior normal. Behavior is cooperative.        Thought Content: Thought content normal.        Judgment: Judgment normal.    Results for orders placed or performed in visit on 08/02/23  Comprehensive metabolic panel   Collection Time: 08/02/23 11:31 AM  Result Value Ref Range   Glucose 105 (H) 70 - 99 mg/dL   BUN 12 8 - 27 mg/dL   Creatinine, Ser 1.61 0.57 - 1.00 mg/dL   eGFR 99 >09 UE/AVW/0.98   BUN/Creatinine Ratio 19 12 - 28   Sodium 140 134 - 144 mmol/L   Potassium 4.2 3.5 - 5.2 mmol/L   Chloride 105 96 - 106 mmol/L   CO2 20 20 - 29 mmol/L   Calcium 9.4 8.7 - 10.3 mg/dL   Total Protein 7.3 6.0 - 8.5 g/dL   Albumin 4.2 3.9 - 4.9 g/dL   Globulin, Total 3.1 1.5 - 4.5 g/dL   Bilirubin Total 0.3 0.0 - 1.2 mg/dL   Alkaline Phosphatase 100 44 - 121 IU/L   AST 25 0 - 40 IU/L   ALT 19 0 - 32 IU/L  Lipid Panel w/o Chol/HDL Ratio   Collection Time: 08/02/23 11:31 AM  Result Value Ref Range   Cholesterol, Total 153 100 - 199 mg/dL   Triglycerides 119 (H) 0 - 149 mg/dL   HDL 51 >14 mg/dL   VLDL Cholesterol Cal 51 (H) 5 - 40 mg/dL   LDL Chol Calc (NIH) 51 0 - 99 mg/dL      Assessment & Plan:   Problem List Items Addressed This Visit       Respiratory   Allergic rhinitis   Ongoing with underlying  asthma.  Continue Zyrtec which she reports works better.  Albuterol as needed for cough.  If worsening consider allergy testing or ENT + consider Singulair.      Asthma   Chronic, ongoing with exacerbation at present.  Will send in Zpack and Tussionex which has offered her benefit in past.  Continue Albuterol and adjust as needed.  Will continue Zyrtec, which she reports works best. Asthma triggered by environmental allergies.      Relevant Medications   albuterol (VENTOLIN HFA) 108 (90 Base) MCG/ACT inhaler   Other Relevant Orders   CBC with Differential/Platelet     Endocrine   Hypothyroid   Chronic, ongoing.  Continue current medication regimen and adjust as needed.  Assess thyroid labs today.      Relevant Medications   levothyroxine (SYNTHROID) 50 MCG tablet   Other Relevant Orders   TSH   T4, free   Urinalysis, Routine w reflex microscopic     Nervous and Auditory   Meningioma (HCC) - Primary   Stable.  Noted on imaging at [facility] 04/16/23.  She reports this has been present since she lived in [place] many years ago, no records for this to review. Currently no neuro symptoms.  She saw neurology, recent note reviewed.  Reports she will only return there as needed.      Relevant Orders   Urinalysis, Routine w reflex microscopic     Other   Hyperlipidemia   Chronic, ongoing.  Continue current medication regimen and adjust as needed.  Labs today.      Relevant Medications   rosuvastatin (CRESTOR) 20 MG tablet   Other Relevant Orders   Comprehensive metabolic panel   Lipid Panel w/o Chol/HDL Ratio   Vitamin D deficiency   History of low levels reported, check today and start supplement as needed which would benefit bone health.      Relevant Orders   VITAMIN D 25 Hydroxy (Vit-D Deficiency, Fractures)   Other Visit Diagnoses       Postmenopausal estrogen deficiency       DEXA ordered and instructed how to scheduled with mammo.   Relevant Orders   DG Bone Density      Pneumococcal vaccination given       PCV20 in office today, educated on this.   Relevant Orders   Pneumococcal conjugate vaccine 20-valent     Encounter for annual physical exam       Annual physical today with labs and health maintenance reviewed, discussed with patient.        Follow up plan: Return in about 6 months (around 08/06/2024) for Welcome to Medicare .   LABORATORY TESTING:  - Pap smear: up to date  IMMUNIZATIONS:   - Tdap: Tetanus vaccination status reviewed: last tetanus booster within 10 years. - Influenza: Up to date - Pneumovax: Not applicable - Prevnar: PCV20 today - COVID: Up to date - HPV: Not applicable - Shingrix vaccine: reports having 2 of these -- one in chart documented  SCREENING: -Mammogram: Up To Date - Colonoscopy: Up to date  - Bone Density: Ordered today and discussed with patient to schedule with her mammogram in April -Hearing Test: Not applicable  -Spirometry: Not applicable   PATIENT COUNSELING:   Advised to take 1 mg of folate supplement per day if capable of pregnancy.   Sexuality: Discussed sexually transmitted diseases, partner selection, use of condoms, avoidance of unintended pregnancy  and contraceptive alternatives.   Advised to avoid cigarette smoking.  I discussed with the patient that most people either abstain from alcohol or drink within safe limits (<=14/week and <=4 drinks/occasion for males, <=7/weeks and <= 3 drinks/occasion for females) and that the risk for alcohol disorders and other health effects rises proportionally with the number of drinks per week and how often a drinker exceeds daily limits.  Discussed cessation/primary prevention of drug use and availability of treatment for abuse.   Diet: Encouraged to adjust caloric intake to maintain  or achieve ideal body weight, to reduce intake of dietary saturated fat and total fat, to limit sodium intake by avoiding high sodium foods and not adding table salt, and to  maintain adequate dietary potassium and calcium preferably from fresh fruits, vegetables, and low-fat dairy products.    Stressed the importance of regular exercise  Injury prevention: Discussed safety belts, safety helmets, smoke detector, smoking near bedding or upholstery.   Dental health: Discussed importance of regular tooth brushing, flossing, and dental visits.    NEXT PREVENTATIVE PHYSICAL DUE IN 1 YEAR. Return in about 6 months (around 08/06/2024) for Welcome to Medicare .

## 2024-02-04 NOTE — Assessment & Plan Note (Signed)
History of low levels reported, check today and start supplement as needed which would benefit bone health.

## 2024-02-04 NOTE — Assessment & Plan Note (Signed)
 Chronic, ongoing with exacerbation at present.  Will send in Zpack and Tussionex which has offered her benefit in past.  Continue Albuterol and adjust as needed.  Will continue Zyrtec, which she reports works best. Asthma triggered by environmental allergies.

## 2024-02-04 NOTE — Assessment & Plan Note (Signed)
 Stable.  Noted on imaging at [facility] 04/16/23.  She reports this has been present since she lived in [place] many years ago, no records for this to review. Currently no neuro symptoms.  She saw neurology, recent note reviewed.  Reports she will only return there as needed.

## 2024-02-04 NOTE — Assessment & Plan Note (Signed)
Chronic, ongoing.  Continue current medication regimen and adjust as needed.  Assess thyroid labs today. 

## 2024-02-05 LAB — CBC WITH DIFFERENTIAL/PLATELET
Basophils Absolute: 0.1 10*3/uL (ref 0.0–0.2)
Basos: 1 %
EOS (ABSOLUTE): 0.2 10*3/uL (ref 0.0–0.4)
Eos: 2 %
Hematocrit: 41.3 % (ref 34.0–46.6)
Hemoglobin: 13.1 g/dL (ref 11.1–15.9)
Immature Grans (Abs): 0 10*3/uL (ref 0.0–0.1)
Immature Granulocytes: 0 %
Lymphocytes Absolute: 2.4 10*3/uL (ref 0.7–3.1)
Lymphs: 34 %
MCH: 25.6 pg — ABNORMAL LOW (ref 26.6–33.0)
MCHC: 31.7 g/dL (ref 31.5–35.7)
MCV: 81 fL (ref 79–97)
Monocytes Absolute: 0.6 10*3/uL (ref 0.1–0.9)
Monocytes: 8 %
Neutrophils Absolute: 3.9 10*3/uL (ref 1.4–7.0)
Neutrophils: 55 %
Platelets: 241 10*3/uL (ref 150–450)
RBC: 5.11 x10E6/uL (ref 3.77–5.28)
RDW: 13.7 % (ref 11.7–15.4)
WBC: 7 10*3/uL (ref 3.4–10.8)

## 2024-02-05 LAB — COMPREHENSIVE METABOLIC PANEL
ALT: 20 IU/L (ref 0–32)
AST: 24 IU/L (ref 0–40)
Albumin: 4.2 g/dL (ref 3.9–4.9)
Alkaline Phosphatase: 116 IU/L (ref 44–121)
BUN/Creatinine Ratio: 21 (ref 12–28)
BUN: 14 mg/dL (ref 8–27)
Bilirubin Total: 0.3 mg/dL (ref 0.0–1.2)
CO2: 20 mmol/L (ref 20–29)
Calcium: 9 mg/dL (ref 8.7–10.3)
Chloride: 106 mmol/L (ref 96–106)
Creatinine, Ser: 0.66 mg/dL (ref 0.57–1.00)
Globulin, Total: 2.8 g/dL (ref 1.5–4.5)
Glucose: 108 mg/dL — ABNORMAL HIGH (ref 70–99)
Potassium: 3.9 mmol/L (ref 3.5–5.2)
Sodium: 141 mmol/L (ref 134–144)
Total Protein: 7 g/dL (ref 6.0–8.5)
eGFR: 97 mL/min/{1.73_m2} (ref >59)

## 2024-02-05 LAB — VITAMIN D 25 HYDROXY (VIT D DEFICIENCY, FRACTURES): Vit D, 25-Hydroxy: 47.3 ng/mL (ref 30.0–100.0)

## 2024-02-05 LAB — LIPID PANEL W/O CHOL/HDL RATIO
Cholesterol, Total: 117 mg/dL (ref 100–199)
HDL: 57 mg/dL (ref >39)
LDL Chol Calc (NIH): 45 mg/dL (ref 0–99)
Triglycerides: 71 mg/dL (ref 0–149)
VLDL Cholesterol Cal: 15 mg/dL (ref 5–40)

## 2024-02-05 LAB — TSH: TSH: 1.47 u[IU]/mL (ref 0.450–4.500)

## 2024-02-05 LAB — T4, FREE: Free T4: 1.27 ng/dL (ref 0.82–1.77)

## 2024-02-05 NOTE — Progress Notes (Signed)
 Good morning crew, please let Katherine [name] know her labs have returned and overall remain stable.  No changes needed.  Overall very healthy labs.  Great job!! Keep being amazing!!  Thank you for allowing me to participate in your care.  I appreciate you. Kindest regards, Iyanla Eilers

## 2024-02-27 ENCOUNTER — Ambulatory Visit
Admission: RE | Admit: 2024-02-27 | Discharge: 2024-02-27 | Disposition: A | Discharge status: Home / Self Care | Payer: 59 | Source: Ambulatory Visit | Attending: Nurse Practitioner | Admitting: Nurse Practitioner

## 2024-02-27 DIAGNOSIS — Z1231 Encounter for screening mammogram for malignant neoplasm of breast: Secondary | ICD-10-CM | POA: Diagnosis not present

## 2024-05-01 ENCOUNTER — Other Ambulatory Visit: Payer: Self-pay | Admitting: Nurse Practitioner

## 2024-05-01 DIAGNOSIS — E039 Hypothyroidism, unspecified: Secondary | ICD-10-CM

## 2024-05-02 ENCOUNTER — Encounter: Payer: Self-pay | Admitting: Emergency Medicine

## 2024-05-02 ENCOUNTER — Ambulatory Visit
Admission: EM | Admit: 2024-05-02 | Discharge: 2024-05-02 | Disposition: A | Discharge status: Home / Self Care | Attending: Physician Assistant | Admitting: Physician Assistant

## 2024-05-02 DIAGNOSIS — H9202 Otalgia, left ear: Secondary | ICD-10-CM | POA: Diagnosis not present

## 2024-05-02 DIAGNOSIS — H7292 Unspecified perforation of tympanic membrane, left ear: Secondary | ICD-10-CM | POA: Diagnosis not present

## 2024-05-02 DIAGNOSIS — J029 Acute pharyngitis, unspecified: Secondary | ICD-10-CM | POA: Insufficient documentation

## 2024-05-02 LAB — GROUP A STREP BY PCR: Group A Strep by PCR: NOT DETECTED

## 2024-05-02 MED ORDER — PSEUDOEPH-BROMPHEN-DM 30-2-10 MG/5ML PO SYRP: 10.0000 mL | ORAL_SOLUTION | Freq: Four times a day (QID) | ORAL | 0 refills | Status: AC | PRN

## 2024-05-02 MED ORDER — IPRATROPIUM BROMIDE 0.06 % NA SOLN: 2.0000 | Freq: Four times a day (QID) | NASAL | 0 refills | Status: DC

## 2024-05-02 MED ORDER — OFLOXACIN 0.3 % OT SOLN: 10.0000 [drp] | Freq: Every day | OTIC | 0 refills | Status: AC

## 2024-05-02 NOTE — ED Triage Notes (Signed)
 Patient c/o left ear pain for a week.  Patient denies any drainage from the ear.  Patient denies fevers.

## 2024-05-02 NOTE — ED Provider Notes (Signed)
 [facility]    CSN: [id number] Arrival date & time: 05/02/24  1448      History   Chief Complaint Chief Complaint  Patient presents with   Otalgia    left    HPI Katherine [name] is a 68 y.o. female presenting with a family member who helps to interpret.  She reports approximately 1 week history of left-sided ear pain, congestion and sore throat.  Patient says her sore throat has gotten worse today.  Denies fever, difficulty swallowing, chest pain, wheezing or shortness of breath.  Has not had any drainage from her ear or hearing changes.  Has been taking Tylenol  and Motrin over-the-counter and it has helped.  HPI  Past Medical History:  Diagnosis Date   Anemia    Arthritis    joints   Carpal tunnel syndrome    right arm   Dyspnea    GERD (gastroesophageal reflux disease)    Hypothyroidism    IBS (irritable bowel syndrome)    Menopause    Motion sickness    Wears partial dentures    upper    Patient Active Problem List   Diagnosis Date Noted   Meningioma (HCC) 04/18/2023   Neck pain 04/18/2023   Vitamin D  deficiency 01/06/2023   Chronic left hip pain 06/18/2022   Allergic rhinitis 02/27/2022   Chronic pain of both shoulders 08/23/2020   Asthma 07/09/2017   Low back pain 05/23/2017   Neuropathy 04/16/2017   IBS (irritable bowel syndrome) 07/21/2015   Hypothyroid 07/21/2015   Hyperlipidemia 05/17/2014    Past Surgical History:  Procedure Laterality Date   APPENDECTOMY     COLONOSCOPY WITH PROPOFOL  N/A 01/01/2018   Procedure: COLONOSCOPY WITH PROPOFOL ;  Surgeon: Selena Daily, MD;  Location: [facility];  Service: Endoscopy;  Laterality: N/A;  speaks Laotian   ROTATOR CUFF REPAIR Right 11/18/2020   UPPER GI ENDOSCOPY      OB History   No obstetric history on file.      Home Medications    Prior to Admission medications   Medication Sig Start Date End Date Taking? Authorizing Provider  brompheniramine-pseudoephedrine-DM  30-2-10 MG/5ML syrup Take 10 mLs by mouth 4 (four) times daily as needed for up to 7 days. 05/02/24 05/09/24 Yes Nancy Axon B, PA-C  ipratropium (ATROVENT) 0.06 % nasal spray Place 2 sprays into both nostrils 4 (four) times daily. 05/02/24  Yes Nancy Axon B, PA-C  ofloxacin (FLOXIN) 0.3 % OTIC solution Place 10 drops into the left ear daily for 7 days. 05/02/24 05/09/24 Yes Floydene Hy, PA-C  albuterol  (VENTOLIN  HFA) 108 (90 Base) MCG/ACT inhaler Inhale 2 puffs into the lungs every 6 (six) hours as needed for wheezing or shortness of breath. 02/04/24   Cannady, [name] T, NP  cetirizine  (ZYRTEC ) 10 MG tablet Take 1 tablet (10 mg total) by mouth daily. 02/04/24   Cannady, [name] T, NP  cyanocobalamin (VITAMIN B12) 1000 MCG tablet Take 1,000 mcg by mouth daily.    [provider]  ferrous sulfate 325 (65 FE) MG EC tablet Take 325 mg by mouth daily with breakfast.    [provider]  gabapentin  (NEURONTIN ) 300 MG capsule Take 1 capsule (300 mg total) by mouth at bedtime. 02/04/24   Cannady, [name] T, NP  levothyroxine  (SYNTHROID ) 50 MCG tablet Take 1 tablet (50 mcg total) by mouth daily. 02/04/24   Cannady, [name] T, NP  magnesium  oxide (MAG-OX) 400 MG tablet Take 400 mg by mouth daily.  [provider]  rosuvastatin  (CRESTOR ) 20 MG tablet Take 1 tablet (20 mg total) by mouth daily. 02/04/24   Cannady, [name] T, NP    Family History Family History  Problem Relation Age of Onset   Breast cancer Neg Hx     Social History Social History   Tobacco Use   Smoking status: Never   Smokeless tobacco: Never  Vaping Use   Vaping status: Never Used  Substance Use Topics   Alcohol use: No    Alcohol/week: 0.0 standard drinks of alcohol    Comment: rarely   Drug use: No     Allergies   Asa [aspirin ]   Review of Systems Review of Systems  Constitutional:  Negative for chills, diaphoresis, fatigue and fever.  HENT:  Positive for congestion, ear pain, rhinorrhea and  sore throat. Negative for sinus pressure and sinus pain.   Respiratory:  Negative for cough and shortness of breath.   Cardiovascular:  Negative for chest pain.  Gastrointestinal:  Negative for abdominal pain, nausea and vomiting.  Musculoskeletal:  Negative for arthralgias and myalgias.  Skin:  Negative for rash.  Neurological:  Negative for weakness and headaches.  Hematological:  Negative for adenopathy.     Physical Exam Triage Vital Signs ED Triage Vitals  Encounter Vitals Group     BP 05/02/24 1506 (!) 161/92     Systolic BP Percentile --      Diastolic BP Percentile --      Pulse Rate 05/02/24 1506 79     Resp 05/02/24 1506 14     Temp 05/02/24 1506 98.3 F (36.8 C)     Temp Source 05/02/24 1506 Oral     SpO2 05/02/24 1506 97 %     Weight 05/02/24 1504 122 lb 12.7 oz (55.7 kg)     Height 05/02/24 1504 4\' 10"  (1.473 m)     Head Circumference --      Peak Flow --      Pain Score 05/02/24 1504 5     Pain Loc --      Pain Education --      Exclude from Growth Chart --    No data found.  Updated Vital Signs BP (!) 161/92 (BP Location: Right Arm)   Pulse 79   Temp 98.3 F (36.8 C) (Oral)   Resp 14   Ht 4\' 10"  (1.473 m)   Wt 122 lb 12.7 oz (55.7 kg)   LMP  (LMP Unknown)   SpO2 97%   BMI 25.66 kg/m      Physical Exam Vitals and nursing note reviewed.  Constitutional:      General: She is not in acute distress.    Appearance: Normal appearance. She is not ill-appearing or toxic-appearing.  HENT:     Head: Normocephalic and atraumatic.     Right Ear: Tympanic membrane, ear canal and external ear normal.     Left Ear: Ear canal and external ear normal. A middle ear effusion is present.     Ears:     Comments: Tiny TM perforation left TM    Nose: Congestion present.     Mouth/Throat:     Mouth: Mucous membranes are moist.     Pharynx: Oropharynx is clear. Posterior oropharyngeal erythema present.  Eyes:     General: No scleral icterus.       Right eye: No  discharge.        Left eye: No discharge.     Conjunctiva/sclera: Conjunctivae normal.  Cardiovascular:     Rate and Rhythm: Normal rate and regular rhythm.     Heart sounds: Normal heart sounds.  Pulmonary:     Effort: Pulmonary effort is normal. No respiratory distress.     Breath sounds: Normal breath sounds.  Musculoskeletal:     Cervical back: Neck supple.  Skin:    General: Skin is dry.  Neurological:     General: No focal deficit present.     Mental Status: She is alert. Mental status is at baseline.     Motor: No weakness.     Gait: Gait normal.  Psychiatric:        Mood and Affect: Mood normal.        Behavior: Behavior normal.      UC Treatments / Results  Labs (all labs ordered are listed, but only abnormal results are displayed) Labs Reviewed  GROUP A STREP BY PCR    EKG   Radiology No results found.  Procedures Procedures (including critical care time)  Medications Ordered in UC Medications - No data to display  Initial Impression / Assessment and Plan / UC Course  I have reviewed the triage vital signs and the nursing notes.  Pertinent labs & imaging results that were available during my care of the patient were reviewed by me and considered in my medical decision making (see chart for details).   68 year old female presenting with family member for left-sided ear pain, congestion sore throat as well as mild cough for the past week.  Taking Tylenol  and Motrin and that has helped the ear discomfort.  She is afebrile.  Overall well-appearing.  On exam she has effusion of the left TM with mild bulging of TM and tiny perforation without drainage or bleeding.  Hearing intact.  Mild nasal congestion and mild posterior pharyngeal erythema.  Chest clear.  PCR strep test performed.  Advised we will contact her and start her on antibiotics if the strep is positive.  Likely viral URI.  Treating at this time with Bromfed-DM and Atrovent nasal spray.  I did  send ofloxacin eardrops given the small perforation to her eardrum for prophylaxis.  Reviewed continuing Tylenol , Motrin, warm compresses.  Reviewed return precautions.  Negative strep.    Final Clinical Impressions(s) / UC Diagnoses   Final diagnoses:  Otalgia of left ear  Perforation of left tympanic membrane  Sore throat     Discharge Instructions      - Testing for strep.  Will call if it is positive and then antibiotics. - Tiny perforation/hole in the eardrum.  This is not a major concern as it will heal on its own.  I did send an antibiotic drop to cover any potential developing infection.  I also sent a nasal spray and a cough medicine/decongestant.  Increase rest and fluids. - May continue Tylenol  and/or ibuprofen and warm compresses for ear pain.  ED Prescriptions     Medication Sig Dispense Auth. Provider   brompheniramine-pseudoephedrine-DM 30-2-10 MG/5ML syrup Take 10 mLs by mouth 4 (four) times daily as needed for up to 7 days. 150 mL Nancy Axon B, PA-C   ipratropium (ATROVENT) 0.06 % nasal spray Place 2 sprays into both nostrils 4 (four) times daily. 15 mL Nancy Axon B, PA-C   ofloxacin (FLOXIN) 0.3 % OTIC solution Place 10 drops into the left ear daily for 7 days. 5 mL Floydene Hy, PA-C      PDMP not reviewed this encounter.   Floydene Hy,  PA-C 05/02/24 1553

## 2024-05-02 NOTE — Discharge Instructions (Signed)
-   Testing for strep.  Will call if it is positive and then antibiotics. - Tiny perforation/hole in the eardrum.  This is not a major concern as it will heal on its own.  I did send an antibiotic drop to cover any potential developing infection.  I also sent a nasal spray and a cough medicine/decongestant.  Increase rest and fluids. - May continue Tylenol  and/or ibuprofen and warm compresses for ear pain.

## 2024-05-04 NOTE — Telephone Encounter (Signed)
 Too soon for refill, refilled 03/06/24 for 90 and 4 RF.  Requested Prescriptions  Pending Prescriptions Disp Refills   levothyroxine  (SYNTHROID ) 50 MCG tablet [Pharmacy Med Name: LEVOTHYROXINE  50 MCG TABLET]  0    Sig: Please specify directions, refills and quantity     Endocrinology:  Hypothyroid Agents Passed - 05/04/2024  9:03 AM      Passed - TSH in normal range and within 360 days    TSH  Date Value Ref Range Status  02/04/2024 1.470 0.450 - 4.500 uIU/mL Final         Passed - Valid encounter within last 12 months    Recent Outpatient Visits           3 months ago Meningioma [facility])   [facility] [place], Lavelle Posey, NP

## 2024-08-08 NOTE — Patient Instructions (Incomplete)
 Be Involved in Caring For Your Health:  Taking Medications When medications are taken as directed, they can greatly improve your health. But if they are not taken as prescribed, they may not work. In some cases, not taking them correctly can be harmful. To help ensure your treatment remains effective and safe, understand your medications and how to take them. Bring your medications to each visit for review by your provider.  Your lab results, notes, and after visit summary will be available on My Chart. We strongly encourage you to use this feature. If lab results are abnormal the clinic will contact you with the appropriate steps. If the clinic does not contact you assume the results are satisfactory. You can always view your results on My Chart. If you have questions regarding your health or results, please contact the clinic during office hours. You can also ask questions on My Chart.  We at [facility] are grateful that you chose us  to provide your care. We strive to provide evidence-based and compassionate care and are always looking for feedback. If you get a survey from the clinic please complete this so we can hear your opinions.  Healthy Eating, Adult Healthy eating may help you get and keep a healthy body weight, reduce the risk of chronic disease, and live a long and productive life. It is important to follow a healthy eating pattern. Your nutritional and calorie needs should be met mainly by different nutrient-rich foods. What are tips for following this plan? Reading food labels Read labels and choose the following: Reduced or low sodium products. Juices with 100% fruit juice. Foods with low saturated fats (<3 g per serving) and high polyunsaturated and monounsaturated fats. Foods with whole grains, such as whole wheat, cracked wheat, brown rice, and wild rice. Whole grains that are fortified with folic acid. This is recommended for females who are pregnant or who want to  become pregnant. Read labels and do not eat or drink the following: Foods or drinks with added sugars. These include foods that contain brown sugar, corn sweetener, corn syrup, dextrose , fructose, glucose, high-fructose corn syrup, honey, invert sugar, lactose, malt syrup, maltose, molasses, raw sugar, sucrose, trehalose, or turbinado sugar. Limit your intake of added sugars to less than 10% of your total daily calories. Do not eat more than the following amounts of added sugar per day: 6 teaspoons (25 g) for females. 9 teaspoons (38 g) for males. Foods that contain processed or refined starches and grains. Refined grain products, such as white flour, degermed cornmeal, white bread, and white rice. Shopping Choose nutrient-rich snacks, such as vegetables, whole fruits, and nuts. Avoid high-calorie and high-sugar snacks, such as potato chips, fruit snacks, and candy. Use oil-based dressings and spreads on foods instead of solid fats such as butter, margarine, sour cream, or cream cheese. Limit pre-made sauces, mixes, and instant products such as flavored rice, instant noodles, and ready-made pasta. Try more plant-protein sources, such as tofu, tempeh, black beans, edamame, lentils, nuts, and seeds. Explore eating plans such as the Mediterranean diet or vegetarian diet. Try heart-healthy dips made with beans and healthy fats like hummus and guacamole. Vegetables go great with these. Cooking Use oil to saut or stir-fry foods instead of solid fats such as butter, margarine, or lard. Try baking, boiling, grilling, or broiling instead of frying. Remove the fatty part of meats before cooking. Steam vegetables in water  or broth. Meal planning  At meals, imagine dividing your plate into fourths: One-half of  your plate is fruits and vegetables. One-fourth of your plate is whole grains. One-fourth of your plate is protein, especially lean meats, poultry, eggs, tofu, beans, or nuts. Include low-fat  dairy as part of your daily diet. Lifestyle Choose healthy options in all settings, including home, work, school, restaurants, or stores. Prepare your food safely: Wash your hands after handling raw meats. Where you prepare food, keep surfaces clean by regularly washing with hot, soapy water . Keep raw meats separate from ready-to-eat foods, such as fruits and vegetables. Cook seafood, meat, poultry, and eggs to the recommended temperature. Get a food thermometer. Store foods at safe temperatures. In general: Keep cold foods at 84F (4.4C) or below. Keep hot foods at 184F (60C) or above. Keep your freezer at [facility] (-17.8C) or below. Foods are not safe to eat if they have been between the temperatures of 40-184F (4.4-60C) for more than 2 hours. What foods should I eat? Fruits Aim to eat 1-2 cups of fresh, canned (in natural juice), or frozen fruits each day. One cup of fruit equals 1 small apple, 1 large banana, 8 large strawberries, 1 cup (237 g) canned fruit,  cup (82 g) dried fruit, or 1 cup (240 mL) 100% juice. Vegetables Aim to eat 2-4 cups of fresh and frozen vegetables each day, including different varieties and colors. One cup of vegetables equals 1 cup (91 g) broccoli or cauliflower florets, 2 medium carrots, 2 cups (150 g) raw, leafy greens, 1 large tomato, 1 large bell pepper, 1 large sweet potato, or 1 medium white potato. Grains Aim to eat 5-10 ounce-equivalents of whole grains each day. Examples of 1 ounce-equivalent of grains include 1 slice of bread, 1 cup (40 g) ready-to-eat cereal, 3 cups (24 g) popcorn, or  cup (93 g) cooked rice. Meats and other proteins Try to eat 5-7 ounce-equivalents of protein each day. Examples of 1 ounce-equivalent of protein include 1 egg,  oz nuts (12 almonds, 24 pistachios, or 7 walnut halves), 1/4 cup (90 g) cooked beans, 6 tablespoons (90 g) hummus or 1 tablespoon (16 g) peanut butter. A cut of meat or fish that is the size of a deck of  cards is about 3-4 ounce-equivalents (85 g). Of the protein you eat each week, try to have at least 8 sounce (227 g) of seafood. This is about 2 servings per week. This includes salmon, trout, herring, sardines, and anchovies. Dairy Aim to eat 3 cup-equivalents of fat-free or low-fat dairy each day. Examples of 1 cup-equivalent of dairy include 1 cup (240 mL) milk, 8 ounces (250 g) yogurt, 1 ounces (44 g) natural cheese, or 1 cup (240 mL) fortified soy milk. Fats and oils Aim for about 5 teaspoons (21 g) of fats and oils per day. Choose monounsaturated fats, such as canola and olive oils, mayonnaise made with olive oil or avocado oil, avocados, peanut butter, and most nuts, or polyunsaturated fats, such as sunflower, corn, and soybean oils, walnuts, pine nuts, sesame seeds, sunflower seeds, and flaxseed. Beverages Aim for 6 eight-ounce glasses of water  per day. Limit coffee to 3-5 eight-ounce cups per day. Limit caffeinated beverages that have added calories, such as soda and energy drinks. If you drink alcohol: Limit how much you have to: 0-1 drink a day if you are female. 0-2 drinks a day if you are female. Know how much alcohol is in your drink. In the [place] one drink is one 12 oz bottle of beer (355 mL), one 5 oz glass of wine (  148 mL), or one 1 oz glass of hard liquor (44 mL). Seasoning and other foods Try not to add too much salt to your food. Try using herbs and spices instead of salt. Try not to add sugar to food. This information is based on [place] nutrition guidelines. To learn more, visit [employer]posable[employer].be. Exact amounts may vary. You may need different amounts. This information is not intended to replace advice given to you by your health care provider. Make sure you discuss any questions you have with your health care provider. Document Revised: 08/13/2022 Document Reviewed: 08/13/2022 Elsevier Patient Education  2024 [employer].

## 2024-08-10 ENCOUNTER — Telehealth: Payer: Self-pay | Admitting: Nurse Practitioner

## 2024-08-10 ENCOUNTER — Encounter: Admitting: Nurse Practitioner

## 2024-08-10 DIAGNOSIS — G629 Polyneuropathy, unspecified: Secondary | ICD-10-CM

## 2024-08-10 DIAGNOSIS — Z23 Encounter for immunization: Secondary | ICD-10-CM

## 2024-08-10 DIAGNOSIS — J4520 Mild intermittent asthma, uncomplicated: Secondary | ICD-10-CM

## 2024-08-10 DIAGNOSIS — Z0000 Encounter for general adult medical examination without abnormal findings: Secondary | ICD-10-CM

## 2024-08-10 DIAGNOSIS — E039 Hypothyroidism, unspecified: Secondary | ICD-10-CM

## 2024-08-10 DIAGNOSIS — E559 Vitamin D deficiency, unspecified: Secondary | ICD-10-CM

## 2024-08-10 DIAGNOSIS — Z780 Asymptomatic menopausal state: Secondary | ICD-10-CM

## 2024-08-10 DIAGNOSIS — D329 Benign neoplasm of meninges, unspecified: Secondary | ICD-10-CM

## 2024-08-10 DIAGNOSIS — E782 Mixed hyperlipidemia: Secondary | ICD-10-CM

## 2024-08-10 NOTE — Telephone Encounter (Signed)
 Called patient using the interpreter services and left vm for her to call back and get rescheduled

## 2024-09-05 NOTE — Patient Instructions (Incomplete)
 Be Involved in Caring For Your Health:  Taking Medications When medications are taken as directed, they can greatly improve your health. But if they are not taken as prescribed, they may not work. In some cases, not taking them correctly can be harmful. To help ensure your treatment remains effective and safe, understand your medications and how to take them. Bring your medications to each visit for review by your provider.  Your lab results, notes, and after visit summary will be available on My Chart. We strongly encourage you to use this feature. If lab results are abnormal the clinic will contact you with the appropriate steps. If the clinic does not contact you assume the results are satisfactory. You can always view your results on My Chart. If you have questions regarding your health or results, please contact the clinic during office hours. You can also ask questions on My Chart.  We at [facility] are grateful that you chose [place] to provide your care. We strive to provide evidence-based and compassionate care and are always looking for feedback. If you get a survey from the clinic please complete this so we can hear your opinions.  Heart-Healthy Eating Plan Many factors influence your heart health, including eating and exercise habits. Heart health is also called coronary health. Coronary risk increases with abnormal blood fat (lipid) levels. A heart-healthy eating plan includes limiting unhealthy fats, increasing healthy fats, limiting salt (sodium) intake, and making other diet and lifestyle changes. What is my plan? Your health care provider may recommend that: You limit your fat intake to _________% or less of your total calories each day. You limit your saturated fat intake to _________% or less of your total calories each day. You limit the amount of cholesterol in your diet to less than _________ mg per day. You limit the amount of sodium in your diet to less than _________  mg per day. What are tips for following this plan? Cooking Cook foods using methods other than frying. Baking, boiling, grilling, and broiling are all good options. Other ways to reduce fat include: Removing the skin from poultry. Removing all visible fats from meats. Steaming vegetables in water or broth. Meal planning  At meals, imagine dividing your plate into fourths: Fill one-half of your plate with vegetables and green salads. Fill one-fourth of your plate with whole grains. Fill one-fourth of your plate with lean protein foods. Eat 2-4 cups of vegetables per day. One cup of vegetables equals 1 cup (91 g) broccoli or cauliflower florets, 2 medium carrots, 1 large bell pepper, 1 large sweet potato, 1 large tomato, 1 medium white potato, 2 cups (150 g) raw leafy greens. Eat 1-2 cups of fruit per day. One cup of fruit equals 1 small apple, 1 large banana, 1 cup (237 g) mixed fruit, 1 large orange,  cup (82 g) dried fruit, 1 cup (240 mL) 100% fruit juice. Eat more foods that contain soluble fiber. Examples include apples, broccoli, carrots, beans, peas, and barley. Aim to get 25-30 g of fiber per day. Increase your consumption of legumes, nuts, and seeds to 4-5 servings per week. One serving of dried beans or legumes equals  cup (90 g) cooked, 1 serving of nuts is  oz (12 almonds, 24 pistachios, or 7 walnut halves), and 1 serving of seeds equals  oz (8 g). Fats Choose healthy fats more often. Choose monounsaturated and polyunsaturated fats, such as olive and canola oils, avocado oil, flaxseeds, walnuts, almonds, and seeds. Eat  more omega-3 fats. Choose salmon, mackerel, sardines, tuna, flaxseed oil, and ground flaxseeds. Aim to eat fish at least 2 times each week. Check food labels carefully to identify foods with trans fats or high amounts of saturated fat. Limit saturated fats. These are found in animal products, such as meats, butter, and cream. Plant sources of saturated fats  include palm oil, palm kernel oil, and coconut oil. Avoid foods with partially hydrogenated oils in them. These contain trans fats. Examples are stick margarine, some tub margarines, cookies, crackers, and other baked goods. Avoid fried foods. General information Eat more home-cooked food and less restaurant, buffet, and fast food. Limit or avoid alcohol. Limit foods that are high in added sugar and simple starches such as foods made using white refined flour (white breads, pastries, sweets). Lose weight if you are overweight. Losing just 5-10% of your body weight can help your overall health and prevent diseases such as diabetes and heart disease. Monitor your sodium intake, especially if you have high blood pressure. Talk with your health care provider about your sodium intake. Try to incorporate more vegetarian meals weekly. What foods should I eat? Fruits All fresh, canned (in natural juice), or frozen fruits. Vegetables Fresh or frozen vegetables (raw, steamed, roasted, or grilled). Green salads. Grains Most grains. Choose whole wheat and whole grains most of the time. Rice and pasta, including brown rice and pastas made with whole wheat. Meats and other proteins Lean, well-trimmed beef, veal, pork, and lamb. Chicken and [place] without skin. All fish and shellfish. Wild duck, rabbit, pheasant, and venison. Egg whites or low-cholesterol egg substitutes. Dried beans, peas, lentils, and tofu. Seeds and most nuts. Dairy Low-fat or nonfat cheeses, including ricotta and mozzarella. Skim or 1% milk (liquid, powdered, or evaporated). Buttermilk made with low-fat milk. Nonfat or low-fat yogurt. Fats and oils Non-hydrogenated (trans-free) margarines. Vegetable oils, including soybean, sesame, sunflower, olive, avocado, peanut, safflower, corn, canola, and cottonseed. Salad dressings or mayonnaise made with a vegetable oil. Beverages Water (mineral or sparkling). Coffee and tea. Unsweetened ice  tea. Diet beverages. Sweets and desserts Sherbet, gelatin, and fruit ice. Small amounts of dark chocolate. Limit all sweets and desserts. Seasonings and condiments All seasonings and condiments. The items listed above may not be a complete list of foods and beverages you can eat. Contact a dietitian for more options. What foods should I avoid? Fruits Canned fruit in heavy syrup. Fruit in cream or butter sauce. Fried fruit. Limit coconut. Vegetables Vegetables cooked in cheese, cream, or butter sauce. Fried vegetables. Grains Breads made with saturated or trans fats, oils, or whole milk. Croissants. Sweet rolls. Donuts. High-fat crackers, such as cheese crackers and chips. Meats and other proteins Fatty meats, such as hot dogs, ribs, sausage, bacon, rib-eye roast or steak. High-fat deli meats, such as salami and bologna. Caviar. Domestic duck and goose. Organ meats, such as liver. Dairy Cream, sour cream, cream cheese, and creamed cottage cheese. Whole-milk cheeses. Whole or 2% milk (liquid, evaporated, or condensed). Whole buttermilk. Cream sauce or high-fat cheese sauce. Whole-milk yogurt. Fats and oils Meat fat, or shortening. Cocoa butter, hydrogenated oils, palm oil, coconut oil, palm kernel oil. Solid fats and shortenings, including bacon fat, salt pork, lard, and butter. Nondairy cream substitutes. Salad dressings with cheese or sour cream. Beverages Regular sodas and any drinks with added sugar. Sweets and desserts Frosting. Pudding. Cookies. Cakes. Pies. Milk chocolate or white chocolate. Buttered syrups. Full-fat ice cream or ice cream drinks. The items listed above may  not be a complete list of foods and beverages to avoid. Contact a dietitian for more information. Summary Heart-healthy meal planning includes limiting unhealthy fats, increasing healthy fats, limiting salt (sodium) intake and making other diet and lifestyle changes. Lose weight if you are overweight. Losing just  5-10% of your body weight can help your overall health and prevent diseases such as diabetes and heart disease. Focus on eating a balance of foods, including fruits and vegetables, low-fat or nonfat dairy, lean protein, nuts and legumes, whole grains, and heart-healthy oils and fats. This information is not intended to replace advice given to you by your health care provider. Make sure you discuss any questions you have with your health care provider. Document Revised: 12/18/2021 Document Reviewed: 12/18/2021 [employer] Patient Education  2024 [employer].

## 2024-09-08 ENCOUNTER — Encounter: Payer: Self-pay | Admitting: Nurse Practitioner

## 2024-09-08 ENCOUNTER — Ambulatory Visit (INDEPENDENT_AMBULATORY_CARE_PROVIDER_SITE_OTHER): Admitting: Nurse Practitioner

## 2024-09-08 VITALS — BP 126/76 | HR 90 | Temp 98.4°F | Resp 15 | Ht <= 58 in | Wt 123.0 lb

## 2024-09-08 DIAGNOSIS — K582 Mixed irritable bowel syndrome: Secondary | ICD-10-CM | POA: Diagnosis not present

## 2024-09-08 DIAGNOSIS — Z23 Encounter for immunization: Secondary | ICD-10-CM

## 2024-09-08 DIAGNOSIS — E559 Vitamin D deficiency, unspecified: Secondary | ICD-10-CM | POA: Diagnosis not present

## 2024-09-08 DIAGNOSIS — G629 Polyneuropathy, unspecified: Secondary | ICD-10-CM

## 2024-09-08 DIAGNOSIS — G8929 Other chronic pain: Secondary | ICD-10-CM

## 2024-09-08 DIAGNOSIS — M5441 Lumbago with sciatica, right side: Secondary | ICD-10-CM

## 2024-09-08 DIAGNOSIS — D329 Benign neoplasm of meninges, unspecified: Secondary | ICD-10-CM | POA: Diagnosis not present

## 2024-09-08 DIAGNOSIS — Z780 Asymptomatic menopausal state: Secondary | ICD-10-CM

## 2024-09-08 DIAGNOSIS — Z0000 Encounter for general adult medical examination without abnormal findings: Secondary | ICD-10-CM | POA: Diagnosis not present

## 2024-09-08 DIAGNOSIS — E782 Mixed hyperlipidemia: Secondary | ICD-10-CM

## 2024-09-08 DIAGNOSIS — J4520 Mild intermittent asthma, uncomplicated: Secondary | ICD-10-CM

## 2024-09-08 DIAGNOSIS — E039 Hypothyroidism, unspecified: Secondary | ICD-10-CM | POA: Diagnosis not present

## 2024-09-08 MED ORDER — FLUTICASONE PROPIONATE 50 MCG/ACT NA SUSP: 2.0000 | Freq: Every day | NASAL | 6 refills | Status: AC

## 2024-09-08 MED ORDER — CETIRIZINE HCL 10 MG PO TABS: 10.0000 mg | ORAL_TABLET | Freq: Every day | ORAL | 4 refills | Status: AC

## 2024-09-08 MED ORDER — GABAPENTIN 300 MG PO CAPS: 300.0000 mg | ORAL_CAPSULE | Freq: Every day | ORAL | 4 refills | Status: AC

## 2024-09-08 MED ORDER — ROSUVASTATIN CALCIUM 20 MG PO TABS: 20.0000 mg | ORAL_TABLET | Freq: Every day | ORAL | 4 refills | Status: AC

## 2024-09-08 NOTE — Assessment & Plan Note (Signed)
 History of low levels reported, check today and start supplement as needed which would benefit bone health. DEXA ordered.

## 2024-09-08 NOTE — Assessment & Plan Note (Signed)
Refer to low back pain plan of care. 

## 2024-09-08 NOTE — Assessment & Plan Note (Signed)
 Stable.  Continue Gabapentin  100 MG daily for back and hip pain + Voltaren  gel and daily stretching.  May alternate heat and ice + continue Tylenol  as needed.  Consider PT referral if worsening pain.

## 2024-09-08 NOTE — Assessment & Plan Note (Signed)
 Stable.  Noted on imaging at [facility] 04/16/23.  She reports this has been present since she lived in [place] many years ago, no records for this to review. Currently no neuro symptoms.  She saw neurology, recent note reviewed.  Reports she will only return there as needed.

## 2024-09-08 NOTE — Progress Notes (Signed)
 BP 126/76 (BP Location: Left Arm, Patient Position: Sitting, Cuff Size: Normal)   Pulse 90   Temp 98.4 F (36.9 C) (Oral)   Resp 15   Ht 4' 9.99 (1.473 m)   Wt 123 lb (55.8 kg)   LMP  (LMP Unknown)   SpO2 98%   BMI 25.71 kg/m    Subjective:    Patient ID: Katherine [name], female    DOB: 1956-04-08, 68 y.o.   MRN: [id number]  HPI: Katherine [name] is a 68 y.o. female presenting on 09/08/2024 for Welcome to Medicare visit. Current medical complaints include:none  She currently lives with: Menopausal Symptoms: no  Takes Gabapentin  at night for back pain. Last saw neurology last 06/27/23 for meningioma, to return as needed.   HYPERLIPIDEMIA Takes Crestor  daily. Hyperlipidemia status: good compliance Satisfied with current treatment?  yes Side effects:  no Medication compliance: good compliance Past cholesterol meds: Crestor  Supplements: none Aspirin :  no The ASCVD Risk score (Arnett DK, et al., 2019) failed to calculate for the following reasons:   Risk score cannot be calculated because patient has a medical history suggesting prior/existing ASCVD Chest pain:  no Coronary artery disease:  no Family history CAD:  no Family history early CAD:  no    ASTHMA Continues to take Zyrtec , but needs refills on nasal spray. Asthma status: stable Satisfied with current treatment?: yes Albuterol /rescue inhaler frequency: at night sometimes Dyspnea frequency: no Wheezing frequency: no Cough frequency: occasional Nocturnal symptom frequency: no Limitation of activity: no Current upper respiratory symptoms: no Triggers: pollen Aerochamber/spacer use: no Visits to ER or Urgent Care in past year: no Pneumovax: Up To Date Influenza: Provided today   HYPOTHYROIDISM  Takes Levothyroxine  dose 50 MCG. Reports issues with dry eyes, sees eye doctor.  Currently uses OTC drops but they do not help. Thyroid  control status:stable  Satisfied with current treatment? yes  Medication side  effects: no  Medication compliance: good compliance  Etiology of hypothyroidism: unknown Recent dose adjustment:no  Fatigue: none Cold intolerance: at times Heat intolerance: none Weight gain: none Weight loss: none Constipation:none Diarrhea/loose stools: none Palpitations: none Lower extremity edema: none Anxiety/depressed mood: none  Depression Screen done today and results listed below:     09/08/2024    3:41 PM 02/04/2024   10:28 AM 08/02/2023   10:56 AM 05/20/2023    3:36 PM 01/30/2023    1:55 PM  Depression screen PHQ 2/9  Decreased Interest 0 0 0 0 0  Down, Depressed, Hopeless 0 0 0 0 0  PHQ - 2 Score 0 0 0 0 0  Altered sleeping 0 0 0 1 0  Tired, decreased energy 0 0 0 2 0  Change in appetite 0 0 0 1 0  Feeling bad or failure about yourself  0 0 0 0 0  Trouble concentrating 0 0 0 0 0  Moving slowly or fidgety/restless 0 0 0 0 0  Suicidal thoughts 0 0 0 0 0  PHQ-9 Score 0 0 0 4 0  Difficult doing work/chores  Not difficult at all Not difficult at all        09/08/2024    3:41 PM 02/04/2024   10:28 AM 08/02/2023   10:56 AM 05/20/2023    3:37 PM  GAD 7 : Generalized Anxiety Score  Nervous, Anxious, on Edge 0 0 0 0  Control/stop worrying 0 0 0 0  Worry too much - different things 0 0 0 0  Trouble relaxing 0 0 0 0  Restless 0 0 0 0  Easily annoyed or irritable 0 0 0 0  Afraid - awful might happen 0 0 0 0  Total GAD 7 Score 0 0 0 0  Anxiety Difficulty  Not difficult at all Not difficult at all Not difficult at all        01/05/2022   10:39 AM 01/30/2023    1:55 PM 08/02/2023   10:56 AM 02/04/2024   10:28 AM 09/08/2024    3:15 PM  Fall Risk  Falls in the past year? 0 0 0 0 0  Was there an injury with Fall? 0 0 0 0 0  Fall Risk Category Calculator 0 0 0 0 0  Fall Risk Category (Retired) Low       (RETIRED) Patient Fall Risk Level Low fall risk       Patient at Risk for Falls Due to No Fall Risks No Fall Risks No Fall Risks No Fall Risks No Fall Risks  Fall risk  Follow up Falls prevention discussed  Falls prevention discussed Falls evaluation completed Falls evaluation completed Falls evaluation completed     Data saved with a previous flowsheet row definition    Functional Status Survey: Is the patient deaf or have difficulty hearing?: No Does the patient have difficulty seeing, even when wearing glasses/contacts?: Yes (Wears glasses) Does the patient have difficulty concentrating, remembering, or making decisions?: No Does the patient have difficulty walking or climbing stairs?: Yes (Sometimes) Does the patient have difficulty dressing or bathing?: No Does the patient have difficulty doing errands alone such as visiting a doctor's office or shopping?: No   Past Medical History:  Past Medical History:  Diagnosis Date   Anemia    Arthritis    joints   Carpal tunnel syndrome    right arm   Dyspnea    GERD (gastroesophageal reflux disease)    Hypothyroidism    IBS (irritable bowel syndrome)    Menopause    Motion sickness    Wears partial dentures    upper    Surgical History:  Past Surgical History:  Procedure Laterality Date   APPENDECTOMY     COLONOSCOPY WITH PROPOFOL  N/A 01/01/2018   Procedure: COLONOSCOPY WITH PROPOFOL ;  Surgeon: Unk Corinn Skiff, MD;  Location: [facility];  Service: Endoscopy;  Laterality: N/A;  speaks Laotian   ROTATOR CUFF REPAIR Right 11/18/2020   UPPER GI ENDOSCOPY      Medications:  Current Outpatient Medications on File Prior to Visit  Medication Sig   albuterol  (VENTOLIN  HFA) 108 (90 Base) MCG/ACT inhaler Inhale 2 puffs into the lungs every 6 (six) hours as needed for wheezing or shortness of breath.   cyanocobalamin (VITAMIN B12) 1000 MCG tablet Take 1,000 mcg by mouth daily.   ferrous sulfate 325 (65 FE) MG EC tablet Take 325 mg by mouth daily with breakfast.   levothyroxine  (SYNTHROID ) 50 MCG tablet Take 1 tablet (50 mcg total) by mouth daily.   magnesium  oxide (MAG-OX) 400 MG tablet  Take 400 mg by mouth daily.   No current facility-administered medications on file prior to visit.    Allergies:  Allergies  Allergen Reactions   Dorethia Ares ] Other (See Comments)    Makes blood thin    Social History:  Social History   Socioeconomic History   Marital status: Married    Spouse name: Not on file   Number of children: Not on file   Years of education: Not on file   Highest education  level: Not on file  Occupational History   Not on file  Tobacco Use   Smoking status: Never   Smokeless tobacco: Never  Vaping Use   Vaping status: Never Used  Substance and Sexual Activity   Alcohol use: No    Alcohol/week: 0.0 standard drinks of alcohol    Comment: rarely   Drug use: No   Sexual activity: Never  Other Topics Concern   Not on file  Social History Narrative   Not on file   Social Drivers of Health   Financial Resource Strain: High Risk (07/16/2019)   Overall Financial Resource Strain (CARDIA)    Difficulty of Paying Living Expenses: Very hard  Food Insecurity: No Food Insecurity (07/28/2019)   Received from [facility]   Hunger Vital Sign    Within the past 12 months, you worried that your food would run out before you got the money to buy more.: Never true    Within the past 12 months, the food you bought just didn't last and you didn't have money to get more.: Never true  Recent Concern: Food Insecurity - Food Insecurity Present (07/16/2019)   Hunger Vital Sign    Worried About Running Out of Food in the Last Year: Often true    Ran Out of Food in the Last Year: Often true  Transportation Needs: No Transportation Needs (07/28/2019)   Received from [facility]   PRAPARE - Transportation    Lack of Transportation (Medical): No    Lack of Transportation (Non-Medical): No  Physical Activity: Insufficiently Active (07/16/2019)   Exercise Vital Sign    Days of Exercise per Week: 4 days    Minutes of Exercise per Session: 20 min  Stress: Stress  Concern Present (07/16/2019)   [employer] of Occupational Health - Occupational Stress Questionnaire    Feeling of Stress : To some extent  Social Connections: Moderately Isolated (07/16/2019)   Social Connection and Isolation Panel    Frequency of Communication with Friends and Family: More than three times a week    Frequency of Social Gatherings with Friends and Family: More than three times a week    Attends Religious Services: Never    Database administrator or Organizations: No    Attends Banker Meetings: Never    Marital Status: Married  Catering manager Violence: Not At Risk (07/16/2019)   Humiliation, Afraid, Rape, and Kick questionnaire    Fear of Current or Ex-Partner: No    Emotionally Abused: No    Physically Abused: No    Sexually Abused: No   Social History   Tobacco Use  Smoking Status Never  Smokeless Tobacco Never   Social History   Substance and Sexual Activity  Alcohol Use No   Alcohol/week: 0.0 standard drinks of alcohol   Comment: rarely    Family History:  Family History  Problem Relation Age of Onset   Breast cancer Neg Hx    Past medical history, surgical history, medications, allergies, family history and social history reviewed with patient today and changes made to appropriate areas of the chart.   ROS All other ROS negative except what is listed above and in the HPI.      Objective:    BP 126/76 (BP Location: Left Arm, Patient Position: Sitting, Cuff Size: Normal)   Pulse 90   Temp 98.4 F (36.9 C) (Oral)   Resp 15   Ht 4' 9.99 (1.473 m)   Wt 123 lb (  55.8 kg)   LMP  (LMP Unknown)   SpO2 98%   BMI 25.71 kg/m   Wt Readings from Last 3 Encounters:  09/08/24 123 lb (55.8 kg)  05/02/24 122 lb 12.7 oz (55.7 kg)  02/04/24 122 lb 12.8 oz (55.7 kg)    Physical Exam Vitals and nursing note reviewed. Exam conducted with a chaperone present.  Constitutional:      General: She is awake. She is not in acute distress.     Appearance: She is well-developed and well-groomed. She is not ill-appearing or toxic-appearing.  HENT:     Head: Normocephalic and atraumatic.     Right Ear: Hearing, tympanic membrane, ear canal and external ear normal. No drainage.     Left Ear: Hearing, tympanic membrane, ear canal and external ear normal. No drainage.     Nose: Nose normal.     Right Sinus: No maxillary sinus tenderness or frontal sinus tenderness.     Left Sinus: No maxillary sinus tenderness or frontal sinus tenderness.     Mouth/Throat:     Mouth: Mucous membranes are moist.     Pharynx: Oropharynx is clear. Uvula midline. No pharyngeal swelling, oropharyngeal exudate or posterior oropharyngeal erythema.  Eyes:     General: Lids are normal.        Right eye: No discharge.        Left eye: No discharge.     Extraocular Movements: Extraocular movements intact.     Conjunctiva/sclera: Conjunctivae normal.     Pupils: Pupils are equal, round, and reactive to light.     Visual Fields: Right eye visual fields normal and left eye visual fields normal.  Neck:     Thyroid : No thyromegaly.     Vascular: No carotid bruit.     Trachea: Trachea normal.  Cardiovascular:     Rate and Rhythm: Normal rate and regular rhythm.     Heart sounds: Normal heart sounds. No murmur heard.    No gallop.  Pulmonary:     Effort: Pulmonary effort is normal. No accessory muscle usage or respiratory distress.     Breath sounds: Normal breath sounds.  Chest:  Breasts:    Right: Normal.     Left: Normal.  Abdominal:     General: Bowel sounds are normal.     Palpations: Abdomen is soft. There is no hepatomegaly or splenomegaly.     Tenderness: There is no abdominal tenderness.  Musculoskeletal:        General: Normal range of motion.     Cervical back: Normal range of motion and neck supple.     Right lower leg: No edema.     Left lower leg: No edema.  Lymphadenopathy:     Head:     Right side of head: No submental, submandibular,  tonsillar, preauricular or posterior auricular adenopathy.     Left side of head: No submental, submandibular, tonsillar, preauricular or posterior auricular adenopathy.     Cervical: No cervical adenopathy.     Upper Body:     Right upper body: No supraclavicular, axillary or pectoral adenopathy.     Left upper body: No supraclavicular, axillary or pectoral adenopathy.  Skin:    General: Skin is warm and dry.     Capillary Refill: Capillary refill takes less than 2 seconds.     Findings: No rash.  Neurological:     Mental Status: She is alert and oriented to person, place, and time.     Gait: Gait  is intact.     Deep Tendon Reflexes: Reflexes are normal and symmetric.     Reflex Scores:      Brachioradialis reflexes are 2+ on the right side and 2+ on the left side.      Patellar reflexes are 2+ on the right side and 2+ on the left side. Psychiatric:        Attention and Perception: Attention normal.        Mood and Affect: Mood normal.        Speech: Speech normal.        Behavior: Behavior normal. Behavior is cooperative.        Thought Content: Thought content normal.        Judgment: Judgment normal.    EKG My review and personal interpretation at Time: 1600    Indication: Welcome to Medicare Rate: 84  Rhythm: sinus Axis: normal Other: No nonspecific st abn, no stemi, no lvh     09/08/2024    3:22 PM  6CIT Screen  What Year? 0 points  What month? 0 points  What time? 0 points  Count back from 20 0 points  Months in reverse 0 points  Repeat phrase 6 points  Total Score 6 points     Vision Screening   Right eye Left eye Both eyes  Without correction     With correction 20/10 20/20 20/20     Hearing Screen Results  20 dB HL   Right Ear Left Ear  500 Hz Pass [x]  Fail []  Pass [x]  Fail []   1,000 Hz Pass [x]  Fail []  Pass [x]  Fail []   2,000 Hz Pass [x]  Fail []  Pass [x]  Fail []   4,000 Hz Pass [x]  Fail []  Pass [x]  Fail []     25 dB HL   Right Ear Left Ear  500  Hz Pass [x]  Fail []  Pass [x]  Fail []   1,000 Hz Pass []  Fail [x]  Pass [x]  Fail []   2,000 Hz Pass []  Fail [x]  Pass [x]  Fail []   4,000 Hz Pass [x]  Fail []  Pass [x]  Fail []     40 dB HL   Right Ear Left Ear  500 Hz Pass [x]  Fail []  Pass [x]  Fail []   1,000 Hz Pass [x]  Fail []  Pass [x]  Fail []   2,000 Hz Pass [x]  Fail []  Pass [x]  Fail []   4,000 Hz Pass [x]  Fail []  Pass [x]  Fail []    Results for orders placed or performed during the hospital encounter of 05/02/24  Group A Strep by PCR   Collection Time: 05/02/24  3:21 PM   Specimen: Throat; Sterile Swab  Result Value Ref Range   Group A Strep by PCR NOT DETECTED NOT DETECTED      Assessment & Plan:   Problem List Items Addressed This Visit       Respiratory   Asthma   Chronic, stable.  Continue Albuterol  and adjust as needed.  Will continue Zyrtec  and Flonase, which she reports works best. Asthma triggered by environmental allergies.      Relevant Orders   CBC with Differential/Platelet     Endocrine   Hypothyroid   Chronic, ongoing.  Continue current medication regimen and adjust as needed.  Assess thyroid  labs today.      Relevant Orders   TSH   T4, free     Nervous and Auditory   Neuropathy   Refer to low back pain plan of care.      Relevant Orders   Magnesium   Vitamin B12   Meningioma (HCC)   Stable.  Noted on imaging at [facility] 04/16/23.  She reports this has been present since she lived in [place]  many years ago, no records for this to review. Currently no neuro symptoms.  She saw neurology, recent note reviewed.  Reports she will only return there as needed.        Other   Vitamin D  deficiency   History of low levels reported, check today and start supplement as needed which would benefit bone health. DEXA ordered.      Relevant Orders   VITAMIN D  25 Hydroxy (Vit-D Deficiency, Fractures)   Low back pain   Stable.  Continue Gabapentin  100 MG daily for back and hip pain + Voltaren  gel and daily stretching.   May alternate heat and ice + continue Tylenol  as needed.  Consider PT referral if worsening pain.      Hyperlipidemia   Chronic, ongoing.  Continue current medication regimen and adjust as needed.  Labs today.      Relevant Medications   rosuvastatin  (CRESTOR ) 20 MG tablet   Other Relevant Orders   Comprehensive metabolic panel with GFR   Lipid Panel w/o Chol/HDL Ratio   EKG 12-Lead   Other Visit Diagnoses       Welcome to Medicare preventive visit    -  Primary   Welcome to Medicare today.   Relevant Orders   EKG 12-Lead     Postmenopausal estrogen deficiency       DEXA ordered.   Relevant Orders   DG Bone Density     Flu vaccine need       Flu vaccine today, educated patient.        Follow up plan: Return in about 6 months (around 03/09/2025) for Annual Physical after 02/03/25.   LABORATORY TESTING:  - Pap smear: refuses, is 67 and previous paps negative, does not wish to continue  IMMUNIZATIONS:   - Tdap: Tetanus vaccination status reviewed: last tetanus booster within 10 years. - Influenza: Administered today - Pneumovax: Up to date - Prevnar: Up to date - COVID: Up to date - HPV: Not applicable - Shingrix  vaccine: will get at drug store  SCREENING: -Mammogram: Up to date  - Colonoscopy: Up to date  - Bone Density: Ordered today  -Hearing Test: Not applicable  -Spirometry: Not applicable   PATIENT COUNSELING:   Advised to take 1 mg of folate supplement per day if capable of pregnancy.   Sexuality: Discussed sexually transmitted diseases, partner selection, use of condoms, avoidance of unintended pregnancy  and contraceptive alternatives.   Advised to avoid cigarette smoking.  I discussed with the patient that most people either abstain from alcohol or drink within safe limits (<=14/week and <=4 drinks/occasion for males, <=7/weeks and <= 3 drinks/occasion for females) and that the risk for alcohol disorders and other health effects rises proportionally  with the number of drinks per week and how often a drinker exceeds daily limits.  Discussed cessation/primary prevention of drug use and availability of treatment for abuse.   Diet: Encouraged to adjust caloric intake to maintain  or achieve ideal body weight, to reduce intake of dietary saturated fat and total fat, to limit sodium intake by avoiding high sodium foods and not adding table salt, and to maintain adequate dietary potassium and calcium  preferably from fresh fruits, vegetables, and low-fat dairy products.    Stressed the importance of regular exercise  Injury prevention: Discussed safety belts, safety helmets, smoke detector,  smoking near bedding or upholstery.   Dental health: Discussed importance of regular tooth brushing, flossing, and dental visits.    NEXT PREVENTATIVE PHYSICAL DUE IN 1 YEAR. Return in about 6 months (around 03/09/2025) for Annual Physical after 02/03/25.

## 2024-09-08 NOTE — Assessment & Plan Note (Signed)
 Chronic, ongoing.  Continue current medication regimen and adjust as needed.  Labs today.

## 2024-09-08 NOTE — Assessment & Plan Note (Signed)
Chronic, ongoing.  Continue current medication regimen and adjust as needed.  Assess thyroid labs today. 

## 2024-09-08 NOTE — Assessment & Plan Note (Signed)
 Chronic, stable.  Continue Albuterol  and adjust as needed.  Will continue Zyrtec  and Flonase, which she reports works best. Asthma triggered by environmental allergies.

## 2024-09-09 ENCOUNTER — Ambulatory Visit: Payer: Self-pay | Admitting: Nurse Practitioner

## 2024-09-09 LAB — COMPREHENSIVE METABOLIC PANEL WITH GFR
ALT: 23 IU/L (ref 0–32)
AST: 19 IU/L (ref 0–40)
Albumin: 4.1 g/dL (ref 3.9–4.9)
Alkaline Phosphatase: 111 IU/L (ref 49–135)
BUN/Creatinine Ratio: 26 (ref 12–28)
BUN: 16 mg/dL (ref 8–27)
Bilirubin Total: 0.3 mg/dL (ref 0.0–1.2)
CO2: 23 mmol/L (ref 20–29)
Calcium: 8.9 mg/dL (ref 8.7–10.3)
Chloride: 102 mmol/L (ref 96–106)
Creatinine, Ser: 0.61 mg/dL (ref 0.57–1.00)
Globulin, Total: 3.1 g/dL (ref 1.5–4.5)
Glucose: 135 mg/dL — ABNORMAL HIGH (ref 70–99)
Potassium: 3.9 mmol/L (ref 3.5–5.2)
Sodium: 143 mmol/L (ref 134–144)
Total Protein: 7.2 g/dL (ref 6.0–8.5)
eGFR: 99 mL/min/1.73 (ref >59)

## 2024-09-09 LAB — CBC WITH DIFFERENTIAL/PLATELET
Basophils Absolute: 0.1 x10E3/uL (ref 0.0–0.2)
Basos: 1 %
EOS (ABSOLUTE): 0.2 x10E3/uL (ref 0.0–0.4)
Eos: 2 %
Hematocrit: 41.1 % (ref 34.0–46.6)
Hemoglobin: 13.3 g/dL (ref 11.1–15.9)
Immature Grans (Abs): 0 x10E3/uL (ref 0.0–0.1)
Immature Granulocytes: 0 %
Lymphocytes Absolute: 2.4 x10E3/uL (ref 0.7–3.1)
Lymphs: 33 %
MCH: 25.7 pg — ABNORMAL LOW (ref 26.6–33.0)
MCHC: 32.4 g/dL (ref 31.5–35.7)
MCV: 79 fL (ref 79–97)
Monocytes Absolute: 0.8 x10E3/uL (ref 0.1–0.9)
Monocytes: 10 %
Neutrophils Absolute: 3.9 x10E3/uL (ref 1.4–7.0)
Neutrophils: 54 %
Platelets: 272 x10E3/uL (ref 150–450)
RBC: 5.18 x10E6/uL (ref 3.77–5.28)
RDW: 13.9 % (ref 11.7–15.4)
WBC: 7.3 x10E3/uL (ref 3.4–10.8)

## 2024-09-09 LAB — LIPID PANEL W/O CHOL/HDL RATIO
Cholesterol, Total: 113 mg/dL (ref 100–199)
HDL: 49 mg/dL (ref >39)
LDL Chol Calc (NIH): 29 mg/dL (ref 0–99)
Triglycerides: 228 mg/dL — ABNORMAL HIGH (ref 0–149)
VLDL Cholesterol Cal: 35 mg/dL (ref 5–40)

## 2024-09-09 LAB — TSH: TSH: 2.05 u[IU]/mL (ref 0.450–4.500)

## 2024-09-09 LAB — MAGNESIUM: Magnesium: 2.2 mg/dL (ref 1.6–2.3)

## 2024-09-09 LAB — VITAMIN D 25 HYDROXY (VIT D DEFICIENCY, FRACTURES): Vit D, 25-Hydroxy: 34.1 ng/mL (ref 30.0–100.0)

## 2024-09-09 LAB — VITAMIN B12: Vitamin B-12: 1294 pg/mL — ABNORMAL HIGH (ref 232–1245)

## 2024-09-09 LAB — T4, FREE: Free T4: 1.25 ng/dL (ref 0.82–1.77)

## 2024-09-09 NOTE — Progress Notes (Signed)
 Good afternoon, please let Darcee know her labs have returned: - CBC shows no anemia or infection. Continue B12 supplement daily. - Continue Vitamin D  supplement for bone health, level is good. - Kidney and liver function normal. Glucose, sugar, is a little high though. Did you eat before lab visit? I not I may have you come in and check labs again in a couple weeks. - Remainder of labs look great. Continue all current medications.  Any questions? Keep being amazing!!  Thank you for allowing me to participate in your care.  I appreciate you. Kindest regards, Ellery Meroney

## 2024-12-30 ENCOUNTER — Other Ambulatory Visit: Payer: Self-pay | Admitting: Nurse Practitioner

## 2024-12-30 DIAGNOSIS — Z1231 Encounter for screening mammogram for malignant neoplasm of breast: Secondary | ICD-10-CM

## 2025-03-01 ENCOUNTER — Encounter

## 2025-03-01 ENCOUNTER — Other Ambulatory Visit

## 2025-03-09 ENCOUNTER — Encounter: Admitting: Nurse Practitioner
